# Patient Record
Sex: Female | Born: 1950 | Race: Black or African American | Hispanic: No | Smoking: Former smoker
Health system: Southern US, Community
[De-identification: ages and names within clinical notes are randomized; demographics above are authoritative.]

## PROBLEM LIST (undated history)

## (undated) DIAGNOSIS — J309 Allergic rhinitis, unspecified: Secondary | ICD-10-CM

## (undated) DIAGNOSIS — D1803 Hemangioma of intra-abdominal structures: Secondary | ICD-10-CM

## (undated) DIAGNOSIS — K589 Irritable bowel syndrome without diarrhea: Secondary | ICD-10-CM

## (undated) DIAGNOSIS — R0602 Shortness of breath: Secondary | ICD-10-CM

## (undated) DIAGNOSIS — R600 Localized edema: Secondary | ICD-10-CM

## (undated) DIAGNOSIS — Z860100 Personal history of colon polyps, unspecified: Secondary | ICD-10-CM

## (undated) DIAGNOSIS — R079 Chest pain, unspecified: Secondary | ICD-10-CM

## (undated) DIAGNOSIS — E559 Vitamin D deficiency, unspecified: Secondary | ICD-10-CM

## (undated) DIAGNOSIS — E119 Type 2 diabetes mellitus without complications: Secondary | ICD-10-CM

## (undated) DIAGNOSIS — Z8371 Family history of colonic polyps: Secondary | ICD-10-CM

## (undated) DIAGNOSIS — E669 Obesity, unspecified: Secondary | ICD-10-CM

## (undated) DIAGNOSIS — F329 Major depressive disorder, single episode, unspecified: Secondary | ICD-10-CM

## (undated) DIAGNOSIS — F419 Anxiety disorder, unspecified: Secondary | ICD-10-CM

## (undated) DIAGNOSIS — K449 Diaphragmatic hernia without obstruction or gangrene: Secondary | ICD-10-CM

## (undated) DIAGNOSIS — E876 Hypokalemia: Secondary | ICD-10-CM

## (undated) DIAGNOSIS — E039 Hypothyroidism, unspecified: Secondary | ICD-10-CM

## (undated) DIAGNOSIS — F32A Depression, unspecified: Secondary | ICD-10-CM

## (undated) DIAGNOSIS — Z6830 Body mass index (BMI) 30.0-30.9, adult: Secondary | ICD-10-CM

## (undated) DIAGNOSIS — F338 Other recurrent depressive disorders: Secondary | ICD-10-CM

## (undated) DIAGNOSIS — R609 Edema, unspecified: Secondary | ICD-10-CM

## (undated) DIAGNOSIS — H269 Unspecified cataract: Secondary | ICD-10-CM

## (undated) DIAGNOSIS — E785 Hyperlipidemia, unspecified: Secondary | ICD-10-CM

## (undated) DIAGNOSIS — I10 Essential (primary) hypertension: Secondary | ICD-10-CM

## (undated) DIAGNOSIS — K760 Fatty (change of) liver, not elsewhere classified: Secondary | ICD-10-CM

## (undated) DIAGNOSIS — R5383 Other fatigue: Secondary | ICD-10-CM

## (undated) DIAGNOSIS — I7300 Raynaud's syndrome without gangrene: Secondary | ICD-10-CM

## (undated) DIAGNOSIS — W5911XA Bitten by nonvenomous snake, initial encounter: Secondary | ICD-10-CM

## (undated) DIAGNOSIS — M8580 Other specified disorders of bone density and structure, unspecified site: Secondary | ICD-10-CM

## (undated) DIAGNOSIS — E079 Disorder of thyroid, unspecified: Secondary | ICD-10-CM

## (undated) DIAGNOSIS — H43391 Other vitreous opacities, right eye: Secondary | ICD-10-CM

## (undated) DIAGNOSIS — D126 Benign neoplasm of colon, unspecified: Secondary | ICD-10-CM

## (undated) HISTORY — PX: ROTATOR CUFF REPAIR: SHX139

## (undated) HISTORY — DX: Benign neoplasm of colon, unspecified: D12.6

## (undated) HISTORY — DX: Chest pain, unspecified: R07.9

## (undated) HISTORY — DX: Raynaud's syndrome without gangrene: I73.00

## (undated) HISTORY — DX: Obesity, unspecified: E66.9

## (undated) HISTORY — DX: Fatty (change of) liver, not elsewhere classified: K76.0

## (undated) HISTORY — DX: Edema, unspecified: R60.9

## (undated) HISTORY — DX: Personal history of colonic polyps: Z86.010

## (undated) HISTORY — DX: Other fatigue: R53.83

## (undated) HISTORY — DX: Other specified disorders of bone density and structure, unspecified site: M85.80

## (undated) HISTORY — DX: Hyperlipidemia, unspecified: E78.5

## (undated) HISTORY — DX: Hypomagnesemia: E83.42

## (undated) HISTORY — PX: CARPAL TUNNEL RELEASE: SHX101

## (undated) HISTORY — DX: Anxiety disorder, unspecified: F41.9

## (undated) HISTORY — PX: KNEE SURGERY: SHX244

## (undated) HISTORY — DX: Allergic rhinitis, unspecified: J30.9

## (undated) HISTORY — DX: Shortness of breath: R06.02

## (undated) HISTORY — DX: Type 2 diabetes mellitus without complications: E11.9

## (undated) HISTORY — DX: Other recurrent depressive disorders: F33.8

## (undated) HISTORY — DX: Hypokalemia: E87.6

## (undated) HISTORY — DX: Hypothyroidism, unspecified: E03.9

## (undated) HISTORY — DX: Localized edema: R60.0

## (undated) HISTORY — PX: CHOLECYSTECTOMY: SHX55

## (undated) HISTORY — DX: Body mass index (BMI) 30.0-30.9, adult: Z68.30

## (undated) HISTORY — DX: Hemangioma of intra-abdominal structures: D18.03

## (undated) HISTORY — PX: TUBAL LIGATION: SHX77

## (undated) HISTORY — DX: Irritable bowel syndrome without diarrhea: K58.9

## (undated) HISTORY — DX: Other vitreous opacities, right eye: H43.391

## (undated) HISTORY — DX: Vitamin D deficiency, unspecified: E55.9

## (undated) HISTORY — DX: Personal history of colon polyps, unspecified: Z86.0100

## (undated) HISTORY — DX: Family history of colonic polyps: Z83.71

## (undated) HISTORY — PX: ABDOMINAL HYSTERECTOMY: SHX81

## (undated) HISTORY — DX: Unspecified cataract: H26.9

## (undated) HISTORY — DX: Disorder of thyroid, unspecified: E07.9

---

## 1988-12-08 DIAGNOSIS — I10 Essential (primary) hypertension: Secondary | ICD-10-CM

## 1988-12-08 HISTORY — DX: Essential (primary) hypertension: I10

## 1996-12-08 DIAGNOSIS — E039 Hypothyroidism, unspecified: Secondary | ICD-10-CM | POA: Diagnosis present

## 1998-05-23 ENCOUNTER — Ambulatory Visit (HOSPITAL_COMMUNITY): Admission: RE | Admit: 1998-05-23 | Discharge: 1998-05-23 | Payer: Self-pay | Admitting: *Deleted

## 1998-05-25 ENCOUNTER — Ambulatory Visit (HOSPITAL_COMMUNITY): Admission: RE | Admit: 1998-05-25 | Discharge: 1998-05-25 | Payer: Self-pay | Admitting: *Deleted

## 1998-07-15 ENCOUNTER — Encounter (HOSPITAL_COMMUNITY): Admission: RE | Admit: 1998-07-15 | Discharge: 1998-10-04 | Payer: Self-pay | Admitting: *Deleted

## 1998-12-24 ENCOUNTER — Other Ambulatory Visit: Admission: RE | Admit: 1998-12-24 | Discharge: 1998-12-24 | Payer: Self-pay | Admitting: Oral Surgery

## 1999-01-05 ENCOUNTER — Ambulatory Visit (HOSPITAL_COMMUNITY): Admission: RE | Admit: 1999-01-05 | Discharge: 1999-01-05 | Payer: Self-pay | Admitting: *Deleted

## 1999-11-27 ENCOUNTER — Other Ambulatory Visit: Admission: RE | Admit: 1999-11-27 | Discharge: 1999-11-27 | Payer: Self-pay | Admitting: Obstetrics and Gynecology

## 2002-01-17 ENCOUNTER — Ambulatory Visit (HOSPITAL_COMMUNITY): Admission: RE | Admit: 2002-01-17 | Discharge: 2002-01-17 | Payer: Self-pay | Admitting: Gastroenterology

## 2003-10-09 ENCOUNTER — Encounter: Admission: RE | Admit: 2003-10-09 | Discharge: 2003-10-09 | Payer: Self-pay | Admitting: Family Medicine

## 2004-01-16 ENCOUNTER — Encounter: Admission: RE | Admit: 2004-01-16 | Discharge: 2004-04-15 | Payer: Self-pay | Admitting: Family Medicine

## 2004-05-30 ENCOUNTER — Inpatient Hospital Stay (HOSPITAL_COMMUNITY): Admission: EM | Admit: 2004-05-30 | Discharge: 2004-06-05 | Payer: Self-pay | Admitting: Emergency Medicine

## 2004-07-06 ENCOUNTER — Inpatient Hospital Stay (HOSPITAL_COMMUNITY): Admission: EM | Admit: 2004-07-06 | Discharge: 2004-07-12 | Payer: Self-pay | Admitting: Emergency Medicine

## 2004-07-10 ENCOUNTER — Encounter (INDEPENDENT_AMBULATORY_CARE_PROVIDER_SITE_OTHER): Payer: Self-pay | Admitting: *Deleted

## 2004-07-23 ENCOUNTER — Ambulatory Visit (HOSPITAL_COMMUNITY): Admission: RE | Admit: 2004-07-23 | Discharge: 2004-07-24 | Payer: Self-pay | Admitting: Surgery

## 2004-07-23 ENCOUNTER — Encounter (INDEPENDENT_AMBULATORY_CARE_PROVIDER_SITE_OTHER): Payer: Self-pay | Admitting: *Deleted

## 2005-04-25 ENCOUNTER — Inpatient Hospital Stay (HOSPITAL_COMMUNITY): Admission: EM | Admit: 2005-04-25 | Discharge: 2005-04-29 | Payer: Self-pay | Admitting: Emergency Medicine

## 2005-05-02 ENCOUNTER — Ambulatory Visit (HOSPITAL_COMMUNITY): Admission: RE | Admit: 2005-05-02 | Discharge: 2005-05-02 | Payer: Self-pay | Admitting: Gastroenterology

## 2006-01-14 ENCOUNTER — Encounter: Admission: RE | Admit: 2006-01-14 | Discharge: 2006-01-19 | Payer: Self-pay | Admitting: Family Medicine

## 2006-05-05 IMAGING — CR DG FOOT COMPLETE 3+V*R*
2 series · 2 of 2 positions shown · non-contrast
Comparison: none

CLINICAL DATA: Cellulitis. 
 RIGHT FOOT (THREE VIEWS)

[view not recorded (1 of 2)]
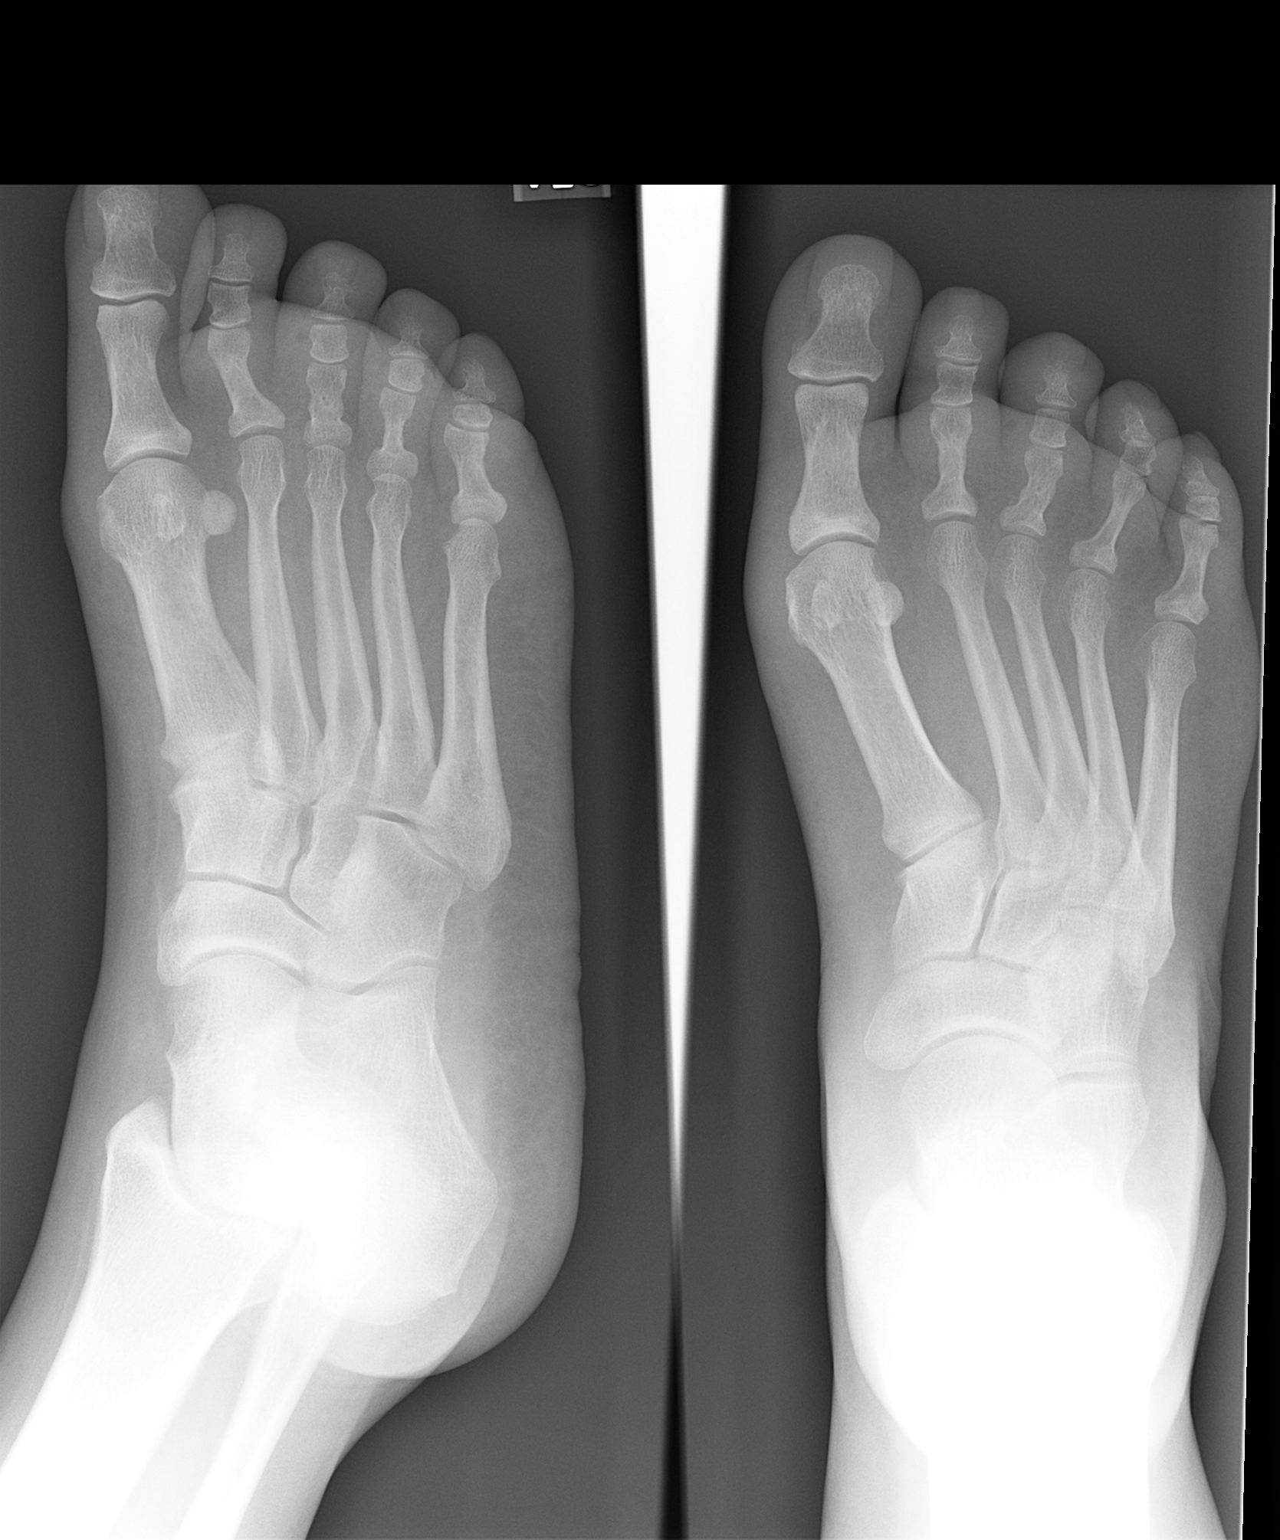

[view not recorded (2 of 2)]
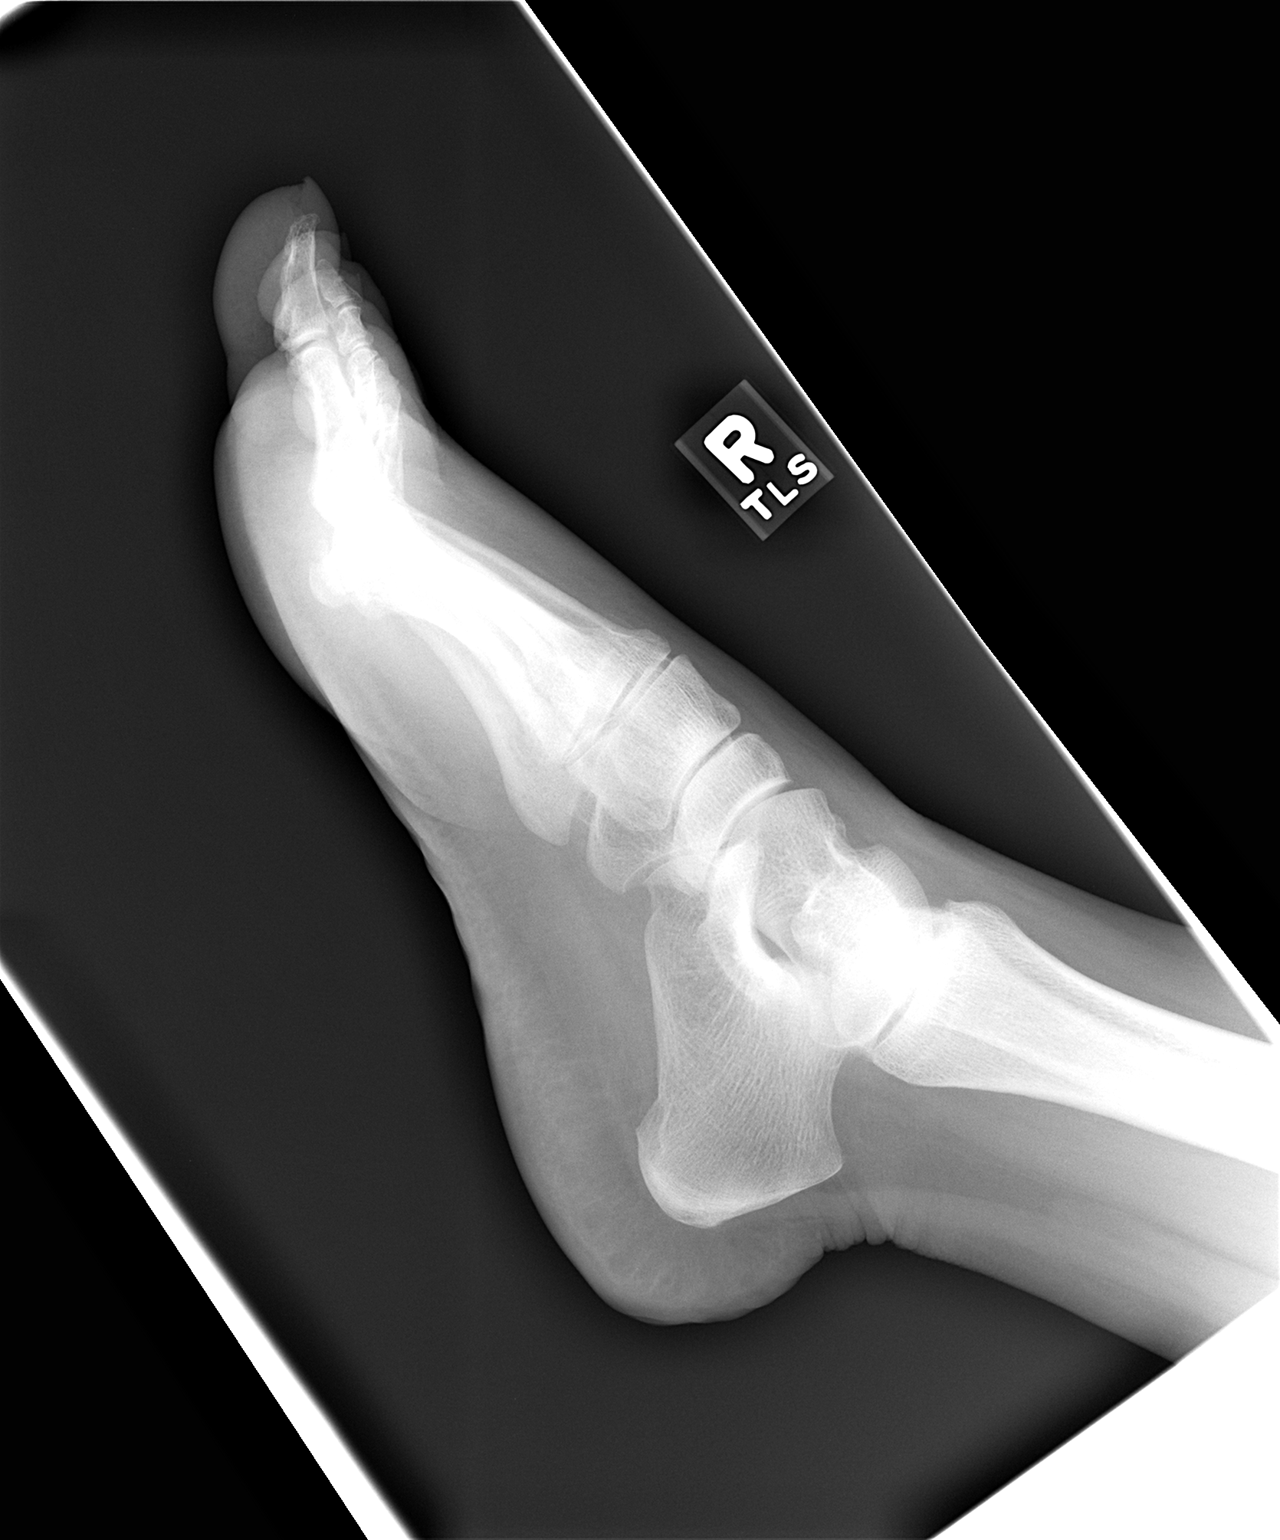

[2 of 2 positions shown; findings below may reference images not displayed]

FINDINGS: There is soft tissue swelling about the second and third digits with what looks like a small bubble of subcutaneous gas medial to the middle phalanx of the third toe.  No definite cortical destruction is identified to indicate osteomyelitis.  No bone abnormality is seen. 
 IMPRESSION
 Soft tissue swelling with question of subcutaneous gas involving the right third toe.  
 No bone abnormality to suggest osteomyelitis.  However MRI and bone scintigraphy are more sensitive studies for early osteomyelitis.

## 2007-01-12 ENCOUNTER — Encounter: Admission: RE | Admit: 2007-01-12 | Discharge: 2007-01-12 | Payer: Self-pay | Admitting: Gastroenterology

## 2007-02-17 ENCOUNTER — Encounter: Admission: RE | Admit: 2007-02-17 | Discharge: 2007-02-17 | Payer: Self-pay | Admitting: Gastroenterology

## 2007-03-22 ENCOUNTER — Ambulatory Visit (HOSPITAL_COMMUNITY): Admission: RE | Admit: 2007-03-22 | Discharge: 2007-03-22 | Payer: Self-pay | Admitting: Gastroenterology

## 2007-10-22 ENCOUNTER — Emergency Department (HOSPITAL_COMMUNITY): Admission: EM | Admit: 2007-10-22 | Discharge: 2007-10-22 | Payer: Self-pay | Admitting: Emergency Medicine

## 2009-10-01 ENCOUNTER — Encounter: Admission: RE | Admit: 2009-10-01 | Discharge: 2009-10-25 | Payer: Self-pay | Admitting: Family Medicine

## 2010-12-28 ENCOUNTER — Encounter: Payer: Self-pay | Admitting: Family Medicine

## 2011-04-25 NOTE — H&P (Signed)
NAME:  Alexis Molina, Alexis Molina              ACCOUNT NO.:  [id number]   MEDICAL RECORD NO.:  [id number]          PATIENT TYPE:  EMS   LOCATION:  ED                           FACILITY:  [facility]   PHYSICIAN:  Hollice Espy, M.D.DATE OF BIRTH:  May 26, 1951   DATE OF ADMISSION:  04/25/2005  DATE OF DISCHARGE:                                HISTORY & PHYSICAL   CHIEF COMPLAINT:  Abdominal pain with nausea and vomiting.   The patient is a 60 year old African-American female with a past medical  history of hypothyroidism, hypertension and diet controlled diabetes who was  in her usual state of health when she started having problems with nausea  and vomiting approximately earlier on in the morning. She tried some  Gaviscon with no relief and pain continued to persist. The pain was located  mostly in the mid epigastric area with radiation to the back and then  eventually migrated over to the right upper quadrant area. The patient  states the pain grew more and more intense with episodes of emesis which she  describes as 10/10. She has not had these type of symptoms since she had her  gallbladder taken out a few years ago. She finally could not take it  anymore, could not keep anything down and came to the emergency room for  further evaluation. Once there, her vitals were stable. She had lab work  which showed a normal white count of 9.9 but did have an 83% shift. Her  lipase level was normal as were her LFT's. She continued to complain of some  severe pain radiating to the back. she may have pancreatitis or a possible  renal stone and she underwent a CT of the abdomen and pelvis with contrast.  CT of the abdomen showed evidence of enteritis with a differential diagnosis  of infectious versus inflammatory versus hemorrhagic. The patient was given  zofran and morphine. Her abdominal pain subsided currently. She says when  she does not move she has no abdominal pain. She is still very nauseated  when  thinking about taking anything p.o. With these concerns, it was felt  that it would best to admit her for a 24 hour observation. She denies any  headaches, visual changes, dysphasia, chest pain, palpitations, shortness of  breath, wheeze, cough. She currently has no abdominal pain if she does not  move. She denies any constipation, diarrhea, hematuria or dysuria. She  denies any shortness of breath. Her review of systems is otherwise negative.   PAST MEDICAL HISTORY:  1.  Diabetes mellitus now diet controlled.  2.  Hypertension.  3.  Hypothyroidism.   MEDICATIONS:  The patient is on Synthroid, lisinopril, triamterene, Maxzide,  Lipitor and Zoloft.   ALLERGIES:  The patient has allergies reportedly to SULFA, PENICILLIN,  VICODIN and orange juice.   SOCIAL HISTORY:  She denied any tobacco, alcohol or drug use.   FAMILY HISTORY:  Noncontributory.   PHYSICAL EXAMINATION:  VITAL SIGNS:  The patient's vitals on admission,  temperature 98.5, heart rate 94, blood pressure 129/89, respirations 20, O2  sat 99% on room air.  GENERAL:  The patient is alert and oriented x3 in no apparent distress.  HEENT:  Normocephalic, atraumatic. Mucous membranes are dry. She has no  carotid bruits.  HEART:  Regular rate and rhythm, S1, S2.  LUNGS:  Clear to auscultation bilaterally.  ABDOMEN:  Soft, nontender unless she has deep palpation, nondistended,  positive bowel sounds.  EXTREMITIES:  No clubbing or cyanosis, trace pitting edema.   LABORATORY DATA:  White count of 9.9 with 83% neutrophils, H&H 13.9 and  41.4. MCV of 82, platelet count 323. Sodium 135, potassium 3.4, chloride 99,  bicarb 28, BUN 9, creatinine 0.8, glucose 132. LFT's are all within normal  limits. Lipase is normal at 26. Urinalysis is unremarkable. An ABG was drawn  but I think that the results were in error. The pH is 7.37, pCO2 of 50, PO2  of 60, bicarb of 28. Reported O2 sats of 89% although the patient has not  had any oxygen  desaturations and has her sats at 99% or greater on room air.   ASSESSMENT/PLAN:  1.  Enteritis likely acute infection. Will put the patient on IV fluids as      well as give antibiotics given that she does have bacterial shift. Will      choose Cipro and Flagyl for gut coverage. Likely would go for a three      day course. Will treat with IV Phenergan for nausea and start with a      clear liquid diet, advance as tolerated. If she is able to keep down a      regular diet by the end of the day, she could be discharged within the      next 24 hours.  2.  Hypothyroidism. Continue Synthroid.  3.  Depression. Continue Zoloft.  4.  Diabetes mellitus, diet controlled. Will keep an eye on her sugars.      SKK/MEDQ  D:  04/25/2005  T:  04/25/2005  Job:  [id number]   cc:   Caryn Bee L. Little, M.D.  [address]  [place]  [place] [address]  Fax: [phone]

## 2011-04-25 NOTE — Consult Note (Signed)
Alexis Molina, Alexis Molina              ACCOUNT NO.:  [id number]   MEDICAL RECORD NO.:  [id number]          PATIENT TYPE:  INP   LOCATION:  0349                         FACILITY:  [facility]   PHYSICIAN:  Petra Kuba, M.D.    DATE OF BIRTH:  1951-10-01   DATE OF CONSULTATION:  04/28/2005  DATE OF DISCHARGE:                                   CONSULTATION   HISTORY:  The patient seen at the request of Jackie Plum, M.D. for  multiple GI complaints. She does have an abnormal CAT scan which I reviewed  with Dwyane Luo. Fischer, M.D. She has had right sided abdominal pain, gas and  bloating after she eats for years, has been worked up in the past by Dr.  Randa Evens. She has had at least one colonoscopy, possibly two, what sounds  like an endoscopy and multiple CT scans as well as an empiric  cholecystectomy for biliary dyskinesia.   PAST MEDICAL HISTORY:  Pertinent for diabetes, hypertension, hypothyroidism.   MEDICATIONS:  Synthroid, lisinopril, triamterene, Maxzide, Lipitor and  Zoloft.   ALLERGIES:  SULFA, PENICILLIN and Vicodin.   SOCIAL HISTORY:  Denies tobacco, alcohol or drug use.   FAMILY HISTORY:  Negative although they all seem to have gas problems.   REVIEW OF SYMPTOMS:  Negative except above.   PHYSICAL EXAMINATION:  VITAL SIGNS:  See chart.  GENERAL:  No acute distress.  ABDOMEN:  Soft, nontender, good bowel sounds, decreased peripheral pulses  bilaterally.   LABORATORY DATA:  Unimpressive. Please see chart.   ASSESSMENT:  1.  Postprandial gas, bloating and pain for years. Questionable irritable      bowel syndrome.  2.  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   PLAN:  Would ask vascular surgery to see if a angiogram is needed and to see  if the celiac abnormality needs more workup. I will review her office chart  to see other workup plans and medicines trials but I believe she has not  been  on antispasmodic  and will try that. Probably okay for outpatient followup.  Probably could stand a small bowel followthrough to complete her GI workup  if she has not had one which I will check the office chart for. Will follow  with you.      MEM/MEDQ  D:  04/28/2005  T:  04/28/2005  Job:  [id number]   cc:   Caryn Bee L. Little, M.D.  [address]  [place]  [place] [address]  Fax: [phone]   Jackie Plum, M.D.

## 2011-04-25 NOTE — Procedures (Signed)
[facility]  Patient:    Alexis Molina, Alexis Molina Visit Number: [id number] MRN: [id number]          Service Type: END Location: ENDO Attending Physician:  Orland Mustard Dictated by:   Llana Aliment. Randa Evens, M.D. Proc. Date: 01/17/02 Admit Date:  01/17/2002   CC:         Caryn Bee L. Little, M.D.   Procedure Report  PROCEDURE:  Colonoscopy.  MEDICATIONS:  Fentanyl 87.5 mcg, Versed 8 mg IV.  INDICATION:  Woman with history of irritable bowel.  She does have a strong family history of colon polyps in her mother.  She has not had any colonic evaluation.  For this reason, colonoscopy is performed.  DESCRIPTION OF PROCEDURE:  The procedure had been explained to the patient and consent obtained.  The patient in the left lateral decubitus position, the Olympus video colonoscope inserted and advanced under direct visualization. The prep was excellent.  We were able to advance to the cecum without difficulty.  The ileocecal valve and appendiceal orifice were seen.  The scope was withdrawn, and the cecum, ascending colon, hepatic flexure, transverse colon, splenic flexure, descending and sigmoid colon were seen well upon removal.  No polyps or other lesions were seen.  The scope was withdrawn down into the rectum.  The rectum was completely normal.  No polyps or other lesions were seen.  The patient tolerated the procedure well and was maintained on low-flow oxygen and pulse oximetry throughout the procedure.  ASSESSMENT:  Essentially normal colonoscopy with no evidence of polyps in this high risk patient.  I suspect she does in fact have irritable bowel disease.  PLAN:  We will go ahead and plan on repeating the procedure in five years and will see back in the office in 2-3 months. Dictated by:   Llana Aliment. Randa Evens, M.D. Attending Physician:  Orland Mustard DD:  01/17/02 TD:  01/17/02 Job: [id number]

## 2011-04-25 NOTE — Discharge Summary (Signed)
NAME:  Alexis Molina, Alexis Molina              ACCOUNT NO.:  [id number]   MEDICAL RECORD NO.:  [id number]          PATIENT TYPE:  INP   LOCATION:  0349                         [facility]ACILITY:  [facility]   PHYSICIAN:  Jackie Plum, M.D.DATE OF BIRTH:  [birth year]   DATE OF ADMISSION:  04/25/2005  DATE OF DISCHARGE:  04/29/2005                                 DISCHARGE SUMMARY   DISCHARGE DIAGNOSES:  1.  Abdominal pain, nausea and vomiting resolved, etiology unclear.  2.  History of diabetes, hypertension and hypothyroidism.   DISCHARGE MEDICATIONS:  1.  Levbid 1-2 before breakfast and dinner.  2.  Phenergan as needed.  3.  She is also to continue her previous medications.   ACTIVITY:  As tolerated.   The patient was asked to scheduled small bowel follow through as instructed.  She will see Dr. Clarene Duke in 2-3 weeks for followup. She is to see Dr. Randa Evens  of GI medicine. Also attends Dr. Hart Rochester of vascular surgery and Dr. Ewing Schlein of  GI medicine. The patient was admitted by Hollice Espy, M.D. after  presenting with abdominal pain with nausea and vomiting. She has had this  intermittent pain for years and actually had a colonoscopy. She had  intermittent abdominal pain for years for which she had a cholecystectomy  done with some improvement; however, her symptoms persisted. On admission by  Hollice Espy, M.D., the patient had a CT scan which showed enteritis  with differential diagnosis of infectious versus inflamatory versus  hemorrhagic etiology. She was hemodynamically stable and her abdomen was  soft without any adverse significant tenderness. She was admitted to the  hospitalist service and started on IV fluid supplementation and antibiotics  for possible bacterial etiology with Cipro and Flagyl. Her labs were  monitored. 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 She did not have any weight loss or diarrhea and was seen  in consultation by Dr. Hart Rochester who thought that it was unlikely to have a  relationship between her celiac stenosis and abdominal pain. With supportive  care, potassium did improve and on evaluation by GI medicine, she was now  prepared for discharge home with outpatient followup on Apr 29, 2005 and was  discharged home on the above medicines. The patient was discharged home in  stable satisfactory condition, pain under control according to last noted by  Hollice Espy, M.D. who discharged patient home.   DISCHARGE LABS:  Sodium 137, potassium 3.7, chloride 101, CO2 29, glucose  129, BUN 7, creatinine 0.9, calcium 8.8.     GO/MEDQ  D:  05/23/2005  T:  05/23/2005  Job:  [id number]

## 2011-04-25 NOTE — Op Note (Signed)
NAME:  Alexis Molina                        ACCOUNT NO.:  [id number]   MEDICAL RECORD NO.:  [id number]                   PATIENT TYPE:  OIB   LOCATION:  2899                                 FACILITY:  [facility]   PHYSICIAN:  Alexis Park. Daphine Molina, M.D.             DATE OF BIRTH:  1951-11-27   DATE OF PROCEDURE:  07/23/2004  DATE OF DISCHARGE:                                 OPERATIVE REPORT   PREOPERATIVE DIAGNOSIS:  Biliary dyskinesia.   POSTOPERATIVE DIAGNOSIS:  Biliary dyskinesia status post laparoscopic  cholecystectomy with intraoperative cholangiogram   PROCEDURE:  Laparoscopic cholecystectomy and intraoperative cholangiogram.   SURGEON:  Alexis Park. Daphine Molina, M.D.   Threasa HeadsEarlene Molina   ANESTHESIA:  General endotracheal.   INDICATIONS:  Alexis Molina is a 60 year old lady seen over at [facility]  where she was recently admitted with some upper abdominal pain.  This has  persisted.  While she was admitted over there, she had a normal ultrasound  but a HIDA scan showing an ejection fraction of approximately 16% right  after stimulation.  Informed consent was obtained.  I was talking to her  first over there but mainly here about laparoscopic cholecystectomy and its  risks, benefits and complications not limited to common duct injury,  bleeding, bile leaks.   DESCRIPTION OF PROCEDURE:  The patient was back to room 17 and given general  anesthesia.  A Foley catheter was inserted, and she was given 400 mg of IV  Cipro.  After prepping with Betadine, I entered the abdomen via the  umbilicus by first excising a keloid down in the umbilicus making along the  way a vertical type incision.  I made the same kind of vertical incision in  the fascia and then used a Hasson cannula to enter bluntly.  The abdomen was  insufflated and three trocars were placed in the upper abdomen.   I surveyed the abdomen and did not see any other obvious problems.  The  gallbladder was grasped, elevated.   The cystic duct area and Calot's  triangle had kind of a wide firmness to it suggesting some chronic  inflammatory changes.  This could be related to her diabetes but it was not  normal.  I dissected off a Molina cystic artery off the duct, double clipped  that, divided it, and then put a clip up on the gallbladder.  I incised the  cystic duct and did a diamond cholangiogram using a Cook catheter, held in  place with a clip and then injecting several mL of dye leading to filling of  the common duct, filling of the intrahepatic radicals, and free flow into  the duodenum with a nice taper to the biliary ampulla.  The cystic duct was  triple clipped, divided.  The gallbladder was then removed from the  gallbladder bed without entering it using the hook electrocautery.  The  gallbladder bed was cauterized as we took  it out, and there were no bleeding  or bowel leaks noted.  I irrigated with copious amounts of saline at the  end, and no bleeding or polyps were noted.  The gallbladder was brought out  through the umbilicus.  The umbilical defect was repaired with three simple  sutures of 0 Vicryl using the laparoscope from the inside, visualizing the  closure and everything appeared intact.  '   We inspected the right upper quadrant once again before finally deflating  the abdomen removing all of the trocars.  I had injected the trocar sites  with the lidocaine on the way in, and these were closed with 4-0 Vicryl with  Benzoin and Steri-Strips.  The patient seemed to tolerate the procedure well  and was taken to the recovery room in satisfactory condition.                                               Alexis Molina, M.D.    MBM/MEDQ  D:  07/23/2004  T:  07/23/2004  Job:  [id number]   cc:   Alexis Molina, M.D.  [address]  [place]  [place] [address]  Fax: [phone]

## 2011-09-16 LAB — COMPREHENSIVE METABOLIC PANEL
ALT: 27
AST: 21
Albumin: 4.1
Alkaline Phosphatase: 62
BUN: 7
CO2: 24
Calcium: 9.6
Chloride: 100
Creatinine, Ser: 0.81
GFR calc Af Amer: >60
GFR calc non Af Amer: >60
Glucose, Bld: 131 — ABNORMAL HIGH
Potassium: 3.6
Sodium: 137
Total Bilirubin: 1.5 — ABNORMAL HIGH
Total Protein: 7.9

## 2011-09-16 LAB — URINE MICROSCOPIC-ADD ON

## 2011-09-16 LAB — CBC
HCT: 39.4
Hemoglobin: 13.6
MCHC: 34.4
MCV: 79.6
Platelets: 346
RBC: 4.95
RDW: 14.8
WBC: 10

## 2011-09-16 LAB — DIFFERENTIAL
Basophils Absolute: 0
Basophils Relative: 0
Eosinophils Absolute: 0.1 — ABNORMAL LOW
Eosinophils Relative: 1
Lymphocytes Relative: 14
Lymphs Abs: 1.4
Monocytes Absolute: 0.8
Monocytes Relative: 8
Neutro Abs: 7.8 — ABNORMAL HIGH
Neutrophils Relative %: 78 — ABNORMAL HIGH

## 2011-09-16 LAB — URINALYSIS, ROUTINE W REFLEX MICROSCOPIC
Bilirubin Urine: NEGATIVE
Glucose, UA: NEGATIVE
Hgb urine dipstick: NEGATIVE
Ketones, ur: NEGATIVE
Nitrite: NEGATIVE
Protein, ur: NEGATIVE
Specific Gravity, Urine: 1.015
Urobilinogen, UA: 0.2
pH: 6.5

## 2013-06-23 ENCOUNTER — Emergency Department (HOSPITAL_COMMUNITY)
Admission: EM | Admit: 2013-06-23 | Discharge: 2013-06-23 | Disposition: A | Discharge status: Home / Self Care | Payer: BC Managed Care – PPO | Attending: Emergency Medicine | Admitting: Emergency Medicine

## 2013-06-23 ENCOUNTER — Emergency Department (HOSPITAL_COMMUNITY): Payer: BC Managed Care – PPO

## 2013-06-23 ENCOUNTER — Encounter (HOSPITAL_COMMUNITY): Payer: Self-pay

## 2013-06-23 DIAGNOSIS — I10 Essential (primary) hypertension: Secondary | ICD-10-CM | POA: Insufficient documentation

## 2013-06-23 DIAGNOSIS — R197 Diarrhea, unspecified: Secondary | ICD-10-CM | POA: Insufficient documentation

## 2013-06-23 DIAGNOSIS — Z9889 Other specified postprocedural states: Secondary | ICD-10-CM | POA: Insufficient documentation

## 2013-06-23 DIAGNOSIS — R1013 Epigastric pain: Secondary | ICD-10-CM

## 2013-06-23 DIAGNOSIS — Z862 Personal history of diseases of the blood and blood-forming organs and certain disorders involving the immune mechanism: Secondary | ICD-10-CM | POA: Insufficient documentation

## 2013-06-23 DIAGNOSIS — Z8719 Personal history of other diseases of the digestive system: Secondary | ICD-10-CM | POA: Insufficient documentation

## 2013-06-23 DIAGNOSIS — E119 Type 2 diabetes mellitus without complications: Secondary | ICD-10-CM | POA: Insufficient documentation

## 2013-06-23 DIAGNOSIS — Z8639 Personal history of other endocrine, nutritional and metabolic disease: Secondary | ICD-10-CM | POA: Insufficient documentation

## 2013-06-23 DIAGNOSIS — K529 Noninfective gastroenteritis and colitis, unspecified: Secondary | ICD-10-CM

## 2013-06-23 DIAGNOSIS — Z8659 Personal history of other mental and behavioral disorders: Secondary | ICD-10-CM | POA: Insufficient documentation

## 2013-06-23 DIAGNOSIS — R110 Nausea: Secondary | ICD-10-CM

## 2013-06-23 DIAGNOSIS — Z87828 Personal history of other (healed) physical injury and trauma: Secondary | ICD-10-CM | POA: Insufficient documentation

## 2013-06-23 DIAGNOSIS — Z90710 Acquired absence of both cervix and uterus: Secondary | ICD-10-CM | POA: Insufficient documentation

## 2013-06-23 DIAGNOSIS — Z9089 Acquired absence of other organs: Secondary | ICD-10-CM | POA: Insufficient documentation

## 2013-06-23 HISTORY — DX: Type 2 diabetes mellitus without complications: E11.9

## 2013-06-23 HISTORY — DX: Diaphragmatic hernia without obstruction or gangrene: K44.9

## 2013-06-23 HISTORY — DX: Essential (primary) hypertension: I10

## 2013-06-23 HISTORY — DX: Major depressive disorder, single episode, unspecified: F32.9

## 2013-06-23 HISTORY — DX: Depression, unspecified: F32.A

## 2013-06-23 HISTORY — DX: Bitten by nonvenomous snake, initial encounter: W59.11XA

## 2013-06-23 LAB — CBC WITH DIFFERENTIAL/PLATELET
Basophils Absolute: 0 10*3/uL (ref 0.0–0.1)
Basophils Relative: 0 % (ref 0–1)
Eosinophils Absolute: 0.1 10*3/uL (ref 0.0–0.7)
Eosinophils Relative: 2 % (ref 0–5)
HCT: 40.1 % (ref 36.0–46.0)
Hemoglobin: 13.8 g/dL (ref 12.0–15.0)
Lymphocytes Relative: 22 % (ref 12–46)
Lymphs Abs: 1.3 10*3/uL (ref 0.7–4.0)
MCH: 26.4 pg (ref 26.0–34.0)
MCHC: 34.4 g/dL (ref 30.0–36.0)
MCV: 76.8 fL — ABNORMAL LOW (ref 78.0–100.0)
Monocytes Absolute: 0.5 10*3/uL (ref 0.1–1.0)
Monocytes Relative: 9 % (ref 3–12)
Neutro Abs: 4 10*3/uL (ref 1.7–7.7)
Neutrophils Relative %: 67 % (ref 43–77)
Platelets: 289 10*3/uL (ref 150–400)
RBC: 5.22 MIL/uL — ABNORMAL HIGH (ref 3.87–5.11)
RDW: 14.1 % (ref 11.5–15.5)
WBC: 6 10*3/uL (ref 4.0–10.5)

## 2013-06-23 LAB — URINALYSIS, ROUTINE W REFLEX MICROSCOPIC
Bilirubin Urine: NEGATIVE
Glucose, UA: NEGATIVE mg/dL
Hgb urine dipstick: NEGATIVE
Ketones, ur: NEGATIVE mg/dL
Nitrite: NEGATIVE
Protein, ur: NEGATIVE mg/dL
Specific Gravity, Urine: 1.014 (ref 1.005–1.030)
Urobilinogen, UA: 0.2 mg/dL (ref 0.0–1.0)
pH: 7 (ref 5.0–8.0)

## 2013-06-23 LAB — COMPREHENSIVE METABOLIC PANEL
ALT: 24 U/L (ref 0–35)
AST: 21 U/L (ref 0–37)
Albumin: 3.8 g/dL (ref 3.5–5.2)
Alkaline Phosphatase: 72 U/L (ref 39–117)
BUN: 10 mg/dL (ref 6–23)
CO2: 27 mEq/L (ref 19–32)
Calcium: 9.6 mg/dL (ref 8.4–10.5)
Chloride: 97 mEq/L (ref 96–112)
Creatinine, Ser: 0.66 mg/dL (ref 0.50–1.10)
GFR calc Af Amer: >90 mL/min (ref >90)
GFR calc non Af Amer: >90 mL/min (ref >90)
Glucose, Bld: 134 mg/dL — ABNORMAL HIGH (ref 70–99)
Potassium: 2.9 mEq/L — ABNORMAL LOW (ref 3.5–5.1)
Sodium: 136 mEq/L (ref 135–145)
Total Bilirubin: 0.7 mg/dL (ref 0.3–1.2)
Total Protein: 7.8 g/dL (ref 6.0–8.3)

## 2013-06-23 LAB — URINE MICROSCOPIC-ADD ON

## 2013-06-23 LAB — TROPONIN I: Troponin I: <0.3 ng/mL (ref <0.30)

## 2013-06-23 LAB — LIPASE, BLOOD: Lipase: 42 U/L (ref 11–59)

## 2013-06-23 MED ORDER — POTASSIUM CHLORIDE 20 MEQ/15ML (10%) PO LIQD
40.0000 meq | Freq: Once | ORAL | Status: AC
  Administered 2013-06-23: 40 meq via ORAL
  Filled 2013-06-23: qty 30

## 2013-06-23 MED ORDER — METOCLOPRAMIDE HCL 5 MG/ML IJ SOLN
10.0000 mg | Freq: Once | INTRAMUSCULAR | Status: AC
  Administered 2013-06-23: 10 mg via INTRAVENOUS
  Filled 2013-06-23: qty 2

## 2013-06-23 MED ORDER — HYDROCODONE-ACETAMINOPHEN 5-325 MG PO TABS: 1.0000 | ORAL_TABLET | Freq: Four times a day (QID) | ORAL | Status: DC | PRN

## 2013-06-23 MED ORDER — ONDANSETRON 4 MG PO TBDP: 4.0000 mg | ORAL_TABLET | Freq: Once | ORAL | Status: DC

## 2013-06-23 MED ORDER — MORPHINE SULFATE 4 MG/ML IJ SOLN
4.0000 mg | Freq: Once | INTRAMUSCULAR | Status: AC
  Administered 2013-06-23: 4 mg via INTRAVENOUS
  Filled 2013-06-23: qty 1

## 2013-06-23 MED ORDER — MORPHINE SULFATE 10 MG/ML IJ SOLN: 10.0000 mg | Freq: Once | INTRAMUSCULAR | Status: DC

## 2013-06-23 MED ORDER — POTASSIUM CHLORIDE 10 MEQ/100ML IV SOLN
10.0000 meq | Freq: Once | INTRAVENOUS | Status: AC
  Administered 2013-06-23: 10 meq via INTRAVENOUS
  Filled 2013-06-23: qty 100

## 2013-06-23 MED ORDER — ONDANSETRON HCL 4 MG PO TABS: 4.0000 mg | ORAL_TABLET | Freq: Three times a day (TID) | ORAL | Status: DC | PRN

## 2013-06-23 MED ORDER — GI COCKTAIL ~~LOC~~
30.0000 mL | Freq: Once | ORAL | Status: AC
  Administered 2013-06-23: 30 mL via ORAL
  Filled 2013-06-23: qty 30

## 2013-06-23 MED ORDER — ONDANSETRON HCL 4 MG/2ML IJ SOLN
4.0000 mg | Freq: Once | INTRAMUSCULAR | Status: AC
  Administered 2013-06-23: 4 mg via INTRAVENOUS
  Filled 2013-06-23: qty 2

## 2013-06-23 NOTE — ED Provider Notes (Signed)
History    CSN: [id number] Arrival date & time 06/23/13  1711  First MD Initiated Contact with Patient 06/23/13 1727     Chief Complaint  Patient presents with  . Abdominal Pain   (Consider location/radiation/quality/duration/timing/severity/associated sxs/prior Treatment) HPI  Alexis Molina is a(n) 62 y.o. female who presents with chief complaint of abdominal pain and nausea.  Past medical history of diabetes, hiatal hernia, hypercholesterolemia and hypertension.  Patient states that over the past 2 weeks she has had increasing epigastric and left upper quadrant abdominal pain with nausea.  She has had this chronically and it is usually relieved by Hyoscyamine. Her symptoms became unbearable today she came to the emergency department for evaluation.  Patient has had profuse watery diarrhea over the past 2 days.  She states that she normally has diarrhea.  She is a past surgical history of cholecystectomy.  She states that since that time she has continued to have epigastric and left upper quadrant pain.  She states it is normally intermittent and relieved with medications previously stated. She denies contacts with similar sxs, ingestion of suspect foods or water, recent foreign travel . Denies fevers, chills, myalgias, arthralgias. Denies DOE, SOB, chest tightness or pressure, radiation to left arm, jaw or back, or diaphoresis. Denies dysuria, flank pain, suprapubic pain, frequency, urgency, or hematuria. Denies headaches, light headedness, weakness, visual disturbances.      Past Medical History  Diagnosis Date  . Diabetes mellitus without complication   . Hiatal hernia   . Hypertension   . Snake bite   . Depression    Past Surgical History  Procedure Laterality Date  . Cholecystectomy    . Abdominal hysterectomy    . Carpal tunnel release    . Knee surgery     History reviewed. No pertinent family history. History  Substance Use Topics  . Smoking status: Never Smoker    . Smokeless tobacco: Never Used  . Alcohol Use: No   OB History   Grav Para Term Preterm Abortions TAB SAB Ect Mult Living                 Review of Systems Ten systems reviewed and are negative for acute change, except as noted in the HPI.   Allergies  Review of patient's allergies indicates not on file.  Home Medications  No current outpatient prescriptions on file. BP 126/75  Pulse 90  Temp(Src) 98.1 F (36.7 C) (Oral)  Resp 20  SpO2 98% Physical Exam Physical Exam  Nursing note and vitals reviewed. Constitutional: She is oriented to person, place, and time. She appears well-developed and well-nourished. Tearful and appears uncomfortable HENT:  Head: Normocephalic and atraumatic.  Eyes: Conjunctivae normal and EOM are normal. Pupils are equal, round, and reactive to light. No scleral icterus.  Neck: Normal range of motion.  Cardiovascular: Normal rate, regular rhythm and normal heart sounds.  Exam reveals no gallop and no friction rub.   No murmur heard. Pulmonary/Chest: Effort normal and breath sounds normal. No respiratory distress.  Abdominal: Soft. Bowel sounds are normal. exquisitely ttp LUQ and epigastrium Neurological: She is alert and oriented to person, place, and time.  Skin: Skin is warm and dry. She is not diaphoretic.    ED Course  Procedures (including critical care time) Labs Reviewed  CBC WITH DIFFERENTIAL - Abnormal; Notable for the following:    RBC 5.22 (*)    MCV 76.8 (*)    All other components within normal limits  COMPREHENSIVE  METABOLIC PANEL - Abnormal; Notable for the following:    Potassium 2.9 (*)    Glucose, Bld 134 (*)    All other components within normal limits  LIPASE, BLOOD  TROPONIN I  URINALYSIS, ROUTINE W REFLEX MICROSCOPIC   No results found. 1. Epigastric pain   2. Nausea   3. Chronic diarrhea      Date: 06/23/2013  Rate: 74  Rhythm: normal sinus rhythm  QRS Axis: normal  Intervals: normal  ST/T Wave  abnormalities: normal  Conduction Disutrbances: none  Narrative Interpretation:   Old EKG Reviewed: No significant changes noted    MDM  BP 126/75  Pulse 90  Temp(Src) 98.1 F (36.7 C) (Oral)  Resp 20  SpO2 98% Patient with normal vs. Epigastric pain and nausea. Diff includes. Pancreatitis, gastric ulcer, gastroparesis, gastroenteritis, atypical presentation of ACS.    6:59 PM BP 126/75  Pulse 90  Temp(Src) 98.1 F (36.7 C) (Oral)  Resp 20  SpO2 98% Patient appears more relaxed. Speaking in full sentences. She complains that her stomach still hurts although the pain is less severe. Patient is hypokalemic to 2.9.  She will be given oral potassium and 1 run IV potassium 10 mEq, suspect secondary to diarrhea.   8:01 PM Patient tolerating PO fluids. Still getting IV potassium. She is followed by DR.Edwards in gastroenterology. She will be given antinausea medications and pain medicine to go home with. She should follow up with her GI doctor for further testing. I have given report to PA Schinlever who will assume car of the patient  Arthor Captain, PA-C 06/23/13 2013

## 2013-06-23 NOTE — ED Notes (Signed)
Patient reports that she has had upper epigastric pain that radiates to her right mid back x 1 month. Patient c/o nausea, but no vomiting. Patient reports multiple episodes of yellow diarrhea and no blood in her stool.

## 2013-06-23 NOTE — ED Provider Notes (Signed)
Pt received from [place], PA-C.  Pt received IV potassium.  Her pain is currently controlled and she is tolerating pos.  She has a gastroenterologist to f/u with.  D/c'd home.  Return precautions reiterated.  10:23 PM   Otilio Miu, PA-C 06/23/13 2223

## 2013-06-23 NOTE — Progress Notes (Signed)
Patient reports her pcp is Dr. Catha Gosselin.

## 2013-06-27 NOTE — ED Provider Notes (Signed)
Medical screening examination/treatment/procedure(s) were performed by non-physician practitioner and as supervising physician I was immediately available for consultation/collaboration.   Suzi Roots, MD 06/27/13 [phone]

## 2013-06-27 NOTE — ED Provider Notes (Signed)
Medical screening examination/treatment/procedure(s) were performed by non-physician practitioner and as supervising physician I was immediately available for consultation/collaboration.   Suzi Roots, MD 06/27/13 [phone]

## 2013-07-11 ENCOUNTER — Other Ambulatory Visit: Payer: Self-pay | Admitting: Gastroenterology

## 2013-07-11 DIAGNOSIS — R109 Unspecified abdominal pain: Secondary | ICD-10-CM

## 2013-07-13 ENCOUNTER — Ambulatory Visit
Admission: RE | Admit: 2013-07-13 | Discharge: 2013-07-13 | Disposition: A | Discharge status: Home / Self Care | Payer: BC Managed Care – PPO | Source: Ambulatory Visit | Attending: Gastroenterology | Admitting: Gastroenterology

## 2013-07-13 DIAGNOSIS — R109 Unspecified abdominal pain: Secondary | ICD-10-CM

## 2013-07-13 MED ORDER — IOHEXOL 300 MG/ML  SOLN
125.0000 mL | Freq: Once | INTRAMUSCULAR | Status: AC | PRN
  Administered 2013-07-13: 125 mL via INTRAVENOUS

## 2014-08-29 ENCOUNTER — Other Ambulatory Visit: Payer: Self-pay | Admitting: Gastroenterology

## 2014-08-29 DIAGNOSIS — D1803 Hemangioma of intra-abdominal structures: Secondary | ICD-10-CM

## 2014-09-07 ENCOUNTER — Ambulatory Visit
Admission: RE | Admit: 2014-09-07 | Discharge: 2014-09-07 | Disposition: A | Discharge status: Home / Self Care | Payer: BC Managed Care – PPO | Source: Ambulatory Visit | Attending: Gastroenterology | Admitting: Gastroenterology

## 2014-09-07 DIAGNOSIS — D1803 Hemangioma of intra-abdominal structures: Secondary | ICD-10-CM

## 2015-05-11 ENCOUNTER — Encounter: Payer: Self-pay | Admitting: Interventional Cardiology

## 2015-05-11 ENCOUNTER — Ambulatory Visit (INDEPENDENT_AMBULATORY_CARE_PROVIDER_SITE_OTHER): Payer: BC Managed Care – PPO | Admitting: Interventional Cardiology

## 2015-05-11 VITALS — BP 114/72 | HR 85 | Ht 62.0 in | Wt 174.8 lb

## 2015-05-11 DIAGNOSIS — E1169 Type 2 diabetes mellitus with other specified complication: Secondary | ICD-10-CM

## 2015-05-11 DIAGNOSIS — R0789 Other chest pain: Secondary | ICD-10-CM | POA: Diagnosis not present

## 2015-05-11 DIAGNOSIS — Z8249 Family history of ischemic heart disease and other diseases of the circulatory system: Secondary | ICD-10-CM | POA: Diagnosis not present

## 2015-05-11 DIAGNOSIS — R002 Palpitations: Secondary | ICD-10-CM | POA: Diagnosis not present

## 2015-05-11 DIAGNOSIS — E119 Type 2 diabetes mellitus without complications: Secondary | ICD-10-CM

## 2015-05-11 HISTORY — DX: Family history of ischemic heart disease and other diseases of the circulatory system: Z82.49

## 2015-05-11 HISTORY — DX: Type 2 diabetes mellitus with other specified complication: E11.69

## 2015-05-11 NOTE — Patient Instructions (Signed)
Medication Instructions:  None  Labwork: None  Testing/Procedures: Your physician has recommended that you wear an event monitor. DX:  30 FOR PALPITATIONS Event monitors are medical devices that record the heart's electrical activity. Doctors most often [place] these monitors to diagnose arrhythmias. Arrhythmias are problems with the speed or rhythm of the heartbeat. The monitor is a small, portable device. You can wear one while you do your normal daily activities. This is usually used to diagnose what is causing palpitations/syncope (passing out).    Follow-Up:  To be determined  Any Other Special Instructions Will Be Listed Below (If Applicable)   Patient will think about a stress test compared to catherization/to be determined

## 2015-05-11 NOTE — Progress Notes (Signed)
Patient ID: Alexis Molina, female   DOB: 1951-08-15, 64 y.o.   MRN: [id number]     Cardiology Office Note   Date:  05/11/2015   ID:  COLIN NORMENT, DOB [birth year], MRN [id number]  PCP:  Gennette Pac, MD    No chief complaint on file. chest discomfort   Wt Readings from Last 3 Encounters:  05/11/15 174 lb 12.8 oz (79.289 kg)       History of Present Illness: Alexis Molina is a 64 y.o. female  Who has had chest discomfort in the center of her chest.  Sx started within the last year.  It has been getting worse.  When she walks, she feels a chest pain after her heart rate speeds up.  It is more of a pressure only when she feels the heart beating fast.  SHe feels that she can massage the area and the sx get better.  She walks daily to her mailbox.  If she walks fast, it will affect her.  If she walks slow, then she feels ok.  She cut caffeine and there was no difference. No sweating, arm pain, numbness tingling associated with the chest discomfort.    Several cousins have had stents.  She has several brothers, none who have had heart problems.    She had a stress test many years ago.    She had a back problem a few years ago and could not exercise on the treadmill due to stress.   She uses a recumbent bike and will have to stop after 5 minutes due to chest pain.   Yesterday, she had a fast heart rate and chest pain while sitting.    CP always comes on after HR speeding up.    Past Medical History  Diagnosis Date  . Diabetes mellitus without complication   . Hiatal hernia   . Hypertension   . Snake bite   . Depression   . Hyperlipidemia   . Fatty liver   . Cataract   . IBS (irritable bowel syndrome)   . Thyroid disease   . Peripheral edema   . Anxiety   . Chest pain     Past Surgical History  Procedure Laterality Date  . Cholecystectomy    . Abdominal hysterectomy    . Carpal tunnel release    . Knee surgery       Current Outpatient Prescriptions    Medication Sig Dispense Refill  . acidophilus (RISAQUAD) CAPS capsule Take 1 capsule by mouth daily.    Marland Kitchen atorvastatin (LIPITOR) 80 MG tablet Take 80 mg by mouth daily.    Marland Kitchen azelastine (ASTELIN) 0.1 % nasal spray Place 1 spray into both nostrils 2 (two) times daily. Use in each nostril as directed    . fexofenadine (ALLEGRA) 180 MG tablet Take 180 mg by mouth daily.    Marland Kitchen levothyroxine (SYNTHROID, LEVOTHROID) 75 MCG tablet Take 75 mcg by mouth daily before breakfast.    . lisinopril (PRINIVIL,ZESTRIL) 20 MG tablet Take 20 mg by mouth daily.    . ondansetron (ZOFRAN) 4 MG tablet Take 1 tablet (4 mg total) by mouth every 8 (eight) hours as needed for nausea. 10 tablet 0  . sertraline (ZOLOFT) 50 MG tablet Take 50 mg by mouth daily.    Marland Kitchen triamterene-hydrochlorothiazide (MAXZIDE) 75-50 MG per tablet Take 1 tablet by mouth daily.     No current facility-administered medications for this visit.    Allergies:   Darvocet; Orange fruit; Vicodin; Dust  mite extract; Glimepiride; Penicillins; and Sulfa antibiotics    Social History:  The patient  reports that she has never smoked. She has never used smokeless tobacco. She reports that she does not drink alcohol or use illicit drugs.   Family History:  The patient's**family history includes Alzheimer's disease in her mother; Arthritis in her father; Diabetes in her mother; Heart failure in her father; Hypertension in her father and mother; Lung cancer in her brother; Ulcers in her father.    ROS:  Please see the history of present illness.   Otherwise, review of systems are positive for palpitations, chest pain.   All other systems are reviewed and negative.    PHYSICAL EXAM: VS:  BP 114/72 mmHg  Pulse 85  Ht 5\' 2"  (1.575 m)  Wt 174 lb 12.8 oz (79.289 kg)  BMI 31.96 kg/m2  SpO2 96% , BMI Body mass index is 31.96 kg/(m^2). GEN: Well nourished, well developed, in no acute distress HEENT: normal Neck: no JVD, carotid bruits, or masses Cardiac:  RRR; no murmurs, rubs, or gallops,no edema  Respiratory:  clear to auscultation bilaterally, normal work of breathing GI: soft, nontender, nondistended, + BS MS: no deformity or atrophy Skin: warm and dry, no rash Neuro:  Strength and sensation are intact Psych: euthymic mood, full affect   EKG:   The ekg ordered today demonstrates normal sinus rhythm, no ST segment changes   Recent Labs: No results found for requested labs within last 365 days.   Lipid Panel No results found for: CHOL, TRIG, HDL, CHOLHDL, VLDL, LDLCALC, LDLDIRECT   Other studies Reviewed: Additional studies/ records that were reviewed today with results demonstrating: Monitor in 2013 showed normal sinus rhythm with PACs..   ASSESSMENT AND PLAN:  1. Chest discomfort: Occurs with fast heart rates. These can occur while she is sitting at rest or particularly with walking or riding her stationary bike. She has stopped exercising due to chest discomfort. She needs to be evaluated for ischemia. We discussed stress testing versus cardiac cath. She wants to think about her options and decide which test to do.  The risks and benefits of both procedures were explained to the patient. 2. Palpitations: We'll repeat event monitor. What she describes sounds like could be SVT. 3. Diabetes: This is her most significant risk factor for CAD. 4. Early family history of CAD: Multiple cousins with stents who are significantly younger than her.   Current medicines are reviewed at length with the patient today.  The patient concerns regarding her medicines were addressed.  The following changes have been made:  No change  Labs/ tests ordered today include: Monitor  No orders of the defined types were placed in this encounter.    Recommend 150 minutes/week of aerobic exercise Low fat, low carb, high fiber diet recommended  Disposition:   FU in after the monitor   Teresita Madura., MD  05/11/2015 3:10 PM    [facility] [place], [place], [place]  [address] Phone: [phone]; Fax: [phone]

## 2015-05-21 ENCOUNTER — Ambulatory Visit (INDEPENDENT_AMBULATORY_CARE_PROVIDER_SITE_OTHER): Payer: BC Managed Care – PPO

## 2015-05-21 DIAGNOSIS — R002 Palpitations: Secondary | ICD-10-CM

## 2015-06-29 ENCOUNTER — Telehealth: Payer: Self-pay | Admitting: Interventional Cardiology

## 2015-06-29 NOTE — Telephone Encounter (Signed)
Follow Up    Pt is returning call from earlier this week. Please call.

## 2015-07-24 ENCOUNTER — Encounter: Payer: Self-pay | Admitting: *Deleted

## 2015-07-25 ENCOUNTER — Other Ambulatory Visit (INDEPENDENT_AMBULATORY_CARE_PROVIDER_SITE_OTHER): Payer: BC Managed Care – PPO | Admitting: *Deleted

## 2015-07-25 ENCOUNTER — Other Ambulatory Visit: Payer: Self-pay | Admitting: *Deleted

## 2015-07-25 DIAGNOSIS — Z01812 Encounter for preprocedural laboratory examination: Secondary | ICD-10-CM

## 2015-07-25 LAB — BASIC METABOLIC PANEL
BUN: 12 mg/dL (ref 6–23)
CO2: 32 mEq/L (ref 19–32)
Calcium: 9.4 mg/dL (ref 8.4–10.5)
Chloride: 102 mEq/L (ref 96–112)
Creatinine, Ser: 0.87 mg/dL (ref 0.40–1.20)
GFR: 84.49 mL/min (ref >60.00)
Glucose, Bld: 111 mg/dL — ABNORMAL HIGH (ref 70–99)
Potassium: 3.3 mEq/L — ABNORMAL LOW (ref 3.5–5.1)
Sodium: 141 mEq/L (ref 135–145)

## 2015-07-25 LAB — CBC
HCT: 40 % (ref 36.0–46.0)
Hemoglobin: 13.3 g/dL (ref 12.0–15.0)
MCHC: 33.3 g/dL (ref 30.0–36.0)
MCV: 81.8 fl (ref 78.0–100.0)
Platelets: 303 10*3/uL (ref 150.0–400.0)
RBC: 4.89 Mil/uL (ref 3.87–5.11)
RDW: 14.4 % (ref 11.5–15.5)
WBC: 4.3 10*3/uL (ref 4.0–10.5)

## 2015-07-25 LAB — PROTIME-INR
INR: 1 ratio (ref 0.8–1.0)
Prothrombin Time: 10.8 s (ref 9.6–13.1)

## 2015-07-26 ENCOUNTER — Other Ambulatory Visit: Payer: Self-pay | Admitting: *Deleted

## 2015-07-26 ENCOUNTER — Telehealth: Payer: Self-pay | Admitting: *Deleted

## 2015-07-26 MED ORDER — POTASSIUM CHLORIDE CRYS ER 20 MEQ PO TBCR: 40.0000 meq | EXTENDED_RELEASE_TABLET | Freq: Every day | ORAL | Status: DC

## 2015-07-26 NOTE — Telephone Encounter (Signed)
-----   Message from Alexis Booze, MD sent at 07/26/2015  9:18 AM EDT ----- It is important for the cath to have the potassium in the normal range. Taking potassium should be short term.  I would go ahead with the 48mEq daily until the cath.  She may not need it after the cath.

## 2015-07-26 NOTE — Telephone Encounter (Signed)
Spoke with pt and informed her that Dr. Irish Lack said it was important to have K+ in normal range for cath and that taking it should be short term. Advised pt that Dr. Irish Lack would like for her to take the 40mEq QD until the cath. Pt read off to me that she had Klor Con 29mEq at home. Verified pharmacy and sent in prescription for K+ 68mEq QD. Pt verbalized understanding and was in agreement with this plan.

## 2015-07-27 ENCOUNTER — Other Ambulatory Visit: Payer: Self-pay | Admitting: Interventional Cardiology

## 2015-07-27 DIAGNOSIS — I209 Angina pectoris, unspecified: Secondary | ICD-10-CM

## 2015-07-30 ENCOUNTER — Ambulatory Visit (HOSPITAL_COMMUNITY)
Admission: RE | Admit: 2015-07-30 | Discharge: 2015-07-30 | Disposition: A | Discharge status: Home / Self Care | Payer: BC Managed Care – PPO | Source: Ambulatory Visit | Attending: Interventional Cardiology | Admitting: Interventional Cardiology

## 2015-07-30 ENCOUNTER — Encounter (HOSPITAL_COMMUNITY): Payer: Self-pay | Admitting: Interventional Cardiology

## 2015-07-30 ENCOUNTER — Encounter (HOSPITAL_COMMUNITY)
Admission: RE | Disposition: A | Discharge status: Home / Self Care | Payer: Self-pay | Source: Ambulatory Visit | Attending: Interventional Cardiology

## 2015-07-30 DIAGNOSIS — Z880 Allergy status to penicillin: Secondary | ICD-10-CM | POA: Diagnosis not present

## 2015-07-30 DIAGNOSIS — F329 Major depressive disorder, single episode, unspecified: Secondary | ICD-10-CM | POA: Diagnosis not present

## 2015-07-30 DIAGNOSIS — Z882 Allergy status to sulfonamides status: Secondary | ICD-10-CM | POA: Insufficient documentation

## 2015-07-30 DIAGNOSIS — R600 Localized edema: Secondary | ICD-10-CM | POA: Diagnosis not present

## 2015-07-30 DIAGNOSIS — I209 Angina pectoris, unspecified: Secondary | ICD-10-CM | POA: Insufficient documentation

## 2015-07-30 DIAGNOSIS — E119 Type 2 diabetes mellitus without complications: Secondary | ICD-10-CM | POA: Diagnosis not present

## 2015-07-30 DIAGNOSIS — F419 Anxiety disorder, unspecified: Secondary | ICD-10-CM | POA: Diagnosis not present

## 2015-07-30 DIAGNOSIS — K449 Diaphragmatic hernia without obstruction or gangrene: Secondary | ICD-10-CM | POA: Insufficient documentation

## 2015-07-30 DIAGNOSIS — I10 Essential (primary) hypertension: Secondary | ICD-10-CM | POA: Insufficient documentation

## 2015-07-30 DIAGNOSIS — E785 Hyperlipidemia, unspecified: Secondary | ICD-10-CM | POA: Insufficient documentation

## 2015-07-30 DIAGNOSIS — K589 Irritable bowel syndrome without diarrhea: Secondary | ICD-10-CM | POA: Diagnosis not present

## 2015-07-30 DIAGNOSIS — E079 Disorder of thyroid, unspecified: Secondary | ICD-10-CM | POA: Insufficient documentation

## 2015-07-30 HISTORY — PX: CARDIAC CATHETERIZATION: SHX172

## 2015-07-30 LAB — BASIC METABOLIC PANEL
Anion gap: 9 (ref 5–15)
BUN: 13 mg/dL (ref 6–20)
CO2: 28 mmol/L (ref 22–32)
Calcium: 9.3 mg/dL (ref 8.9–10.3)
Chloride: 105 mmol/L (ref 101–111)
Creatinine, Ser: 0.78 mg/dL (ref 0.44–1.00)
GFR calc Af Amer: >60 mL/min (ref >60)
GFR calc non Af Amer: >60 mL/min (ref >60)
Glucose, Bld: 131 mg/dL — ABNORMAL HIGH (ref 65–99)
Potassium: 3.9 mmol/L (ref 3.5–5.1)
Sodium: 142 mmol/L (ref 135–145)

## 2015-07-30 LAB — GLUCOSE, CAPILLARY: Glucose-Capillary: 115 mg/dL — ABNORMAL HIGH (ref 65–99)

## 2015-07-30 SURGERY — LEFT HEART CATH AND CORONARY ANGIOGRAPHY
Anesthesia: LOCAL

## 2015-07-30 MED ORDER — SODIUM CHLORIDE 0.9 % IJ SOLN: 3.0000 mL | INTRAMUSCULAR | Status: DC | PRN

## 2015-07-30 MED ORDER — IOHEXOL 350 MG/ML SOLN
INTRAVENOUS | Status: DC | PRN
  Administered 2015-07-30: 100 mL via INTRAVENOUS

## 2015-07-30 MED ORDER — MIDAZOLAM HCL 2 MG/2ML IJ SOLN
INTRAMUSCULAR | Status: DC | PRN
  Administered 2015-07-30: 2 mg via INTRAVENOUS
  Administered 2015-07-30: 1 mg via INTRAVENOUS

## 2015-07-30 MED ORDER — HEPARIN SODIUM (PORCINE) 1000 UNIT/ML IJ SOLN
INTRAMUSCULAR | Status: AC
  Filled 2015-07-30: qty 1

## 2015-07-30 MED ORDER — LIDOCAINE HCL (PF) 1 % IJ SOLN
INTRAMUSCULAR | Status: AC
  Filled 2015-07-30: qty 30

## 2015-07-30 MED ORDER — FENTANYL CITRATE (PF) 100 MCG/2ML IJ SOLN
INTRAMUSCULAR | Status: AC
  Filled 2015-07-30: qty 4

## 2015-07-30 MED ORDER — SODIUM CHLORIDE 0.9 % WEIGHT BASED INFUSION
3.0000 mL/kg/h | INTRAVENOUS | Status: DC
  Administered 2015-07-30: 3 mL/kg/h via INTRAVENOUS

## 2015-07-30 MED ORDER — SODIUM CHLORIDE 0.9 % IV SOLN: 250.0000 mL | INTRAVENOUS | Status: DC | PRN

## 2015-07-30 MED ORDER — FENTANYL CITRATE (PF) 100 MCG/2ML IJ SOLN
INTRAMUSCULAR | Status: DC | PRN
  Administered 2015-07-30 (×2): 25 ug via INTRAVENOUS

## 2015-07-30 MED ORDER — NITROGLYCERIN 1 MG/10 ML FOR IR/CATH LAB
INTRA_ARTERIAL | Status: DC | PRN
  Administered 2015-07-30: 08:00:00

## 2015-07-30 MED ORDER — SODIUM CHLORIDE 0.9 % IJ SOLN: 3.0000 mL | Freq: Two times a day (BID) | INTRAMUSCULAR | Status: DC

## 2015-07-30 MED ORDER — SODIUM CHLORIDE 0.9 % WEIGHT BASED INFUSION: 1.0000 mL/kg/h | INTRAVENOUS | Status: DC

## 2015-07-30 MED ORDER — VERAPAMIL HCL 2.5 MG/ML IV SOLN
INTRAVENOUS | Status: DC | PRN
  Administered 2015-07-30: 08:00:00 via INTRA_ARTERIAL

## 2015-07-30 MED ORDER — MIDAZOLAM HCL 2 MG/2ML IJ SOLN
INTRAMUSCULAR | Status: AC
  Filled 2015-07-30: qty 4

## 2015-07-30 MED ORDER — ASPIRIN 81 MG PO CHEW
81.0000 mg | CHEWABLE_TABLET | ORAL | Status: AC
  Administered 2015-07-30: 81 mg via ORAL

## 2015-07-30 MED ORDER — HEPARIN SODIUM (PORCINE) 1000 UNIT/ML IJ SOLN
INTRAMUSCULAR | Status: DC | PRN
  Administered 2015-07-30: 4000 [IU] via INTRAVENOUS

## 2015-07-30 MED ORDER — LIDOCAINE HCL (PF) 1 % IJ SOLN
INTRAMUSCULAR | Status: DC | PRN
  Administered 2015-07-30: 2 mL

## 2015-07-30 MED ORDER — HEPARIN (PORCINE) IN NACL 2-0.9 UNIT/ML-% IJ SOLN
INTRAMUSCULAR | Status: AC
  Filled 2015-07-30: qty 1500

## 2015-07-30 MED ORDER — VERAPAMIL HCL 2.5 MG/ML IV SOLN
INTRAVENOUS | Status: AC
  Filled 2015-07-30: qty 2

## 2015-07-30 MED ORDER — ASPIRIN 81 MG PO CHEW
CHEWABLE_TABLET | ORAL | Status: DC
Start: 2015-07-30 — End: 2015-07-30
  Filled 2015-07-30: qty 1

## 2015-07-30 SURGICAL SUPPLY — 14 items
CATH INFINITI 5 FR 3DRC (CATHETERS) ×1 IMPLANT
CATH INFINITI 5 FR JL3.5 (CATHETERS) ×2 IMPLANT
CATH INFINITI 5FR AL1 (CATHETERS) ×1 IMPLANT
CATH INFINITI 5FR ANG PIGTAIL (CATHETERS) ×2 IMPLANT
CATH INFINITI JR4 5F (CATHETERS) ×2 IMPLANT
DEVICE RAD COMP TR BAND LRG (VASCULAR PRODUCTS) ×2 IMPLANT
GLIDESHEATH SLEND SS 6F .021 (SHEATH) ×2 IMPLANT
KIT HEART LEFT (KITS) ×2 IMPLANT
PACK CARDIAC CATHETERIZATION (CUSTOM PROCEDURE TRAY) ×2 IMPLANT
SYR MEDRAD MARK V 150ML (SYRINGE) ×2 IMPLANT
TRANSDUCER W/STOPCOCK (MISCELLANEOUS) ×2 IMPLANT
TUBING CIL FLEX 10 FLL-RA (TUBING) ×2 IMPLANT
WIRE HITORQ VERSACORE ST 145CM (WIRE) ×1 IMPLANT
WIRE SAFE-T 1.5MM-J .035X260CM (WIRE) ×2 IMPLANT

## 2015-07-30 NOTE — H&P (View-Only) (Signed)
Alexis Molina is an 64 y.o. female.   Primary Cardiologist: PMD: Chief Complaint: CP HPI: Alexis Molina is a 64 y.o. female Who has had chest discomfort in the center of her chest. Sx started within the last year. It has been getting worse. When she walks, she feels a chest pain after her heart rate speeds up. It is more of a pressure only when she feels the heart beating fast. SHe feels that she can massage the area and the sx get better. She walks daily to her mailbox. If she walks fast, it will affect her. If she walks slow, then she feels ok. She cut caffeine and there was no difference. No sweating, arm pain, numbness tingling associated with the chest discomfort.   Several cousins have had stents. She has several brothers, none who have had heart problems.   She had a stress test many years ago.   She is now agreeable to cath.  Past Medical History  Diagnosis Date  . Diabetes mellitus without complication   . Hiatal hernia   . Hypertension   . Snake bite   . Depression   . Hyperlipidemia   . Fatty liver   . Cataract   . IBS (irritable bowel syndrome)   . Thyroid disease   . Peripheral edema   . Anxiety   . Chest pain     Past Surgical History  Procedure Laterality Date  . Cholecystectomy    . Abdominal hysterectomy    . Carpal tunnel release    . Knee surgery      Family History  Problem Relation Age of Onset  . Alzheimer's disease Mother   . Diabetes Mother   . Hypertension Mother   . Ulcers Father   . Hypertension Father   . Arthritis Father   . Heart failure Father   . Lung cancer Brother    Social History:  reports that she has never smoked. She has never used smokeless tobacco. She reports that she does not drink alcohol or use illicit drugs.  Allergies:  Allergies  Allergen Reactions  . Darvocet [Propoxyphene N-Acetaminophen] Other (See Comments)    Asthma attack  . Orange Fruit [Citrus] Itching and Swelling  . Vicodin  [Hydrocodone-Acetaminophen] Hives and Itching  . Dust Mite Extract Itching  . Glimepiride Itching  . Penicillins Hives  . Sulfa Antibiotics Hives    Medications Prior to Admission  Medication Sig Dispense Refill  . atorvastatin (LIPITOR) 80 MG tablet Take 80 mg by mouth daily at 6 PM.     . azelastine (ASTELIN) 0.1 % nasal spray Place 1 spray into both nostrils 2 (two) times daily as needed for rhinitis. Use in each nostril as directed    . fexofenadine (ALLEGRA ALLERGY) 180 MG tablet Take 180 mg by mouth daily.    . Lactobacillus (DIGESTIVE HEALTH PROBIOTIC) CAPS Take 1 capsule by mouth daily.    Marland Kitchen levothyroxine (SYNTHROID, LEVOTHROID) 75 MCG tablet Take 75 mcg by mouth daily before breakfast.    . lisinopril (PRINIVIL,ZESTRIL) 20 MG tablet Take 20 mg by mouth daily.    . potassium chloride SA (K-DUR,KLOR-CON) 20 MEQ tablet Take 2 tablets (40 mEq total) by mouth daily. 60 tablet 2  . sertraline (ZOLOFT) 50 MG tablet Take 50 mg by mouth daily.    Marland Kitchen triamterene-hydrochlorothiazide (MAXZIDE) 75-50 MG per tablet Take 1 tablet by mouth daily.    . ondansetron (ZOFRAN) 4 MG tablet Take 1 tablet (4 mg total) by mouth every  8 (eight) hours as needed for nausea. (Patient not taking: Reported on 07/26/2015) 10 tablet 0    No results found for this or any previous visit (from the past 48 hour(s)). No results found.  ROS: As aboev. Significant for chest pain  OBJECTIVE:   Vitals:   Filed Vitals:   07/30/15 0535  BP: 130/70  Pulse: 63  Temp: 98.5 F (36.9 C)  TempSrc: Oral  Resp: 18  Height: 5\' 2"  (1.575 m)  Weight: 170 lb (77.111 kg)  SpO2: 100%   I&O's:  No intake or output data in the 24 hours ending 07/30/15 0700 :     PHYSICAL EXAM General: Well developed, well nourished, in no acute distress Head:   Normal cephalic and atramatic  Lungs:   Clear bilaterally to auscultation. Heart:   HRRR S1 S2  No JVD.   Abdomen: abdomen soft and non-tender Msk:  Back normal,  Normal  strength and tone for age. Extremities:   No edema.   Neuro: Alert and oriented. Psych:  Normal affect, responds appropriately  LABS: Basic Metabolic Panel: No results for input(s): NA, K, CL, CO2, GLUCOSE, BUN, CREATININE, CALCIUM, MG, PHOS in the last 72 hours. Liver Function Tests: No results for input(s): AST, ALT, ALKPHOS, BILITOT, PROT, ALBUMIN in the last 72 hours. No results for input(s): LIPASE, AMYLASE in the last 72 hours. CBC: No results for input(s): WBC, NEUTROABS, HGB, HCT, MCV, PLT in the last 72 hours. Cardiac Enzymes: No results for input(s): CKTOTAL, CKMB, CKMBINDEX, TROPONINI in the last 72 hours. BNP: Invalid input(s): POCBNP D-Dimer: No results for input(s): DDIMER in the last 72 hours. Hemoglobin A1C: No results for input(s): HGBA1C in the last 72 hours. Fasting Lipid Panel: No results for input(s): CHOL, HDL, LDLCALC, TRIG, CHOLHDL, LDLDIRECT in the last 72 hours. Thyroid Function Tests: No results for input(s): TSH, T4TOTAL, T3FREE, THYROIDAB in the last 72 hours.  Invalid input(s): FREET3 Anemia Panel: No results for input(s): VITAMINB12, FOLATE, FERRITIN, TIBC, IRON, RETICCTPCT in the last 72 hours. Coag Panel:   Lab Results  Component Value Date   INR 1.0 07/25/2015       Assessment/Plan Anginal sx. Given family history, plan for cath.  She is agreeable after some thought and talking to her familty.  The patient understands that risks include but are not limited to stroke (1 in 1000), death (1 in 45), kidney failure [usually temporary] (1 in 500), bleeding (1 in 200), allergic reaction [possibly serious] (1 in 200), and agrees to proceed.    Stokes Rattigan S. 07/30/2015, 7:00 AM

## 2015-07-30 NOTE — Interval H&P Note (Signed)
Cath Lab Visit (complete for each Cath Lab visit)  Clinical Evaluation Leading to the Procedure:   ACS: No.  Non-ACS:    Anginal Classification: CCS III  Anti-ischemic medical therapy: Minimal Therapy (1 class of medications)  Non-Invasive Test Results: No non-invasive testing performed  Prior CABG: No previous CABG  Ischemic Symptoms? CCS III (Marked limitation of ordinary activity) Anti-ischemic Medical Therapy? Minimal Therapy (1 class of medications) Non-invasive Test Results? No non-invasive testing performed Prior CABG? No Previous CABG   Patient Information:   1-2V CAD, no prox LAD  A (7)  Indication: 20; Score: 7   Patient Information:   1-2V-CAD with DS 50-60% With No FFR, No IVUS  I (3)  Indication: 21; Score: 3   Patient Information:   1-2V-CAD with DS 50-60% With FFR  A (7)  Indication: 22; Score: 7   Patient Information:   1-2V-CAD with DS 50-60% With FFR>0.8, IVUS not significant  I (2)  Indication: 23; Score: 2   Patient Information:   3V-CAD without LMCA With Abnormal LV systolic function  A (9)  Indication: 48; Score: 9   Patient Information:   LMCA-CAD  A (9)  Indication: 49; Score: 9   Patient Information:   2V-CAD with prox LAD PCI  A (7)  Indication: 62; Score: 7   Patient Information:   2V-CAD with prox LAD CABG  A (8)  Indication: 62; Score: 8   Patient Information:   3V-CAD without LMCA With Low CAD burden(i.e., 3 focal stenoses, low SYNTAX score) PCI  A (7)  Indication: 63; Score: 7   Patient Information:   3V-CAD without LMCA With Low CAD burden(i.e., 3 focal stenoses, low SYNTAX score) CABG  A (9)  Indication: 63; Score: 9   Patient Information:   3V-CAD without LMCA E06c - Intermediate-high CAD burden (i.e., multiple diffuse lesions, presence of CTO, or high SYNTAX score) PCI  U (4)  Indication: 64; Score: 4   Patient Information:   3V-CAD without LMCA E06c - Intermediate-high  CAD burden (i.e., multiple diffuse lesions, presence of CTO, or high SYNTAX score) CABG  A (9)  Indication: 64; Score: 9   Patient Information:   LMCA-CAD With Isolated LMCA stenosis  PCI  U (6)  Indication: 65; Score: 6   Patient Information:   LMCA-CAD With Isolated LMCA stenosis  CABG  A (9)  Indication: 65; Score: 9   Patient Information:   LMCA-CAD Additional CAD, low CAD burden (i.e., 1- to 2-vessel additional involvement, low SYNTAX score) PCI  U (5)  Indication: 66; Score: 5   Patient Information:   LMCA-CAD Additional CAD, low CAD burden (i.e., 1- to 2-vessel additional involvement, low SYNTAX score) CABG  A (9)  Indication: 66; Score: 9   Patient Information:   LMCA-CAD Additional CAD, intermediate-high CAD burden (i.e., 3-vessel involvement, presence of CTO, or high SYNTAX score) PCI  I (3)  Indication: 67; Score: 3   Patient Information:   LMCA-CAD Additional CAD, intermediate-high CAD burden (i.e., 3-vessel involvement, presence of CTO, or high SYNTAX score) CABG  A (9)  Indication: 67; Score: 9      History and Physical Interval Note:  07/30/2015 7:38 AM  Alexis Molina  has presented today for surgery, with the diagnosis of c/p  The various methods of treatment have been discussed with the patient and family. After consideration of risks, benefits and other options for treatment, the patient has consented to  Procedure(s): Left Heart Cath and Coronary Angiography (N/A)  as a surgical intervention .  The patient's history has been reviewed, patient examined, no change in status, stable for surgery.  I have reviewed the patient's chart and labs.  Questions were answered to the patient's satisfaction.     Tori Dattilio S.

## 2015-07-30 NOTE — Progress Notes (Signed)
Alexis Molina is an 64 y.o. female.   Primary Cardiologist: PMD: Chief Complaint: CP HPI: Alexis Molina is a 64 y.o. female Who has had chest discomfort in the center of her chest. Sx started within the last year. It has been getting worse. When she walks, she feels a chest pain after her heart rate speeds up. It is more of a pressure only when she feels the heart beating fast. SHe feels that she can massage the area and the sx get better. She walks daily to her mailbox. If she walks fast, it will affect her. If she walks slow, then she feels ok. She cut caffeine and there was no difference. No sweating, arm pain, numbness tingling associated with the chest discomfort.   Several cousins have had stents. She has several brothers, none who have had heart problems.   She had a stress test many years ago.   She is now agreeable to cath.  Past Medical History  Diagnosis Date  . Diabetes mellitus without complication   . Hiatal hernia   . Hypertension   . Snake bite   . Depression   . Hyperlipidemia   . Fatty liver   . Cataract   . IBS (irritable bowel syndrome)   . Thyroid disease   . Peripheral edema   . Anxiety   . Chest pain     Past Surgical History  Procedure Laterality Date  . Cholecystectomy    . Abdominal hysterectomy    . Carpal tunnel release    . Knee surgery      Family History  Problem Relation Age of Onset  . Alzheimer's disease Mother   . Diabetes Mother   . Hypertension Mother   . Ulcers Father   . Hypertension Father   . Arthritis Father   . Heart failure Father   . Lung cancer Brother    Social History:  reports that she has never smoked. She has never used smokeless tobacco. She reports that she does not drink alcohol or use illicit drugs.  Allergies:  Allergies  Allergen Reactions  . Darvocet [Propoxyphene N-Acetaminophen] Other (See Comments)    Asthma attack  . Orange Fruit [Citrus] Itching and Swelling  . Vicodin  [Hydrocodone-Acetaminophen] Hives and Itching  . Dust Mite Extract Itching  . Glimepiride Itching  . Penicillins Hives  . Sulfa Antibiotics Hives    Medications Prior to Admission  Medication Sig Dispense Refill  . atorvastatin (LIPITOR) 80 MG tablet Take 80 mg by mouth daily at 6 PM.     . azelastine (ASTELIN) 0.1 % nasal spray Place 1 spray into both nostrils 2 (two) times daily as needed for rhinitis. Use in each nostril as directed    . fexofenadine (ALLEGRA ALLERGY) 180 MG tablet Take 180 mg by mouth daily.    . Lactobacillus (DIGESTIVE HEALTH PROBIOTIC) CAPS Take 1 capsule by mouth daily.    Marland Kitchen levothyroxine (SYNTHROID, LEVOTHROID) 75 MCG tablet Take 75 mcg by mouth daily before breakfast.    . lisinopril (PRINIVIL,ZESTRIL) 20 MG tablet Take 20 mg by mouth daily.    . potassium chloride SA (K-DUR,KLOR-CON) 20 MEQ tablet Take 2 tablets (40 mEq total) by mouth daily. 60 tablet 2  . sertraline (ZOLOFT) 50 MG tablet Take 50 mg by mouth daily.    Marland Kitchen triamterene-hydrochlorothiazide (MAXZIDE) 75-50 MG per tablet Take 1 tablet by mouth daily.    . ondansetron (ZOFRAN) 4 MG tablet Take 1 tablet (4 mg total) by mouth every  8 (eight) hours as needed for nausea. (Patient not taking: Reported on 07/26/2015) 10 tablet 0    No results found for this or any previous visit (from the past 48 hour(s)). No results found.  ROS: As aboev. Significant for chest pain  OBJECTIVE:   Vitals:   Filed Vitals:   07/30/15 0535  BP: 130/70  Pulse: 63  Temp: 98.5 F (36.9 C)  TempSrc: Oral  Resp: 18  Height: 5\' 2"  (1.575 m)  Weight: 170 lb (77.111 kg)  SpO2: 100%   I&O's:  No intake or output data in the 24 hours ending 07/30/15 0700 :     PHYSICAL EXAM General: Well developed, well nourished, in no acute distress Head:   Normal cephalic and atramatic  Lungs:   Clear bilaterally to auscultation. Heart:   HRRR S1 S2  No JVD.   Abdomen: abdomen soft and non-tender Msk:  Back normal,  Normal  strength and tone for age. Extremities:   No edema.   Neuro: Alert and oriented. Psych:  Normal affect, responds appropriately  LABS: Basic Metabolic Panel: No results for input(s): NA, K, CL, CO2, GLUCOSE, BUN, CREATININE, CALCIUM, MG, PHOS in the last 72 hours. Liver Function Tests: No results for input(s): AST, ALT, ALKPHOS, BILITOT, PROT, ALBUMIN in the last 72 hours. No results for input(s): LIPASE, AMYLASE in the last 72 hours. CBC: No results for input(s): WBC, NEUTROABS, HGB, HCT, MCV, PLT in the last 72 hours. Cardiac Enzymes: No results for input(s): CKTOTAL, CKMB, CKMBINDEX, TROPONINI in the last 72 hours. BNP: Invalid input(s): POCBNP D-Dimer: No results for input(s): DDIMER in the last 72 hours. Hemoglobin A1C: No results for input(s): HGBA1C in the last 72 hours. Fasting Lipid Panel: No results for input(s): CHOL, HDL, LDLCALC, TRIG, CHOLHDL, LDLDIRECT in the last 72 hours. Thyroid Function Tests: No results for input(s): TSH, T4TOTAL, T3FREE, THYROIDAB in the last 72 hours.  Invalid input(s): FREET3 Anemia Panel: No results for input(s): VITAMINB12, FOLATE, FERRITIN, TIBC, IRON, RETICCTPCT in the last 72 hours. Coag Panel:   Lab Results  Component Value Date   INR 1.0 07/25/2015       Assessment/Plan Anginal sx. Given family history, plan for cath.  She is agreeable after some thought and talking to her familty.  The patient understands that risks include but are not limited to stroke (1 in 1000), death (1 in 31), kidney failure [usually temporary] (1 in 500), bleeding (1 in 200), allergic reaction [possibly serious] (1 in 200), and agrees to proceed.    VARANASI,JAYADEEP S. 07/30/2015, 7:00 AM

## 2015-07-30 NOTE — Discharge Instructions (Signed)
Radial Site Care °Refer to this sheet in the next few weeks. These instructions provide you with information on caring for yourself after your procedure. Your caregiver may also give you more specific instructions. Your treatment has been planned according to current medical practices, but problems sometimes occur. Call your caregiver if you have any problems or questions after your procedure. °HOME CARE INSTRUCTIONS °· You may shower the day after the procedure. Remove the bandage (dressing) and gently wash the site with plain soap and water. Gently pat the site dry. °· Do not apply powder or lotion to the site. °· Do not submerge the affected site in water for 3 to 5 days. °· Inspect the site at least twice daily. °· Do not flex or bend the affected arm for 24 hours. °· No lifting over 5 pounds (2.3 kg) for 5 days after your procedure. °· Do not drive home if you are discharged the same day of the procedure. Have someone else drive you. °· You may drive 24 hours after the procedure unless otherwise instructed by your caregiver. °· Do not operate machinery or power tools for 24 hours. °· A responsible adult should be with you for the first 24 hours after you arrive home. °What to expect: °· Any bruising will usually fade within 1 to 2 weeks. °· Blood that collects in the tissue (hematoma) may be painful to the touch. It should usually decrease in size and tenderness within 1 to 2 weeks. °SEEK IMMEDIATE MEDICAL CARE IF: °· You have unusual pain at the radial site. °· You have redness, warmth, swelling, or pain at the radial site. °· You have drainage (other than a small amount of blood on the dressing). °· You have chills. °· You have a fever or persistent symptoms for more than 72 hours. °· You have a fever and your symptoms suddenly get worse. °· Your arm becomes pale, cool, tingly, or numb. °· You have heavy bleeding from the site. Hold pressure on the site. °Document Released: 12/27/2010 Document Revised:  02/16/2012 Document Reviewed: 12/27/2010 °[employer]® Patient Information ©2015 [employer]. This information is not intended to replace advice given to you by your health care provider. Make sure you discuss any questions you have with your health care provider. ° °

## 2016-01-24 DIAGNOSIS — M65332 Trigger finger, left middle finger: Secondary | ICD-10-CM

## 2016-01-24 HISTORY — DX: Trigger finger, left middle finger: M65.332

## 2016-03-19 ENCOUNTER — Ambulatory Visit
Admission: RE | Admit: 2016-03-19 | Discharge: 2016-03-19 | Disposition: A | Discharge status: Home / Self Care | Payer: BC Managed Care – PPO | Source: Ambulatory Visit | Attending: Physician Assistant | Admitting: Physician Assistant

## 2016-03-19 ENCOUNTER — Other Ambulatory Visit: Payer: Self-pay | Admitting: Physician Assistant

## 2016-03-19 DIAGNOSIS — R0789 Other chest pain: Secondary | ICD-10-CM

## 2016-03-19 DIAGNOSIS — R0781 Pleurodynia: Secondary | ICD-10-CM

## 2016-06-03 DIAGNOSIS — G5602 Carpal tunnel syndrome, left upper limb: Secondary | ICD-10-CM

## 2016-06-03 HISTORY — DX: Carpal tunnel syndrome, left upper limb: G56.02

## 2016-09-18 DIAGNOSIS — G8929 Other chronic pain: Secondary | ICD-10-CM

## 2016-09-18 DIAGNOSIS — M25512 Pain in left shoulder: Secondary | ICD-10-CM | POA: Insufficient documentation

## 2016-09-18 HISTORY — DX: Pain in left shoulder: M25.512

## 2016-09-18 HISTORY — DX: Other chronic pain: G89.29

## 2016-10-20 ENCOUNTER — Other Ambulatory Visit: Payer: Self-pay | Admitting: Orthopedic Surgery

## 2016-10-20 DIAGNOSIS — M75102 Unspecified rotator cuff tear or rupture of left shoulder, not specified as traumatic: Secondary | ICD-10-CM

## 2016-10-26 ENCOUNTER — Ambulatory Visit
Admission: RE | Admit: 2016-10-26 | Discharge: 2016-10-26 | Disposition: A | Discharge status: Home / Self Care | Payer: BC Managed Care – PPO | Source: Ambulatory Visit | Attending: Orthopedic Surgery | Admitting: Orthopedic Surgery

## 2016-10-26 DIAGNOSIS — M75102 Unspecified rotator cuff tear or rupture of left shoulder, not specified as traumatic: Secondary | ICD-10-CM

## 2016-12-15 DIAGNOSIS — M75102 Unspecified rotator cuff tear or rupture of left shoulder, not specified as traumatic: Secondary | ICD-10-CM

## 2016-12-15 DIAGNOSIS — M542 Cervicalgia: Secondary | ICD-10-CM

## 2016-12-15 HISTORY — DX: Unspecified rotator cuff tear or rupture of left shoulder, not specified as traumatic: M75.102

## 2016-12-15 HISTORY — DX: Cervicalgia: M54.2

## 2016-12-16 ENCOUNTER — Other Ambulatory Visit: Payer: Self-pay | Admitting: Orthopedic Surgery

## 2016-12-16 DIAGNOSIS — M542 Cervicalgia: Secondary | ICD-10-CM

## 2016-12-22 ENCOUNTER — Ambulatory Visit
Admission: RE | Admit: 2016-12-22 | Discharge: 2016-12-22 | Disposition: A | Discharge status: Home / Self Care | Payer: BC Managed Care – PPO | Source: Ambulatory Visit | Attending: Orthopedic Surgery | Admitting: Orthopedic Surgery

## 2016-12-22 DIAGNOSIS — M542 Cervicalgia: Secondary | ICD-10-CM

## 2017-01-28 DIAGNOSIS — I209 Angina pectoris, unspecified: Secondary | ICD-10-CM | POA: Insufficient documentation

## 2017-01-28 HISTORY — DX: Angina pectoris, unspecified: I20.9

## 2017-03-17 ENCOUNTER — Other Ambulatory Visit: Payer: Self-pay | Admitting: Orthopedic Surgery

## 2017-03-17 DIAGNOSIS — M25512 Pain in left shoulder: Secondary | ICD-10-CM

## 2017-03-20 ENCOUNTER — Ambulatory Visit
Admission: RE | Admit: 2017-03-20 | Discharge: 2017-03-20 | Disposition: A | Discharge status: Home / Self Care | Payer: Medicare Other | Source: Ambulatory Visit | Attending: Orthopedic Surgery | Admitting: Orthopedic Surgery

## 2017-03-20 DIAGNOSIS — M25512 Pain in left shoulder: Secondary | ICD-10-CM

## 2017-03-22 ENCOUNTER — Other Ambulatory Visit: Payer: BC Managed Care – PPO

## 2017-04-07 DIAGNOSIS — Z9889 Other specified postprocedural states: Secondary | ICD-10-CM

## 2017-04-07 HISTORY — DX: Other specified postprocedural states: Z98.890

## 2018-02-18 ENCOUNTER — Other Ambulatory Visit (HOSPITAL_COMMUNITY): Payer: Self-pay | Admitting: Gastroenterology

## 2018-02-18 DIAGNOSIS — R1011 Right upper quadrant pain: Secondary | ICD-10-CM

## 2018-02-18 DIAGNOSIS — R112 Nausea with vomiting, unspecified: Secondary | ICD-10-CM

## 2018-03-01 ENCOUNTER — Encounter (HOSPITAL_COMMUNITY)
Admission: RE | Admit: 2018-03-01 | Discharge: 2018-03-01 | Disposition: A | Discharge status: Home / Self Care | Payer: Medicare Other | Source: Ambulatory Visit | Attending: Gastroenterology | Admitting: Gastroenterology

## 2018-03-01 DIAGNOSIS — R112 Nausea with vomiting, unspecified: Secondary | ICD-10-CM | POA: Diagnosis present

## 2018-03-01 DIAGNOSIS — R1011 Right upper quadrant pain: Secondary | ICD-10-CM

## 2018-03-01 MED ORDER — TECHNETIUM TC 99M SULFUR COLLOID
2.0000 | Freq: Once | INTRAVENOUS | Status: AC | PRN
  Administered 2018-03-01: 2 via ORAL

## 2018-10-21 DIAGNOSIS — K760 Fatty (change of) liver, not elsewhere classified: Secondary | ICD-10-CM | POA: Diagnosis not present

## 2018-10-21 DIAGNOSIS — R1084 Generalized abdominal pain: Secondary | ICD-10-CM | POA: Diagnosis not present

## 2018-10-21 DIAGNOSIS — H6123 Impacted cerumen, bilateral: Secondary | ICD-10-CM | POA: Diagnosis not present

## 2018-10-21 DIAGNOSIS — Z79899 Other long term (current) drug therapy: Secondary | ICD-10-CM | POA: Diagnosis not present

## 2018-10-21 DIAGNOSIS — E039 Hypothyroidism, unspecified: Secondary | ICD-10-CM | POA: Diagnosis not present

## 2018-10-21 DIAGNOSIS — E1169 Type 2 diabetes mellitus with other specified complication: Secondary | ICD-10-CM | POA: Diagnosis not present

## 2018-10-21 DIAGNOSIS — R8290 Unspecified abnormal findings in urine: Secondary | ICD-10-CM | POA: Diagnosis not present

## 2018-10-21 DIAGNOSIS — I10 Essential (primary) hypertension: Secondary | ICD-10-CM | POA: Diagnosis not present

## 2018-10-21 DIAGNOSIS — Z1159 Encounter for screening for other viral diseases: Secondary | ICD-10-CM | POA: Diagnosis not present

## 2018-10-21 DIAGNOSIS — E785 Hyperlipidemia, unspecified: Secondary | ICD-10-CM | POA: Diagnosis not present

## 2018-10-21 DIAGNOSIS — Z23 Encounter for immunization: Secondary | ICD-10-CM | POA: Diagnosis not present

## 2018-10-21 DIAGNOSIS — Z0000 Encounter for general adult medical examination without abnormal findings: Secondary | ICD-10-CM | POA: Diagnosis not present

## 2018-10-26 ENCOUNTER — Other Ambulatory Visit: Payer: Self-pay | Admitting: Family Medicine

## 2018-10-26 DIAGNOSIS — R1084 Generalized abdominal pain: Secondary | ICD-10-CM

## 2018-10-28 DIAGNOSIS — Z90710 Acquired absence of both cervix and uterus: Secondary | ICD-10-CM | POA: Diagnosis not present

## 2018-10-28 DIAGNOSIS — Z8262 Family history of osteoporosis: Secondary | ICD-10-CM | POA: Diagnosis not present

## 2018-10-28 DIAGNOSIS — Z1231 Encounter for screening mammogram for malignant neoplasm of breast: Secondary | ICD-10-CM | POA: Diagnosis not present

## 2018-10-28 DIAGNOSIS — Z780 Asymptomatic menopausal state: Secondary | ICD-10-CM | POA: Diagnosis not present

## 2018-10-28 DIAGNOSIS — M8589 Other specified disorders of bone density and structure, multiple sites: Secondary | ICD-10-CM | POA: Diagnosis not present

## 2018-11-01 ENCOUNTER — Ambulatory Visit
Admission: RE | Admit: 2018-11-01 | Discharge: 2018-11-01 | Disposition: A | Discharge status: Home / Self Care | Payer: Medicare Other | Source: Ambulatory Visit | Attending: Family Medicine | Admitting: Family Medicine

## 2018-11-01 ENCOUNTER — Other Ambulatory Visit: Payer: Self-pay | Admitting: Family Medicine

## 2018-11-01 DIAGNOSIS — D179 Benign lipomatous neoplasm, unspecified: Secondary | ICD-10-CM | POA: Diagnosis not present

## 2018-11-01 DIAGNOSIS — R1084 Generalized abdominal pain: Secondary | ICD-10-CM

## 2018-11-01 DIAGNOSIS — K7689 Other specified diseases of liver: Secondary | ICD-10-CM | POA: Diagnosis not present

## 2018-11-01 MED ORDER — IOPAMIDOL (ISOVUE-370) INJECTION 76%
100.0000 mL | Freq: Once | INTRAVENOUS | Status: AC | PRN
  Administered 2018-11-01: 100 mL via INTRAVENOUS

## 2018-11-09 ENCOUNTER — Other Ambulatory Visit: Payer: Self-pay | Admitting: Family Medicine

## 2018-11-09 DIAGNOSIS — R935 Abnormal findings on diagnostic imaging of other abdominal regions, including retroperitoneum: Secondary | ICD-10-CM

## 2018-11-20 ENCOUNTER — Ambulatory Visit
Admission: RE | Admit: 2018-11-20 | Discharge: 2018-11-20 | Disposition: A | Discharge status: Home / Self Care | Payer: Medicare HMO | Source: Ambulatory Visit | Attending: Family Medicine | Admitting: Family Medicine

## 2018-11-20 DIAGNOSIS — K769 Liver disease, unspecified: Secondary | ICD-10-CM | POA: Diagnosis not present

## 2018-11-20 DIAGNOSIS — R6889 Other general symptoms and signs: Secondary | ICD-10-CM | POA: Diagnosis not present

## 2018-11-20 DIAGNOSIS — R935 Abnormal findings on diagnostic imaging of other abdominal regions, including retroperitoneum: Secondary | ICD-10-CM

## 2018-11-20 MED ORDER — GADOBENATE DIMEGLUMINE 529 MG/ML IV SOLN
14.0000 mL | Freq: Once | INTRAVENOUS | Status: AC | PRN
  Administered 2018-11-20: 14 mL via INTRAVENOUS

## 2018-12-10 DIAGNOSIS — K219 Gastro-esophageal reflux disease without esophagitis: Secondary | ICD-10-CM | POA: Diagnosis not present

## 2018-12-10 DIAGNOSIS — R159 Full incontinence of feces: Secondary | ICD-10-CM | POA: Diagnosis not present

## 2018-12-10 DIAGNOSIS — R152 Fecal urgency: Secondary | ICD-10-CM | POA: Diagnosis not present

## 2018-12-10 DIAGNOSIS — K30 Functional dyspepsia: Secondary | ICD-10-CM | POA: Diagnosis not present

## 2019-01-06 DIAGNOSIS — R6889 Other general symptoms and signs: Secondary | ICD-10-CM | POA: Diagnosis not present

## 2019-01-06 DIAGNOSIS — R159 Full incontinence of feces: Secondary | ICD-10-CM | POA: Diagnosis not present

## 2019-01-06 DIAGNOSIS — Z01818 Encounter for other preprocedural examination: Secondary | ICD-10-CM | POA: Diagnosis not present

## 2019-01-06 DIAGNOSIS — R152 Fecal urgency: Secondary | ICD-10-CM | POA: Diagnosis not present

## 2019-01-06 DIAGNOSIS — E119 Type 2 diabetes mellitus without complications: Secondary | ICD-10-CM | POA: Diagnosis not present

## 2019-01-13 DIAGNOSIS — R6889 Other general symptoms and signs: Secondary | ICD-10-CM | POA: Diagnosis not present

## 2019-01-13 DIAGNOSIS — R140 Abdominal distension (gaseous): Secondary | ICD-10-CM | POA: Diagnosis not present

## 2019-01-13 DIAGNOSIS — R159 Full incontinence of feces: Secondary | ICD-10-CM | POA: Diagnosis not present

## 2019-01-13 DIAGNOSIS — R110 Nausea: Secondary | ICD-10-CM | POA: Diagnosis not present

## 2019-01-13 DIAGNOSIS — K30 Functional dyspepsia: Secondary | ICD-10-CM | POA: Diagnosis not present

## 2019-02-23 DIAGNOSIS — R159 Full incontinence of feces: Secondary | ICD-10-CM | POA: Diagnosis not present

## 2019-02-23 DIAGNOSIS — N3941 Urge incontinence: Secondary | ICD-10-CM | POA: Diagnosis not present

## 2019-02-23 DIAGNOSIS — R152 Fecal urgency: Secondary | ICD-10-CM | POA: Diagnosis not present

## 2019-02-23 DIAGNOSIS — R6889 Other general symptoms and signs: Secondary | ICD-10-CM | POA: Diagnosis not present

## 2019-02-23 DIAGNOSIS — R638 Other symptoms and signs concerning food and fluid intake: Secondary | ICD-10-CM | POA: Diagnosis not present

## 2019-02-23 DIAGNOSIS — R351 Nocturia: Secondary | ICD-10-CM | POA: Diagnosis not present

## 2019-02-23 DIAGNOSIS — R82998 Other abnormal findings in urine: Secondary | ICD-10-CM | POA: Diagnosis not present

## 2019-02-23 DIAGNOSIS — N398 Other specified disorders of urinary system: Secondary | ICD-10-CM | POA: Diagnosis not present

## 2019-02-24 DIAGNOSIS — N398 Other specified disorders of urinary system: Secondary | ICD-10-CM | POA: Insufficient documentation

## 2019-02-24 DIAGNOSIS — N3941 Urge incontinence: Secondary | ICD-10-CM

## 2019-02-24 DIAGNOSIS — R159 Full incontinence of feces: Secondary | ICD-10-CM

## 2019-02-24 DIAGNOSIS — R152 Fecal urgency: Secondary | ICD-10-CM

## 2019-02-24 HISTORY — DX: Full incontinence of feces: R15.2

## 2019-02-24 HISTORY — DX: Urge incontinence: N39.41

## 2019-02-24 HISTORY — DX: Other specified disorders of urinary system: N39.8

## 2019-02-24 HISTORY — DX: Full incontinence of feces: R15.9

## 2019-04-21 DIAGNOSIS — E782 Mixed hyperlipidemia: Secondary | ICD-10-CM | POA: Diagnosis not present

## 2019-04-21 DIAGNOSIS — Z79899 Other long term (current) drug therapy: Secondary | ICD-10-CM | POA: Diagnosis not present

## 2019-04-21 DIAGNOSIS — I7300 Raynaud's syndrome without gangrene: Secondary | ICD-10-CM | POA: Diagnosis not present

## 2019-05-20 DIAGNOSIS — Z79899 Other long term (current) drug therapy: Secondary | ICD-10-CM | POA: Diagnosis not present

## 2019-05-20 DIAGNOSIS — E1165 Type 2 diabetes mellitus with hyperglycemia: Secondary | ICD-10-CM | POA: Diagnosis not present

## 2019-05-20 DIAGNOSIS — E785 Hyperlipidemia, unspecified: Secondary | ICD-10-CM | POA: Diagnosis not present

## 2019-05-20 DIAGNOSIS — N3941 Urge incontinence: Secondary | ICD-10-CM | POA: Diagnosis not present

## 2019-05-20 DIAGNOSIS — Z1159 Encounter for screening for other viral diseases: Secondary | ICD-10-CM | POA: Diagnosis not present

## 2019-05-20 DIAGNOSIS — R152 Fecal urgency: Secondary | ICD-10-CM | POA: Diagnosis not present

## 2019-05-20 DIAGNOSIS — Z01812 Encounter for preprocedural laboratory examination: Secondary | ICD-10-CM | POA: Diagnosis not present

## 2019-05-20 DIAGNOSIS — R6889 Other general symptoms and signs: Secondary | ICD-10-CM | POA: Diagnosis not present

## 2019-05-20 DIAGNOSIS — R159 Full incontinence of feces: Secondary | ICD-10-CM | POA: Diagnosis not present

## 2019-05-27 DIAGNOSIS — N3941 Urge incontinence: Secondary | ICD-10-CM | POA: Diagnosis not present

## 2019-05-27 DIAGNOSIS — R6889 Other general symptoms and signs: Secondary | ICD-10-CM | POA: Diagnosis not present

## 2019-05-27 DIAGNOSIS — R159 Full incontinence of feces: Secondary | ICD-10-CM | POA: Diagnosis not present

## 2019-05-27 DIAGNOSIS — R152 Fecal urgency: Secondary | ICD-10-CM | POA: Diagnosis not present

## 2019-06-08 DIAGNOSIS — R159 Full incontinence of feces: Secondary | ICD-10-CM | POA: Diagnosis not present

## 2019-06-08 DIAGNOSIS — N3941 Urge incontinence: Secondary | ICD-10-CM | POA: Diagnosis not present

## 2019-06-08 DIAGNOSIS — Z09 Encounter for follow-up examination after completed treatment for conditions other than malignant neoplasm: Secondary | ICD-10-CM | POA: Diagnosis not present

## 2019-06-08 DIAGNOSIS — R152 Fecal urgency: Secondary | ICD-10-CM | POA: Diagnosis not present

## 2019-08-18 DIAGNOSIS — H43821 Vitreomacular adhesion, right eye: Secondary | ICD-10-CM | POA: Diagnosis not present

## 2019-08-18 DIAGNOSIS — H35073 Retinal telangiectasis, bilateral: Secondary | ICD-10-CM | POA: Diagnosis not present

## 2019-08-18 DIAGNOSIS — E119 Type 2 diabetes mellitus without complications: Secondary | ICD-10-CM | POA: Diagnosis not present

## 2019-08-18 DIAGNOSIS — H43391 Other vitreous opacities, right eye: Secondary | ICD-10-CM | POA: Diagnosis not present

## 2019-08-18 DIAGNOSIS — R6889 Other general symptoms and signs: Secondary | ICD-10-CM | POA: Diagnosis not present

## 2019-08-25 DIAGNOSIS — R6889 Other general symptoms and signs: Secondary | ICD-10-CM | POA: Diagnosis not present

## 2019-08-25 DIAGNOSIS — R1013 Epigastric pain: Secondary | ICD-10-CM | POA: Diagnosis not present

## 2019-09-21 DIAGNOSIS — R1013 Epigastric pain: Secondary | ICD-10-CM | POA: Diagnosis not present

## 2019-09-21 DIAGNOSIS — R1310 Dysphagia, unspecified: Secondary | ICD-10-CM | POA: Diagnosis not present

## 2019-09-21 DIAGNOSIS — Z01818 Encounter for other preprocedural examination: Secondary | ICD-10-CM | POA: Diagnosis not present

## 2019-09-21 DIAGNOSIS — R6889 Other general symptoms and signs: Secondary | ICD-10-CM | POA: Diagnosis not present

## 2019-09-27 DIAGNOSIS — K2970 Gastritis, unspecified, without bleeding: Secondary | ICD-10-CM | POA: Diagnosis not present

## 2019-09-27 DIAGNOSIS — K900 Celiac disease: Secondary | ICD-10-CM | POA: Diagnosis not present

## 2019-09-27 DIAGNOSIS — K5281 Eosinophilic gastritis or gastroenteritis: Secondary | ICD-10-CM | POA: Diagnosis not present

## 2019-09-27 DIAGNOSIS — A048 Other specified bacterial intestinal infections: Secondary | ICD-10-CM | POA: Diagnosis not present

## 2019-09-28 DIAGNOSIS — K2270 Barrett's esophagus without dysplasia: Secondary | ICD-10-CM | POA: Diagnosis not present

## 2019-09-28 DIAGNOSIS — K200 Eosinophilic esophagitis: Secondary | ICD-10-CM | POA: Diagnosis not present

## 2019-09-30 DIAGNOSIS — I10 Essential (primary) hypertension: Secondary | ICD-10-CM | POA: Diagnosis not present

## 2019-09-30 DIAGNOSIS — R0602 Shortness of breath: Secondary | ICD-10-CM | POA: Diagnosis not present

## 2019-09-30 DIAGNOSIS — E782 Mixed hyperlipidemia: Secondary | ICD-10-CM | POA: Diagnosis not present

## 2019-09-30 DIAGNOSIS — Z9109 Other allergy status, other than to drugs and biological substances: Secondary | ICD-10-CM | POA: Diagnosis not present

## 2019-10-27 DIAGNOSIS — R6889 Other general symptoms and signs: Secondary | ICD-10-CM | POA: Diagnosis not present

## 2019-10-27 DIAGNOSIS — R159 Full incontinence of feces: Secondary | ICD-10-CM | POA: Diagnosis not present

## 2019-10-27 DIAGNOSIS — K222 Esophageal obstruction: Secondary | ICD-10-CM | POA: Diagnosis not present

## 2019-10-27 DIAGNOSIS — R1013 Epigastric pain: Secondary | ICD-10-CM | POA: Diagnosis not present

## 2019-11-07 DIAGNOSIS — Z1231 Encounter for screening mammogram for malignant neoplasm of breast: Secondary | ICD-10-CM | POA: Diagnosis not present

## 2019-11-09 DIAGNOSIS — E785 Hyperlipidemia, unspecified: Secondary | ICD-10-CM | POA: Diagnosis not present

## 2019-11-09 DIAGNOSIS — E1169 Type 2 diabetes mellitus with other specified complication: Secondary | ICD-10-CM | POA: Diagnosis not present

## 2019-11-09 DIAGNOSIS — R8290 Unspecified abnormal findings in urine: Secondary | ICD-10-CM | POA: Diagnosis not present

## 2019-11-09 DIAGNOSIS — J309 Allergic rhinitis, unspecified: Secondary | ICD-10-CM | POA: Diagnosis not present

## 2019-11-09 DIAGNOSIS — K760 Fatty (change of) liver, not elsewhere classified: Secondary | ICD-10-CM | POA: Diagnosis not present

## 2019-11-09 DIAGNOSIS — R0600 Dyspnea, unspecified: Secondary | ICD-10-CM | POA: Diagnosis not present

## 2019-11-09 DIAGNOSIS — Z0000 Encounter for general adult medical examination without abnormal findings: Secondary | ICD-10-CM | POA: Diagnosis not present

## 2019-11-09 DIAGNOSIS — Z23 Encounter for immunization: Secondary | ICD-10-CM | POA: Diagnosis not present

## 2019-11-09 DIAGNOSIS — Z86010 Personal history of colonic polyps: Secondary | ICD-10-CM | POA: Diagnosis not present

## 2019-11-09 DIAGNOSIS — I10 Essential (primary) hypertension: Secondary | ICD-10-CM | POA: Diagnosis not present

## 2019-11-09 DIAGNOSIS — H6123 Impacted cerumen, bilateral: Secondary | ICD-10-CM | POA: Diagnosis not present

## 2019-11-09 DIAGNOSIS — E039 Hypothyroidism, unspecified: Secondary | ICD-10-CM | POA: Diagnosis not present

## 2019-12-12 ENCOUNTER — Other Ambulatory Visit: Payer: Self-pay

## 2019-12-12 ENCOUNTER — Encounter: Payer: Self-pay | Admitting: Pulmonary Disease

## 2019-12-12 ENCOUNTER — Ambulatory Visit (INDEPENDENT_AMBULATORY_CARE_PROVIDER_SITE_OTHER): Payer: Medicare HMO | Admitting: Pulmonary Disease

## 2019-12-12 VITALS — BP 114/62 | HR 68 | Temp 97.1°F | Ht 62.0 in | Wt 150.6 lb

## 2019-12-12 DIAGNOSIS — R6889 Other general symptoms and signs: Secondary | ICD-10-CM | POA: Diagnosis not present

## 2019-12-12 DIAGNOSIS — G4734 Idiopathic sleep related nonobstructive alveolar hypoventilation: Secondary | ICD-10-CM | POA: Diagnosis not present

## 2019-12-12 DIAGNOSIS — G4719 Other hypersomnia: Secondary | ICD-10-CM | POA: Diagnosis not present

## 2019-12-12 DIAGNOSIS — R0602 Shortness of breath: Secondary | ICD-10-CM

## 2019-12-12 DIAGNOSIS — E669 Obesity, unspecified: Secondary | ICD-10-CM | POA: Diagnosis not present

## 2019-12-12 HISTORY — DX: Shortness of breath: R06.02

## 2019-12-12 NOTE — Patient Instructions (Signed)
Shortness of breath Will obtain pulmonary function test for evaluation Home sleep study  Excessive daytime sleepiness, reported nocturnal hypoxemia, witnessed apnea Order home sleep study

## 2019-12-12 NOTE — Progress Notes (Signed)
Subjective:   PATIENT ID: Alexis Molina GENDER: female DOB: 1951-06-03, MRN: [id number]   HPI  Chief Complaint  Patient presents with  . Consult    referred for chronic dyspnea.      Reason for Visit: New consult for chronic dyspnea; referral made by Dr. Rex Kras at [place] [facility]  Alexis Molina is a 69 year old female never smoker with obesity, allergic rhinitis, hypertension, type 2 diabetes, hypothyroidism, depression and fatty liver who presents as a new consultation for chronic dyspnea.  On review of 12-20 PCP note, she reported intermittent shortness of breath over the last several years worsened with exertion, weather changes and talking.  She has been prescribed albuterol inhaler which helps.  Notes state that cardiac work-up has been completed and has been negative.  She has not been tested for asthma.  Her dyspnea on exertion has gradually worsened in the last year requiring increased frequency of her albuterol inhaler up to 1-2 times a day. Associated with wheezing. Triggered with exertion, bending over, heat and talking continuously. When walking up a flight a stairs, she has to stop to catch her breath and is associated with chest pain. Her husband tells her that she has wheezing and snoring at night. She has previously been told she has to remember to breath at night when she was hospitalized over the summer and required oxygen.  She reports excess daytime fatigue. She does not take naps during the day. She is able to fall asleep but is unable to stay asleep. Denies weight gain. Her weight has been stable in the last year 145-150.  She is followed by Cardiology for a "leaky valve" in the 2010s but no echocardiogram seen in EMR. She is scheduled to see Cardiology on 12/20/18.   Epworth Sleepiness Scale  Situation Chance of Dozing (0-3) Sitting and reading _____0___________________________________ Watching TV  __________1______________________________ Sitting, inactive in a public place (e.g. a theatre or a meeting) ____0_____ As a passenger in a car for an hour without a break __0_______________ Lying down to rest in the afternoon when circumstances permit [id number] Sitting and talking to someone ____________________0______________ Sitting quietly after a lunch without alcohol ___________0_____________ In a car, while stopped for a few minutes in the traffic ___0_____________   Epworth Score: 1  Social History: Social smoker for 3-4 years during break time. Quit 30 years ago  Environmental exposures:  Previously worked in Environmental education officer jobs.   I have personally reviewed patient's past medical/family/social history, allergies, current medications.  Past Medical History:  Diagnosis Date  . Anxiety   . Cataract   . Chest pain   . Depression   . Diabetes mellitus without complication ([place])   . Fatty liver   . Hiatal hernia   . Hyperlipidemia   . Hypertension   . IBS (irritable bowel syndrome)   . Peripheral edema   . Snake bite   . Thyroid disease      Family History  Problem Relation Age of Onset  . Alzheimer's disease Mother   . Diabetes Mother   . Hypertension Mother   . Ulcers Father   . Hypertension Father   . Arthritis Father   . Heart failure Father   . Lung cancer Brother      Social History   Occupational History  . Not on file  Tobacco Use  . Smoking status: Never Smoker  . Smokeless tobacco: Never Used  Substance and Sexual Activity  . Alcohol use: No  .  Drug use: No  . Sexual activity: Not on file    Allergies  Allergen Reactions  . Darvocet [Propoxyphene N-Acetaminophen] Other (See Comments)    Asthma attack  . Orange Fruit [Citrus] Itching and Swelling  . Vicodin [Hydrocodone-Acetaminophen] Hives and Itching  . Dust Mite Extract Itching  . Glimepiride Itching  . Penicillins Hives  . Sulfa Antibiotics Hives     Outpatient  Medications Prior to Visit  Medication Sig Dispense Refill  . albuterol (VENTOLIN HFA) 108 (90 Base) MCG/ACT inhaler Inhale 2 puffs into the lungs every 4 (four) hours as needed for wheezing or shortness of breath.    . fexofenadine (ALLEGRA) 180 MG tablet Take 180 mg by mouth at bedtime.    Marland Kitchen levothyroxine (SYNTHROID, LEVOTHROID) 75 MCG tablet Take 75 mcg by mouth daily before breakfast.    . lisinopril (PRINIVIL,ZESTRIL) 20 MG tablet Take 20 mg by mouth daily.    . Melatonin 5 MG TABS Take 1 tablet by mouth at bedtime.    . montelukast (SINGULAIR) 10 MG tablet Take 10 mg by mouth daily.    . Multiple Vitamin (MULTIVITAMIN WITH MINERALS) TABS tablet Take 1 tablet by mouth daily.    . Omega-3 Fatty Acids (FISH OIL) 1000 MG CAPS Take 1 capsule by mouth daily.    . ondansetron (ZOFRAN) 4 MG tablet Take 1 tablet (4 mg total) by mouth every 8 (eight) hours as needed for nausea. 10 tablet 0  . rosuvastatin (CRESTOR) 20 MG tablet Take 20 mg by mouth daily.    Marland Kitchen triamterene-hydrochlorothiazide (MAXZIDE) 75-50 MG per tablet Take 1 tablet by mouth daily.    Marland Kitchen atorvastatin (LIPITOR) 80 MG tablet Take 80 mg by mouth daily at 6 PM.     . azelastine (ASTELIN) 0.1 % nasal spray Place 1 spray into both nostrils 2 (two) times daily as needed for rhinitis. Use in each nostril as directed    . fexofenadine (ALLEGRA ALLERGY) 180 MG tablet Take 180 mg by mouth daily.    . Lactobacillus (DIGESTIVE HEALTH PROBIOTIC) CAPS Take 1 capsule by mouth daily.    . potassium chloride SA (K-DUR,KLOR-CON) 20 MEQ tablet Take 2 tablets (40 mEq total) by mouth daily. (Patient not taking: Reported on 12/12/2019) 60 tablet 2  . sertraline (ZOLOFT) 50 MG tablet Take 50 mg by mouth daily.     No facility-administered medications prior to visit.    Review of Systems  Constitutional: Negative for chills, diaphoresis, fever, malaise/fatigue and weight loss.  HENT: Negative for congestion, ear pain and sore throat.   Respiratory:  Positive for shortness of breath and wheezing. Negative for cough, hemoptysis and sputum production.   Cardiovascular: Positive for chest pain. Negative for palpitations and leg swelling.  Gastrointestinal: Negative for abdominal pain, heartburn and nausea.  Genitourinary: Negative for frequency.  Musculoskeletal: Negative for joint pain and myalgias.  Skin: Negative for itching and rash.  Neurological: Negative for dizziness, weakness and headaches.  Endo/Heme/Allergies: Does not bruise/bleed easily.  Psychiatric/Behavioral: Negative for depression. The patient is not nervous/anxious.      Objective:   Vitals:   12/12/19 1511  BP: 114/62  Pulse: 68  Temp: (!) 97.1 F (36.2 C)  TempSrc: Temporal  SpO2: 100%  Weight: 150 lb 9.6 oz (68.3 kg)  Height: 5\' 2"  (1.575 m)   SpO2: 100 % O2 Device: None (Room air)  Physical Exam: General: Well-appearing, no acute distress HENT: [place], AT Eyes: EOMI, no scleral icterus Respiratory: Clear to auscultation bilaterally.  No  crackles, wheezing or rales Cardiovascular: RRR, -M/R/G, no JVD GI: BS+, soft, nontender Extremities:-Edema,-tenderness Neuro: AAO x4, CNII-XII grossly intact Skin: Intact, no rashes or bruising Psych: Normal mood, normal affect  Data Reviewed:  Imaging: CXR 06/23/13 - No infiltrate, edema or effusion  PFT: None on file  Imaging, labs and tests noted above have been reviewed independently by me.    Assessment & Plan:   Discussion: 69 year old female never smoker with obesity, allergic rhinitis, hypertension, type 2 diabetes, hypothyroidism, depression and fatty liver who presents as a new consultation for chronic dyspnea. Unclear etiology though with reported nocturnal hypoxemia and excessive daytime sleepiness, would be reasonable to rule out for obstructive sleep apnea. Will also obtain pulmonary function tests. She will also be evaluated by Cardiology this month for her leaky valve. No echocardiogram on  record. Will defer Cardiology work-up to specialty team.  Shortness of breath Will obtain pulmonary function test for evaluation Home sleep study  Excessive daytime sleepiness, reported nocturnal hypoxemia, witnessed apnea Order home sleep study  Health Maintenance Immunization History  Administered Date(s) Administered  . Fluad Quad(high Dose 65+) 10/07/2019  . Pneumococcal Conjugate-13 10/15/2017  . Pneumococcal Polysaccharide-23 10/21/2018   CT Lung Screen - not indicated  Orders Placed This Encounter  Procedures  . Pulmonary function test    Standing Status:   Future    Standing Expiration Date:   12/11/2020    Order Specific Question:   Where should this test be performed?    Answer:   [facility]    Order Specific Question:   Full PFT: includes the following: basic spirometry, spirometry pre & post bronchodilator, diffusion capacity (DLCO), lung volumes    Answer:   Full PFT    Order Specific Question:   MIP/MEP    Answer:   No    Order Specific Question:   6 minute walk    Answer:   No    Order Specific Question:   ABG    Answer:   No    Order Specific Question:   Diffusion capacity (DLCO)    Answer:   Yes    Order Specific Question:   Lung volumes    Answer:   Yes    Order Specific Question:   Methacholine challenge    Answer:   No  . Home sleep test    Standing Status:   Future    Standing Expiration Date:   12/11/2020    Order Specific Question:   Where should this test be performed:    Answer:   [facility]  No orders of the defined types were placed in this encounter.   Return in about 3 months (around 03/11/2020).   I have spent 45-minutes charting, coordinating care and discussing medical diagnosis, plan and counseling with the patient/family as noted above.  Haines, MD [facility] 12/12/2019 4:24 PM  Office Number [phone]

## 2019-12-14 DIAGNOSIS — Z09 Encounter for follow-up examination after completed treatment for conditions other than malignant neoplasm: Secondary | ICD-10-CM | POA: Diagnosis not present

## 2019-12-14 DIAGNOSIS — R6889 Other general symptoms and signs: Secondary | ICD-10-CM | POA: Diagnosis not present

## 2019-12-14 DIAGNOSIS — N3941 Urge incontinence: Secondary | ICD-10-CM | POA: Diagnosis not present

## 2019-12-14 DIAGNOSIS — R152 Fecal urgency: Secondary | ICD-10-CM | POA: Diagnosis not present

## 2019-12-14 DIAGNOSIS — R159 Full incontinence of feces: Secondary | ICD-10-CM | POA: Diagnosis not present

## 2019-12-21 ENCOUNTER — Ambulatory Visit (INDEPENDENT_AMBULATORY_CARE_PROVIDER_SITE_OTHER): Payer: Medicare HMO | Admitting: Interventional Cardiology

## 2019-12-21 ENCOUNTER — Encounter: Payer: Self-pay | Admitting: Interventional Cardiology

## 2019-12-21 ENCOUNTER — Other Ambulatory Visit (INDEPENDENT_AMBULATORY_CARE_PROVIDER_SITE_OTHER): Payer: Medicare HMO

## 2019-12-21 ENCOUNTER — Other Ambulatory Visit: Payer: Self-pay

## 2019-12-21 VITALS — BP 108/62 | HR 62 | Ht 62.0 in | Wt 151.1 lb

## 2019-12-21 DIAGNOSIS — R002 Palpitations: Secondary | ICD-10-CM

## 2019-12-21 DIAGNOSIS — R0789 Other chest pain: Secondary | ICD-10-CM

## 2019-12-21 DIAGNOSIS — R072 Precordial pain: Secondary | ICD-10-CM

## 2019-12-21 DIAGNOSIS — Z01812 Encounter for preprocedural laboratory examination: Secondary | ICD-10-CM | POA: Diagnosis not present

## 2019-12-21 DIAGNOSIS — R6889 Other general symptoms and signs: Secondary | ICD-10-CM | POA: Diagnosis not present

## 2019-12-21 MED ORDER — METOPROLOL TARTRATE 50 MG PO TABS: ORAL_TABLET | ORAL | 0 refills | Status: DC

## 2019-12-21 NOTE — Patient Instructions (Addendum)
Medication Instructions:  Your physician recommends that you continue on your current medications as directed. Please refer to the Current Medication list given to you today.  If you need a refill on your cardiac medications before your next appointment, please call your pharmacy.   Lab work: Your physician recommends that you return for lab work Artist) prior to Cardiac CT   If you have labs (blood work) drawn today and your tests are completely normal, you will receive your results only by: Marland Kitchen MyChart Message (if you have MyChart) OR . A paper copy in the mail If you have any lab test that is abnormal or we need to change your treatment, we will call you to review the results.  Testing/Procedures: Your physician has recommended that you wear a 14 day monitor. These monitors are medical devices that record the heart's electrical activity. Doctors most often use these monitors to diagnose arrhythmias. Arrhythmias are problems with the speed or rhythm of the heartbeat. The monitor is a small, portable device. You can wear one while you do your normal daily activities. This is usually used to diagnose what is causing palpitations/syncope (passing out).  Your physician has requested that you have cardiac CT. Cardiac computed tomography (CT) is a painless test that uses an x-ray machine to take clear, detailed pictures of your heart. For further information please visit [email or web address][facility]tn. Please follow instruction sheet as given.   Follow-Up: . Based on test results  Any Other Special Instructions Will Be Listed Below (If Applicable).  CARDIAC CT INSTRUCTIONS Your cardiac CT will be scheduled at one of the below locations:   [facility] [address] [place], [place] [address] (336) [place] [address] [place], [place] [address] [phone]  If scheduled at [facility], please arrive at the  [facility] main entrance of [facility] 30-45 minutes prior to test start time. Proceed to the [facility] Radiology Department (first floor) to check-in and test prep.  [facility], please arrive 15 mins early for check-in and test prep.  Please follow these instructions carefully (unless otherwise directed):    On the Night Before the Test: . Be sure to Drink plenty of water. . Do not consume any caffeinated/decaffeinated beverages or chocolate 12 hours prior to your test. . Do not take any antihistamines 12 hours prior to your test.   On the Day of the Test: . Drink plenty of water. Do not drink any water within one hour of the test. . Do not eat any food 4 hours prior to the test. . You may take your regular medications prior to the test.  . Take metoprolol (Lopressor) 50 MG two hours prior to test. . HOLD Triamterene-Hydrochlorothiazide morning of the test. . FEMALES- please wear underwire-free bra if available       After the Test: . Drink plenty of water. . After receiving IV contrast, you may experience a mild flushed feeling. This is normal. . On occasion, you may experience a mild rash up to 24 hours after the test. This is not dangerous. If this occurs, you can take Benadryl 25 mg and increase your fluid intake. . If you experience trouble breathing, this can be serious. If it is severe call 911 IMMEDIATELY. If it is mild, please call our office.    Once we have confirmed authorization from your insurance company, we will call you to set  up a date and time for your test.   For non-scheduling related questions, please contact the cardiac imaging nurse navigator should you have any questions/concerns: Marchia Bond, RN Navigator Cardiac Imaging [facility] [phone] Office

## 2019-12-21 NOTE — Progress Notes (Signed)
Cardiology Office Note   Date:  12/21/2019   ID:  Alexis Molina, Alexis Molina 16-Aug-1951, MRN [id number]  PCP:  Hulan Fess, MD    No chief complaint on file.  Family h/o CAD  Wt Readings from Last 3 Encounters:  12/21/19 151 lb 1.9 oz (68.5 kg)  12/12/19 150 lb 9.6 oz (68.3 kg)  07/30/15 170 lb (77.1 kg)       History of Present Illness: Alexis Molina is a 69 y.o. female  With family h/o CAD.  Several family members have had stents placed, mostly cousins.  She had a cath in 2016 for chest pain showing:  "No significant coronary artery disease.  LVEDP 15 mmHg. Due to tortuosity, unable to get the pigtail catheter into the left ventricle for ventriculogram.  Given the severe tortuosity in the right subclavian, would not attempt right radial cath in the future. If cardiac cath was needed, would use right groin or left radial.   Will obtain echocardiogram. Her chest discomfort does not appear to be ischemia related. Alternative etiologies should be investigated."  She returns for f/u.  She has some chest pain when she feels her heart beating faster.  The inhaler helps, but she sometimes needs to stop her activity.  She has some occasions when her HR speeds up while she is standing still.    Denies : Dizziness. Leg edema. Nitroglycerin use. Orthopnea. Paroxysmal nocturnal dyspnea. Shortness of breath. Syncope.   She feels her palpitations daily.  She does crafts and after 10-15 minutes, she feels some fatigue and palpitations with that activity.        Past Medical History:  Diagnosis Date  . Allergic rhinitis   . Anxiety   . Body mass index 30.0-30.9, adult   . Cataract   . Cataracts, bilateral   . Chest pain   . Depression   . Diabetes mellitus without complication ([place])   . DM (diabetes mellitus) ([place])   . Fatigue   . Fatty liver   . FH: colonic polyps   . Hemangioma of intra-abdominal structures   . Hiatal hernia   . History of colonic polyps   .  Hyperlipidemia   . Hypertension   . Hypothyroidism   . IBS (irritable bowel syndrome)   . Obesity, unspecified   . Osteopenia   . 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[place])   . Peripheral edema   . Peripheral edema    after knee surgery  . Raynaud's syndrome without gangrene   . Snake bite   . SOB (shortness of breath)   . Thyroid disease   . Tubular adenoma of colon   . Vitamin D deficiency   . Vitreous floaters of right eye     Past Surgical History:  Procedure Laterality Date  . ABDOMINAL HYSTERECTOMY    . CARDIAC CATHETERIZATION N/A 07/30/2015   Procedure: Left Heart Cath and Coronary Angiography;  Surgeon: Jettie Booze, MD;  Location: [facility];  Service: Cardiovascular;  Laterality: N/A;  . CARPAL TUNNEL RELEASE    . CHOLECYSTECTOMY    . KNEE SURGERY       Current Outpatient Medications  Medication Sig Dispense Refill  . albuterol (VENTOLIN HFA) 108 (90 Base) MCG/ACT inhaler Inhale 2 puffs into the lungs every 4 (four) hours as needed for wheezing or shortness of breath.    . Cholecalciferol (VITAMIN D) 125 MCG (5000 UT) CAPS Take by mouth.    . diphenhydrAMINE (BENADRYL) 25 MG tablet Take  25 mg by mouth every 6 (six) hours as needed.    . fexofenadine (ALLEGRA) 180 MG tablet Take 180 mg by mouth at bedtime.    Marland Kitchen levothyroxine (SYNTHROID, LEVOTHROID) 75 MCG tablet Take 75 mcg by mouth daily before breakfast.    . lisinopril (PRINIVIL,ZESTRIL) 20 MG tablet Take 20 mg by mouth daily.    . Melatonin 5 MG TABS Take 1 tablet by mouth at bedtime.    . montelukast (SINGULAIR) 10 MG tablet Take 10 mg by mouth daily.    . Multiple Vitamin (MULTIVITAMIN WITH MINERALS) TABS tablet Take 1 tablet by mouth daily.    . Omega-3 Fatty Acids (FISH OIL) 1000 MG CAPS Take 1 capsule by mouth daily.    Marland Kitchen omeprazole (PRILOSEC) 40 MG capsule Take 40 mg by mouth daily.    . polycarbophil (FIBERCON) 625 MG tablet Take 625 mg by mouth daily.    . rosuvastatin (CRESTOR) 20 MG  tablet Take 20 mg by mouth daily.    Marland Kitchen triamterene-hydrochlorothiazide (MAXZIDE) 75-50 MG per tablet Take 1 tablet by mouth daily.     No current facility-administered medications for this visit.    Allergies:   Darvocet [propoxyphene n-acetaminophen], Orange fruit [citrus], Vicodin [hydrocodone-acetaminophen], Dust mite extract, Morphine, Glimepiride, Penicillins, and Sulfa antibiotics    Social History:  The patient  reports that she has quit smoking. She has never used smokeless tobacco. She reports that she does not drink alcohol or use drugs.   Family History:  The patient's family history includes Alzheimer's disease in her mother; Arthritis in her father; Diabetes in her mother; Heart failure in her father; Hypertension in her father and mother; Lung cancer in her brother; Ulcers in her father.    ROS:  Please see the history of present illness.   Otherwise, review of systems are positive for palpitations, chest pain.   All other systems are reviewed and negative.    PHYSICAL EXAM: VS:  BP 108/62   Pulse 62   Ht 5\' 2"  (1.575 m)   Wt 151 lb 1.9 oz (68.5 kg)   SpO2 96%   BMI 27.64 kg/m  , BMI Body mass index is 27.64 kg/m. GEN: Well nourished, well developed, in no acute distress  HEENT: normal  Neck: no JVD, carotid bruits, or masses Cardiac: RRR; no murmurs, rubs, or gallops,no edema  Respiratory:  clear to auscultation bilaterally, normal work of breathing GI: soft, nontender, nondistended, + BS MS: no deformity or atrophy  Skin: warm and dry, no rash Neuro:  Strength and sensation are intact Psych: euthymic mood, full affect   EKG:   The ekg ordered today demonstrates NSR, no ST changes   Recent Labs: No results found for requested labs within last 8760 hours.   Lipid Panel No results found for: CHOL, TRIG, HDL, CHOLHDL, VLDL, LDLCALC, LDLDIRECT   Other studies Reviewed: Additional studies/ records that were reviewed today with results demonstrating: .    ASSESSMENT AND PLAN:  1. Palpitations: Plan for monitor to look for SVT that may be causing palpitations. 2. Chest discomfort:  Worse with walking and when her HR increases.  Plan for coronary CTA given family h/o CAD. 3. May need to consider echo for DOE if other w/u is unremarkable.     Current medicines are reviewed at length with the patient today.  The patient concerns regarding her medicines were addressed.  The following changes have been made:  No change  Labs/ tests ordered today include: CTA, monitor No  orders of the defined types were placed in this encounter.   Recommend 150 minutes/week of aerobic exercise Low fat, low carb, high fiber diet recommended  Disposition:   FU based on results of tests   Signed, Larae Grooms, MD  12/21/2019 11:18 AM    [address], [place], [place]  [address] Phone: [phone]; Fax: [phone]

## 2019-12-27 ENCOUNTER — Telehealth: Payer: Self-pay | Admitting: *Deleted

## 2019-12-27 NOTE — Telephone Encounter (Signed)
   [facility] Pre-operative Risk Assessment    Request for surgical clearance:  1. What type of surgery is being performed? DENTAL FILLINGS, CLEANING   2. When is this surgery scheduled? TBD   3. What type of clearance is required (medical clearance vs. Pharmacy clearance to hold med vs. Both)? MEDICAL  4. Are there any medications that need to be held prior to surgery and how long? NONE LISTED   5. Practice name and name of physician performing surgery?  [facility];    6. What is your office phone number [phone]    7.   What is your office fax number I WILL CALL THE OFFICE TOMORROW FOR FAX NUMBER  8.   Anesthesia type (None, local, MAC, general) ? NOT LISTED    Alexis Molina 12/27/2019, 5:30 PM  _________________________________________________________________   (provider comments below)

## 2019-12-28 NOTE — Telephone Encounter (Signed)
   Primary Cardiologist: Larae Grooms, MD  Chart reviewed as part of pre-operative protocol coverage. Simple dental extractions are considered low risk procedures per guidelines and generally do not require any specific cardiac clearance. It is also generally accepted that for simple extractions and dental cleanings, there is no need to interrupt blood thinner therapy.   SBE prophylaxis is not required for the patient from a cardiac standpoint.   I will route this recommendation to the requesting party via Epic fax function and remove from pre-op pool.  Please call with questions.  Daune Perch, NP 12/28/2019, 2:43 PM

## 2019-12-28 NOTE — Telephone Encounter (Signed)
FAX NUMBER IS [phone] MD IS DR. Harbine

## 2019-12-30 ENCOUNTER — Other Ambulatory Visit: Payer: Self-pay

## 2019-12-30 ENCOUNTER — Emergency Department (HOSPITAL_COMMUNITY)
Admission: EM | Admit: 2019-12-30 | Discharge: 2019-12-30 | Disposition: A | Discharge status: Home / Self Care | Payer: Medicare HMO | Attending: Emergency Medicine | Admitting: Emergency Medicine

## 2019-12-30 ENCOUNTER — Encounter (HOSPITAL_COMMUNITY): Payer: Self-pay

## 2019-12-30 DIAGNOSIS — R0602 Shortness of breath: Secondary | ICD-10-CM | POA: Diagnosis present

## 2019-12-30 DIAGNOSIS — F419 Anxiety disorder, unspecified: Secondary | ICD-10-CM

## 2019-12-30 DIAGNOSIS — E039 Hypothyroidism, unspecified: Secondary | ICD-10-CM | POA: Insufficient documentation

## 2019-12-30 DIAGNOSIS — R0789 Other chest pain: Secondary | ICD-10-CM | POA: Diagnosis not present

## 2019-12-30 DIAGNOSIS — Z79899 Other long term (current) drug therapy: Secondary | ICD-10-CM | POA: Diagnosis not present

## 2019-12-30 DIAGNOSIS — E119 Type 2 diabetes mellitus without complications: Secondary | ICD-10-CM | POA: Insufficient documentation

## 2019-12-30 DIAGNOSIS — I10 Essential (primary) hypertension: Secondary | ICD-10-CM | POA: Diagnosis not present

## 2019-12-30 NOTE — ED Triage Notes (Signed)
Pt arrived POV assisted into ED in wheelchair CC chest pain and possible anxiety attack. Pt reports " I just cant get it together I will be fine I will be fine". Pt reports not taking her anxiety medication anymore and " she is worried about her husband which is being seen in [place] today".  VSS Afebrile   Hx anxiety attacks

## 2019-12-30 NOTE — ED Provider Notes (Signed)
[facility] DEPT Provider Note   CSN: [id number] Arrival date & time: 12/30/19  1545     History Chief Complaint  Patient presents with  . Chest Pain  . Shortness of Breath    Possible Anxiety     Alexis Molina is a 69 y.o. female.  HPI   69 year old female with anxiety.  Feels short of breath and having some chest discomfort.  Of note, patient's husband checked in the emergency room shortly before she did.  I actually spoke with her via phone when she was in her car about her husband's care.  Try to reassure her that he is going to be fine but she became increasingly anxious and began having some shortness of breath so checked in as a patient herself.  Past Medical History:  Diagnosis Date  . Allergic rhinitis   . Anxiety   . Body mass index 30.0-30.9, adult   . Cataract   . Cataracts, bilateral   . Chest pain   . Depression   . Diabetes mellitus without complication ([place])   . DM (diabetes mellitus) ([place])   . Fatigue   . Fatty liver   . FH: colonic polyps   . Hemangioma of intra-abdominal structures   . Hiatal hernia   . History of colonic polyps   . Hyperlipidemia   . Hypertension   . Hypothyroidism   . IBS (irritable bowel syndrome)   . Obesity, unspecified   . Osteopenia   . 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 Peripheral edema   . Peripheral edema    after knee surgery  . Raynaud's syndrome without gangrene   . Snake bite   . SOB (shortness of breath)   . Thyroid disease   . Tubular adenoma of colon   . Vitamin D deficiency   . Vitreous floaters of right eye    Patient Active Problem List   Diagnosis Date Noted  . Shortness of breath 12/12/2019  . Excessive daytime sleepiness 12/12/2019  . Angina pectoris ([place])   . Diabetes mellitus, type 2 ([place]) 05/11/2015  . Family history of premature CAD 05/11/2015   Past Surgical History:  Procedure Laterality Date  . ABDOMINAL HYSTERECTOMY    . CARDIAC  CATHETERIZATION N/A 07/30/2015   Procedure: Left Heart Cath and Coronary Angiography;  Surgeon: Jettie Booze, MD;  Location: [facility];  Service: Cardiovascular;  Laterality: N/A;  . CARPAL TUNNEL RELEASE    . CHOLECYSTECTOMY    . KNEE SURGERY      OB History   No obstetric history on file.    Family History  Problem Relation Age of Onset  . Alzheimer's disease Mother   . Diabetes Mother   . Hypertension Mother   . Ulcers Father   . Hypertension Father   . Arthritis Father   . Heart failure Father   . Lung cancer Brother    Social History   Tobacco Use  . Smoking status: Former Research scientist (life sciences)  . Smokeless tobacco: Never Used  Substance Use Topics  . Alcohol use: No  . Drug use: No   Home Medications Prior to Admission medications   Medication Sig Start Date End Date Taking? Authorizing Provider  albuterol (VENTOLIN HFA) 108 (90 Base) MCG/ACT inhaler Inhale 2 puffs into the lungs every 4 (four) hours as needed for wheezing or shortness of breath.    [provider]  Cholecalciferol (VITAMIN D) 125 MCG (5000 UT) CAPS Take by mouth.  [provider]  diphenhydrAMINE (BENADRYL) 25 MG tablet Take 25 mg by mouth every 6 (six) hours as needed.    [provider]  fexofenadine (ALLEGRA) 180 MG tablet Take 180 mg by mouth at bedtime.    [provider]  levothyroxine (SYNTHROID, LEVOTHROID) 75 MCG tablet Take 75 mcg by mouth daily before breakfast.    [provider]  lisinopril (PRINIVIL,ZESTRIL) 20 MG tablet Take 20 mg by mouth daily.    [provider]  Melatonin 5 MG TABS Take 1 tablet by mouth at bedtime.    [provider]  metoprolol tartrate (LOPRESSOR) 50 MG tablet Take 1 tablet by mouth 2 hours prior to Cardiac CT 12/21/19   Jettie Booze, MD  montelukast (SINGULAIR) 10 MG tablet Take 10 mg by mouth daily.    [provider]  Multiple Vitamin (MULTIVITAMIN WITH MINERALS) TABS tablet Take  1 tablet by mouth daily.    [provider]  Omega-3 Fatty Acids (FISH OIL) 1000 MG CAPS Take 1 capsule by mouth daily.    [provider]  omeprazole (PRILOSEC) 40 MG capsule Take 40 mg by mouth daily.    [provider]  polycarbophil (FIBERCON) 625 MG tablet Take 625 mg by mouth daily.    [provider]  rosuvastatin (CRESTOR) 20 MG tablet Take 20 mg by mouth daily.    [provider]  triamterene-hydrochlorothiazide (MAXZIDE) 75-50 MG per tablet Take 1 tablet by mouth daily.    [provider]    Allergies    Darvocet [propoxyphene n-acetaminophen], Orange fruit [citrus], Vicodin [hydrocodone-acetaminophen], Dust mite extract, Morphine, Glimepiride, Penicillins, and Sulfa antibiotics  Review of Systems   Review of Systems All systems reviewed and negative, other than as noted in HPI.  Physical Exam Updated Vital Signs BP 138/79   Pulse 71   Temp 98.3 F (36.8 C) (Oral)   Resp (!) 25   Ht 5\' 2"  (1.575 m)   Wt 68 kg   SpO2 100%   BMI 27.44 kg/m   Physical Exam Vitals and nursing note reviewed.  Constitutional:      General: She is not in acute distress.    Appearance: She is well-developed.  HENT:     Head: Normocephalic and atraumatic.  Eyes:     General:        Right eye: No discharge.        Left eye: No discharge.     Conjunctiva/sclera: Conjunctivae normal.  Cardiovascular:     Rate and Rhythm: Normal rate and regular rhythm.     Heart sounds: Normal heart sounds. No murmur. No friction rub. No gallop.   Pulmonary:     Effort: Pulmonary effort is normal. No respiratory distress.     Breath sounds: Normal breath sounds.  Abdominal:     General: There is no distension.     Palpations: Abdomen is soft.     Tenderness: There is no abdominal tenderness.  Musculoskeletal:        General: No tenderness.     Cervical back: Neck supple.  Skin:    General: Skin is warm and dry.  Neurological:     Mental  Status: She is alert.  Psychiatric:     Comments: anxious     ED Results / Procedures / Treatments   Labs (all labs ordered are listed, but only abnormal results are displayed) Labs Reviewed - No data to display  EKG None   EKG:  Rhythm: sinus  rhythm PVC Rate: 60 PR: 143 ms QRS: 85 ms QTc: 400 ms ST segments: NS ST changes   Radiology No results found.  Procedures Procedures (including critical care time)  Medications Ordered in ED Medications - No data to display  ED Course  I have reviewed the triage vital signs and the nursing notes.  Pertinent labs & imaging results that were available during my care of the patient were reviewed by me and considered in my medical decision making (see chart for details).    MDM Rules/Calculators/A&P  68yF with what seems like anxiety. I am actually seeing her husband as a patient with hyperglycemia. She checked in after him because she is very upset about him being here.  We placed her in his room.  She is now feeling much, better.  Further work-up deferred.  Low suspicion for emergent process. Final Clinical Impression(s) / ED Diagnoses Final diagnoses:  Anxiety    Rx / DC Orders ED Discharge Orders    None       Virgel Manifold, MD 01/04/20 [phone]

## 2019-12-30 NOTE — ED Triage Notes (Signed)
Patient c/o increased SOB and chest pain x months. Patient states she is very anxious today and states she has not taken her anxiety medication x 1 year. Patient's husband is a patient in the ED today. Patient crying and states she is very upset about his cancer diagnosis and states, "I do not want to lose him."

## 2019-12-30 NOTE — ED Notes (Signed)
Patient sitting with her husband in room #17 as per permission from Dr. Wilson Singer.

## 2020-01-16 DIAGNOSIS — I10 Essential (primary) hypertension: Secondary | ICD-10-CM | POA: Diagnosis not present

## 2020-01-16 DIAGNOSIS — E039 Hypothyroidism, unspecified: Secondary | ICD-10-CM | POA: Diagnosis not present

## 2020-01-16 DIAGNOSIS — E785 Hyperlipidemia, unspecified: Secondary | ICD-10-CM | POA: Diagnosis not present

## 2020-01-16 DIAGNOSIS — E1165 Type 2 diabetes mellitus with hyperglycemia: Secondary | ICD-10-CM | POA: Diagnosis not present

## 2020-01-16 DIAGNOSIS — E782 Mixed hyperlipidemia: Secondary | ICD-10-CM | POA: Diagnosis not present

## 2020-01-16 DIAGNOSIS — E1169 Type 2 diabetes mellitus with other specified complication: Secondary | ICD-10-CM | POA: Diagnosis not present

## 2020-01-16 DIAGNOSIS — R002 Palpitations: Secondary | ICD-10-CM | POA: Diagnosis not present

## 2020-01-16 DIAGNOSIS — E119 Type 2 diabetes mellitus without complications: Secondary | ICD-10-CM | POA: Diagnosis not present

## 2020-01-22 ENCOUNTER — Encounter (HOSPITAL_COMMUNITY): Payer: Self-pay

## 2020-01-22 ENCOUNTER — Emergency Department (HOSPITAL_COMMUNITY)
Admission: EM | Admit: 2020-01-22 | Discharge: 2020-01-22 | Disposition: A | Discharge status: Home / Self Care | Payer: Medicare HMO | Attending: Emergency Medicine | Admitting: Emergency Medicine

## 2020-01-22 ENCOUNTER — Emergency Department (HOSPITAL_COMMUNITY): Payer: Medicare HMO

## 2020-01-22 ENCOUNTER — Other Ambulatory Visit: Payer: Self-pay

## 2020-01-22 DIAGNOSIS — R112 Nausea with vomiting, unspecified: Secondary | ICD-10-CM | POA: Insufficient documentation

## 2020-01-22 DIAGNOSIS — R0789 Other chest pain: Secondary | ICD-10-CM | POA: Diagnosis not present

## 2020-01-22 DIAGNOSIS — R110 Nausea: Secondary | ICD-10-CM | POA: Diagnosis not present

## 2020-01-22 DIAGNOSIS — E876 Hypokalemia: Secondary | ICD-10-CM | POA: Diagnosis not present

## 2020-01-22 DIAGNOSIS — E039 Hypothyroidism, unspecified: Secondary | ICD-10-CM | POA: Insufficient documentation

## 2020-01-22 DIAGNOSIS — F419 Anxiety disorder, unspecified: Secondary | ICD-10-CM | POA: Diagnosis not present

## 2020-01-22 DIAGNOSIS — Z87891 Personal history of nicotine dependence: Secondary | ICD-10-CM | POA: Insufficient documentation

## 2020-01-22 DIAGNOSIS — I959 Hypotension, unspecified: Secondary | ICD-10-CM | POA: Diagnosis not present

## 2020-01-22 DIAGNOSIS — Z79899 Other long term (current) drug therapy: Secondary | ICD-10-CM | POA: Insufficient documentation

## 2020-01-22 DIAGNOSIS — R8769 Abnormal cytological findings in specimens from other female genital organs: Secondary | ICD-10-CM | POA: Diagnosis not present

## 2020-01-22 DIAGNOSIS — I10 Essential (primary) hypertension: Secondary | ICD-10-CM | POA: Insufficient documentation

## 2020-01-22 DIAGNOSIS — R079 Chest pain, unspecified: Secondary | ICD-10-CM | POA: Diagnosis not present

## 2020-01-22 DIAGNOSIS — E119 Type 2 diabetes mellitus without complications: Secondary | ICD-10-CM | POA: Diagnosis not present

## 2020-01-22 DIAGNOSIS — R0902 Hypoxemia: Secondary | ICD-10-CM | POA: Diagnosis not present

## 2020-01-22 LAB — CBC
HCT: 36.9 % (ref 36.0–46.0)
Hemoglobin: 12.3 g/dL (ref 12.0–15.0)
MCH: 26.5 pg (ref 26.0–34.0)
MCHC: 33.3 g/dL (ref 30.0–36.0)
MCV: 79.4 fL — ABNORMAL LOW (ref 80.0–100.0)
Platelets: 242 10*3/uL (ref 150–400)
RBC: 4.65 MIL/uL (ref 3.87–5.11)
RDW: 14 % (ref 11.5–15.5)
WBC: 4.4 10*3/uL (ref 4.0–10.5)
nRBC: 0 % (ref 0.0–0.2)

## 2020-01-22 LAB — COMPREHENSIVE METABOLIC PANEL
ALT: 18 U/L (ref 0–44)
AST: 25 U/L (ref 15–41)
Albumin: 3.8 g/dL (ref 3.5–5.0)
Alkaline Phosphatase: 56 U/L (ref 38–126)
Anion gap: 14 (ref 5–15)
BUN: 14 mg/dL (ref 8–23)
CO2: 24 mmol/L (ref 22–32)
Calcium: 9.6 mg/dL (ref 8.9–10.3)
Chloride: 105 mmol/L (ref 98–111)
Creatinine, Ser: 1.28 mg/dL — ABNORMAL HIGH (ref 0.44–1.00)
GFR calc Af Amer: 50 mL/min — ABNORMAL LOW (ref >60)
GFR calc non Af Amer: 43 mL/min — ABNORMAL LOW (ref >60)
Glucose, Bld: 131 mg/dL — ABNORMAL HIGH (ref 70–99)
Potassium: 3.1 mmol/L — ABNORMAL LOW (ref 3.5–5.1)
Sodium: 143 mmol/L (ref 135–145)
Total Bilirubin: 1.2 mg/dL (ref 0.3–1.2)
Total Protein: 7.3 g/dL (ref 6.5–8.1)

## 2020-01-22 LAB — URINALYSIS, ROUTINE W REFLEX MICROSCOPIC
Bilirubin Urine: NEGATIVE
Glucose, UA: NEGATIVE mg/dL
Hgb urine dipstick: NEGATIVE
Ketones, ur: NEGATIVE mg/dL
Nitrite: NEGATIVE
Protein, ur: NEGATIVE mg/dL
Specific Gravity, Urine: 1.009 (ref 1.005–1.030)
pH: 8 (ref 5.0–8.0)

## 2020-01-22 LAB — TROPONIN I (HIGH SENSITIVITY)
Troponin I (High Sensitivity): 5 ng/L (ref <18)
Troponin I (High Sensitivity): 10 ng/L (ref <18)

## 2020-01-22 LAB — LIPASE, BLOOD: Lipase: 48 U/L (ref 11–51)

## 2020-01-22 MED ORDER — SODIUM CHLORIDE 0.9 % IV BOLUS
1000.0000 mL | Freq: Once | INTRAVENOUS | Status: AC
  Administered 2020-01-22: 1000 mL via INTRAVENOUS

## 2020-01-22 MED ORDER — POTASSIUM CHLORIDE CRYS ER 20 MEQ PO TBCR
40.0000 meq | EXTENDED_RELEASE_TABLET | Freq: Once | ORAL | Status: AC
  Administered 2020-01-22: 19:00:00 40 meq via ORAL
  Filled 2020-01-22: qty 2

## 2020-01-22 MED ORDER — LIDOCAINE VISCOUS HCL 2 % MT SOLN
15.0000 mL | Freq: Once | OROMUCOSAL | Status: AC
  Administered 2020-01-22: 15 mL via ORAL
  Filled 2020-01-22: qty 15

## 2020-01-22 MED ORDER — ALUM & MAG HYDROXIDE-SIMETH 200-200-20 MG/5ML PO SUSP
30.0000 mL | Freq: Once | ORAL | Status: AC
  Administered 2020-01-22: 30 mL via ORAL
  Filled 2020-01-22: qty 30

## 2020-01-22 NOTE — ED Triage Notes (Signed)
Pt from home with ems for c.o intermittent chest pain for a while now. Pt had episode of pain today while cooking dinner.  Pt had a Holter monitor last month with no significant events during that time. Pt nauseous, 4mg  zofran given by EMS as well as 324 ASA and 2 Nitro without change in pain. Hx of anxiety and was taken off her anxiety meds. Pt a.o, nad noted, VSS

## 2020-01-22 NOTE — ED Notes (Signed)
Discharge instructions discussed with Pt. Pt verbalized understanding. Pt stable upon discharge

## 2020-01-22 NOTE — Discharge Instructions (Signed)
Please take potassium for the next 5 days Drink plenty of fluids Please follow up with your heart doctor and stomach doctor Return if you are worsening

## 2020-01-22 NOTE — ED Provider Notes (Signed)
[facility] EMERGENCY DEPARTMENT Provider Note   CSN: [id number] Arrival date & time: 01/22/20  1606     History Chief Complaint  Patient presents with  . Chest Pain    Alexis Molina is a 69 y.o. female who presents with chest pain. She states that she started to have acute abdominal cramping earlier this afternoon. She tried to lie down but then felt the urge to have a BM. She had a BM and felt a little better. She then had another urge to have a BM and went back to the bathroom and had loose stools. She also got nauseous and was vomiting "phlegm" and was having dry heaves. She also got lightheaded but did not pass out. She then noticed sharp chest pain over the middle of the chest. It was non-radiating but constant. She has problems with chronic intermittent chest pain when she gets worked up and so tried to calm herself down but it wouldn't go away so her husband called EMS. They gave her zofran, ASA, nitro without relief of her symptoms. Currently on my exam she states chest pain has resolved however after talking about her symptoms she burst out crying and states she is having the chest pain again. She denies any recent fever, chills, significant cough, SOB. Her abdominal pain, N/V/D has resolved. She reports some recent dysuria. 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 She also reports that since being off anti-depressants for the past year her stress levels have been high and she just feels generally unwell and hasn't had an appetite. 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 She has a coronary CTA and PFTs scheduled later this month. Also had holter monitoring done recently which shows occasional PACs and PVCs.  HPI     Past Medical History:  Diagnosis Date  . Allergic rhinitis   . Anxiety   . Body mass index 30.0-30.9, adult   . Cataract   . Cataracts, bilateral    . Chest pain   . Depression   . Diabetes mellitus without complication ([place])   . DM (diabetes mellitus) ([place])   . Fatigue   . Fatty liver   . FH: colonic polyps   . Hemangioma of intra-abdominal structures   . Hiatal hernia   . History of colonic polyps   . Hyperlipidemia   . Hypertension   . Hypothyroidism   . IBS (irritable bowel syndrome)   . Obesity, unspecified   . Osteopenia   . 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 Peripheral edema   . Peripheral edema    after knee surgery  . Raynaud's syndrome without gangrene   . Snake bite   . SOB (shortness of breath)   . Thyroid disease   . Tubular adenoma of colon   . Vitamin D deficiency   . Vitreous floaters of right eye     Patient Active Problem List   Diagnosis Date Noted  . Shortness of breath 12/12/2019  . Excessive daytime sleepiness 12/12/2019  . Angina pectoris ([place])   . Diabetes mellitus, type 2 ([place]) 05/11/2015  . Family history of premature CAD 05/11/2015    Past Surgical History:  Procedure Laterality Date  . ABDOMINAL HYSTERECTOMY    . CARDIAC CATHETERIZATION N/A 07/30/2015   Procedure: Left Heart Cath and Coronary Angiography;  Surgeon: Jettie Booze, MD;  Location: [facility];  Service: Cardiovascular;  Laterality: N/A;  . CARPAL TUNNEL RELEASE    . CHOLECYSTECTOMY    . KNEE SURGERY       OB History   No obstetric history on file.     Family History  Problem Relation Age of Onset  . Alzheimer's disease Mother   . Diabetes Mother   . Hypertension Mother   . Ulcers Father   . Hypertension Father   . Arthritis Father   . Heart failure Father   . Lung cancer Brother     Social History   Tobacco Use  . Smoking status: Former Research scientist (life sciences)  . Smokeless tobacco: Never Used  Substance Use Topics  . Alcohol use: No  . Drug use: No    Home Medications Prior to Admission medications   Medication Sig Start Date End Date Taking? Authorizing Provider  albuterol (VENTOLIN  HFA) 108 (90 Base) MCG/ACT inhaler Inhale 2 puffs into the lungs every 4 (four) hours as needed for wheezing or shortness of breath.    [provider]  Cholecalciferol (VITAMIN D) 125 MCG (5000 UT) CAPS Take by mouth.    [provider]  diphenhydrAMINE (BENADRYL) 25 MG tablet Take 25 mg by mouth every 6 (six) hours as needed.    [provider]  fexofenadine (ALLEGRA) 180 MG tablet Take 180 mg by mouth at bedtime.    [provider]  levothyroxine (SYNTHROID, LEVOTHROID) 75 MCG tablet Take 75 mcg by mouth daily before breakfast.    [provider]  lisinopril (PRINIVIL,ZESTRIL) 20 MG tablet Take 20 mg by mouth daily.    [provider]  Melatonin 5 MG TABS Take 1 tablet by mouth at bedtime.    [provider]  metoprolol tartrate (LOPRESSOR) 50 MG tablet Take 1 tablet by mouth 2 hours prior to Cardiac CT 12/21/19   Jettie Booze, MD  montelukast (SINGULAIR) 10 MG tablet Take 10 mg by mouth daily.    [provider]  Multiple Vitamin (MULTIVITAMIN WITH MINERALS) TABS tablet Take 1 tablet by mouth daily.    [provider]  Omega-3 Fatty Acids (FISH OIL) 1000 MG CAPS Take 1 capsule by mouth daily.    [provider]  omeprazole (PRILOSEC) 40 MG capsule Take 40 mg by mouth daily.    [provider]  polycarbophil (FIBERCON) 625 MG tablet Take 625 mg by mouth daily.    [provider]  rosuvastatin (CRESTOR) 20 MG tablet Take 20 mg by mouth daily.    [provider]  triamterene-hydrochlorothiazide (MAXZIDE) 75-50 MG per tablet Take 1 tablet by mouth daily.    [provider]    Allergies    Darvocet [propoxyphene n-acetaminophen], Orange fruit [citrus], Vicodin [hydrocodone-acetaminophen], Dust mite extract, Morphine, Glimepiride, Penicillins, and Sulfa antibiotics  Review of Systems   Review of Systems  Constitutional: Negative for chills and fever.  Respiratory:  Negative for cough and shortness of breath.   Cardiovascular: Positive for chest pain and palpitations. Negative for leg swelling.  Gastrointestinal: Positive for abdominal pain, diarrhea, nausea and vomiting.  Genitourinary: Positive for dysuria. Negative for difficulty urinating and frequency.  Neurological: Positive for light-headedness. Negative for syncope.  Psychiatric/Behavioral: Positive for dysphoric mood. The patient is nervous/anxious.   All other systems reviewed and are negative.   Physical Exam Updated Vital Signs BP 103/81   Pulse 66   Temp 97.9 F (36.6 C) (Oral)   Resp 12   Ht 5\' 2"  (1.575 m)  Wt 68 kg   SpO2 100%   BMI 27.44 kg/m   Physical Exam Vitals and nursing note reviewed.  Constitutional:      General: She is not in acute distress.    Appearance: Normal appearance. She is well-developed. She is not ill-appearing.     Comments: Initially calm and cooperative but becomes very tearful during history  HENT:     Head: Normocephalic and atraumatic.  Eyes:     General: No scleral icterus.       Right eye: No discharge.        Left eye: No discharge.     Conjunctiva/sclera: Conjunctivae normal.     Pupils: Pupils are equal, round, and reactive to light.  Cardiovascular:     Rate and Rhythm: Normal rate and regular rhythm.  Pulmonary:     Effort: Pulmonary effort is normal. No respiratory distress.     Breath sounds: Normal breath sounds.  Abdominal:     General: There is no distension.     Palpations: Abdomen is soft.     Tenderness: There is no abdominal tenderness.  Musculoskeletal:     Cervical back: Normal range of motion.     Right lower leg: No edema.     Left lower leg: No edema.  Skin:    General: Skin is warm and dry.  Neurological:     Mental Status: She is alert and oriented to person, place, and time.  Psychiatric:        Mood and Affect: Mood is anxious.        Behavior: Behavior normal.     ED Results / Procedures / Treatments    Labs (all labs ordered are listed, but only abnormal results are displayed) Labs Reviewed  CBC - Abnormal; Notable for the following components:      Result Value   MCV 79.4 (*)    All other components within normal limits  COMPREHENSIVE METABOLIC PANEL - Abnormal; Notable for the following components:   Potassium 3.1 (*)    Glucose, Bld 131 (*)    Creatinine, Ser 1.28 (*)    GFR calc non Af Amer 43 (*)    GFR calc Af Amer 50 (*)    All other components within normal limits  URINALYSIS, ROUTINE W REFLEX MICROSCOPIC - Abnormal; Notable for the following components:   APPearance HAZY (*)    Leukocytes,Ua MODERATE (*)    Bacteria, UA RARE (*)    All other components within normal limits  LIPASE, BLOOD  TROPONIN I (HIGH SENSITIVITY)  TROPONIN I (HIGH SENSITIVITY)    EKG EKG Interpretation  Date/Time:  Sunday January 22 2020 16:16:15 EST Ventricular Rate:  64 PR Interval:    QRS Duration: 85 QT Interval:  390 QTC Calculation: 403 R Axis:   51 Text Interpretation: Sinus rhythm Ventricular premature complex No significant change since last tracing Confirmed by Blanchie Dessert [id number]) on 01/22/2020 5:11:14 PM   Radiology DG Chest Port 1 View  Result Date: 01/22/2020 CLINICAL DATA:  Intermittent chest pain. EXAM: PORTABLE CHEST 1 VIEW COMPARISON:  Chest radiograph 03/19/2016 FINDINGS: The heart size and mediastinal contours are within normal limits. The lungs are clear. No pneumothorax or pleural effusion. The visualized skeletal structures are unremarkable. IMPRESSION: No active cardiopulmonary disease. Electronically Signed   By: Audie Pinto M.D.   On: 01/22/2020 17:05    Procedures Procedures (including critical care time)  Medications Ordered in ED Medications  sodium chloride 0.9 % bolus 1,000 mL (0  mLs Intravenous Stopped 01/22/20 2137)  potassium chloride SA (KLOR-CON) CR tablet 40 mEq (40 mEq Oral Given 01/22/20 1837)  alum & mag hydroxide-simeth  (MAALOX/MYLANTA) 200-200-20 MG/5ML suspension 30 mL (30 mLs Oral Given 01/22/20 1908)    And  lidocaine (XYLOCAINE) 2 % viscous mouth solution 15 mL (15 mLs Oral Given 01/22/20 1908)    ED Course  I have reviewed the triage vital signs and the nursing notes.  Pertinent labs & imaging results that were available during my care of the patient were reviewed by me and considered in my medical decision making (see chart for details).  69 year old female presents with chest pain.  This was preceded by abdominal cramping, nausea, vomiting, diarrhea.  Blood pressure is stable here and she is mildly bradycardic.  Otherwise vital signs are normal.  On exam she is anxious and tearful.  Lungs are clear to auscultation.  Abdomen is soft and nontender.  She has a recurrence of pain when she talks about her symptoms and gets upset.  EKG is sinus rhythm with PVCs.  Will obtain labs, urine, chest x-ray  CBC is normal.  CMP is remarkable for mild hypokalemia and mild AKI.  This could possibly be from vomiting and diarrhea today as well as poor p.o. intake.  UA is normal.  First troponin is 5.  CXR is clear. Shared visit with Dr. Maryan Rued.  Will give potassium, fluids, GI cocktail and obtain second troponin.  Second troponin is 10.  Patient has no chest pain currently.  Discussed with cardiology who feels that pain is atypical and since she has close follow-up she can see cardiology as an outpatient.  Discussed with patient that she will need to follow-up with cardiology and also recommended seeing GI as well for her ongoing GI issues. Advised take potassium supplements for the next couple of days.  MDM Rules/Calculators/A&P                       Final Clinical Impression(s) / ED Diagnoses Final diagnoses:  Atypical chest pain  Hypokalemia    Rx / DC Orders ED Discharge Orders    None       Recardo Evangelist, PA-C 01/22/20 2333    Blanchie Dessert, MD 01/26/20 1640

## 2020-01-25 ENCOUNTER — Other Ambulatory Visit: Payer: Self-pay

## 2020-01-25 DIAGNOSIS — Z01812 Encounter for preprocedural laboratory examination: Secondary | ICD-10-CM | POA: Diagnosis not present

## 2020-01-25 DIAGNOSIS — R0789 Other chest pain: Secondary | ICD-10-CM

## 2020-01-25 DIAGNOSIS — R6889 Other general symptoms and signs: Secondary | ICD-10-CM | POA: Diagnosis not present

## 2020-01-26 LAB — BASIC METABOLIC PANEL
BUN/Creatinine Ratio: 14 (ref 12–28)
BUN: 15 mg/dL (ref 8–27)
CO2: 26 mmol/L (ref 20–29)
Calcium: 9.8 mg/dL (ref 8.7–10.3)
Chloride: 101 mmol/L (ref 96–106)
Creatinine, Ser: 1.06 mg/dL — ABNORMAL HIGH (ref 0.57–1.00)
GFR calc Af Amer: 63 mL/min/{1.73_m2} (ref >59)
GFR calc non Af Amer: 54 mL/min/{1.73_m2} — ABNORMAL LOW (ref >59)
Glucose: 84 mg/dL (ref 65–99)
Potassium: 3.7 mmol/L (ref 3.5–5.2)
Sodium: 141 mmol/L (ref 134–144)

## 2020-01-27 DIAGNOSIS — R079 Chest pain, unspecified: Secondary | ICD-10-CM | POA: Diagnosis not present

## 2020-01-29 ENCOUNTER — Ambulatory Visit: Payer: Medicare HMO | Attending: Internal Medicine

## 2020-01-29 DIAGNOSIS — Z23 Encounter for immunization: Secondary | ICD-10-CM | POA: Insufficient documentation

## 2020-01-29 DIAGNOSIS — R6889 Other general symptoms and signs: Secondary | ICD-10-CM | POA: Diagnosis not present

## 2020-01-29 NOTE — Progress Notes (Signed)
   Covid-19 Vaccination Clinic  Name:  Alexis Molina    MRN: [id number] DOB: Nov 24, 1951  01/29/2020  [name] was observed post Covid-19 immunization for 15 minutes without incidence. She was provided with Vaccine Information Sheet and instruction to access the V-Safe system.   [name] was instructed to call 911 with any severe reactions post vaccine: Marland Kitchen Difficulty breathing  . Swelling of your face and throat  . A fast heartbeat  . A bad rash all over your body  . Dizziness and weakness    Immunizations Administered    Name Date Dose VIS Date Route   Pfizer COVID-19 Vaccine 01/29/2020  2:25 PM 0.3 mL 11/18/2019 Intramuscular   Manufacturer: [place]   Lot: [id number]   [place]: [id number]

## 2020-01-30 ENCOUNTER — Other Ambulatory Visit (HOSPITAL_COMMUNITY)
Admission: RE | Admit: 2020-01-30 | Discharge: 2020-01-30 | Disposition: A | Discharge status: Home / Self Care | Payer: Medicare HMO | Source: Ambulatory Visit | Attending: Pulmonary Disease | Admitting: Pulmonary Disease

## 2020-01-30 DIAGNOSIS — Z20822 Contact with and (suspected) exposure to covid-19: Secondary | ICD-10-CM | POA: Insufficient documentation

## 2020-01-30 DIAGNOSIS — Z01812 Encounter for preprocedural laboratory examination: Secondary | ICD-10-CM | POA: Insufficient documentation

## 2020-01-30 DIAGNOSIS — R6889 Other general symptoms and signs: Secondary | ICD-10-CM | POA: Diagnosis not present

## 2020-01-30 LAB — SARS CORONAVIRUS 2 (TAT 6-24 HRS): SARS Coronavirus 2: NEGATIVE

## 2020-01-31 ENCOUNTER — Telehealth (HOSPITAL_COMMUNITY): Payer: Self-pay | Admitting: Emergency Medicine

## 2020-01-31 ENCOUNTER — Encounter (HOSPITAL_COMMUNITY): Payer: Self-pay

## 2020-01-31 NOTE — Telephone Encounter (Signed)
Left message on voicemail with name and callback number Arian Mcquitty RN Navigator Cardiac Imaging [facility] [phone] Office [phone] Cell  

## 2020-02-01 ENCOUNTER — Other Ambulatory Visit: Payer: Self-pay

## 2020-02-01 ENCOUNTER — Encounter (HOSPITAL_COMMUNITY): Payer: Self-pay

## 2020-02-01 ENCOUNTER — Ambulatory Visit (HOSPITAL_COMMUNITY)
Admission: RE | Admit: 2020-02-01 | Discharge: 2020-02-01 | Disposition: A | Discharge status: Home / Self Care | Payer: Medicare HMO | Source: Ambulatory Visit | Attending: Interventional Cardiology | Admitting: Interventional Cardiology

## 2020-02-01 DIAGNOSIS — R072 Precordial pain: Secondary | ICD-10-CM

## 2020-02-01 DIAGNOSIS — R6889 Other general symptoms and signs: Secondary | ICD-10-CM | POA: Diagnosis not present

## 2020-02-01 MED ORDER — NITROGLYCERIN 0.4 MG SL SUBL
SUBLINGUAL_TABLET | SUBLINGUAL | Status: AC
  Filled 2020-02-01: qty 2

## 2020-02-01 MED ORDER — NITROGLYCERIN 0.4 MG SL SUBL
0.8000 mg | SUBLINGUAL_TABLET | Freq: Once | SUBLINGUAL | Status: AC
  Administered 2020-02-01: 0.8 mg via SUBLINGUAL

## 2020-02-01 MED ORDER — IOHEXOL 350 MG/ML SOLN
80.0000 mL | Freq: Once | INTRAVENOUS | Status: AC | PRN
  Administered 2020-02-01: 80 mL via INTRAVENOUS

## 2020-02-01 NOTE — Progress Notes (Signed)
Patient tolerated CT well. Drank soda and ate cookies after. Ambulated to exit steady gait.

## 2020-02-02 ENCOUNTER — Ambulatory Visit (INDEPENDENT_AMBULATORY_CARE_PROVIDER_SITE_OTHER): Payer: Medicare HMO | Admitting: Pulmonary Disease

## 2020-02-02 DIAGNOSIS — R6889 Other general symptoms and signs: Secondary | ICD-10-CM | POA: Diagnosis not present

## 2020-02-02 DIAGNOSIS — R0602 Shortness of breath: Secondary | ICD-10-CM

## 2020-02-02 LAB — PULMONARY FUNCTION TEST
DL/VA % pred: 110 %
DL/VA: 4.63 ml/min/mmHg/L
DLCO cor % pred: 100 %
DLCO cor: 19.04 ml/min/mmHg
DLCO unc % pred: 96 %
DLCO unc: 18.36 ml/min/mmHg
FEF 25-75 Post: 2.74 L/sec
FEF 25-75 Pre: 2.75 L/sec
FEF2575-%Change-Post: 0 %
FEF2575-%Pred-Post: 157 %
FEF2575-%Pred-Pre: 158 %
FEV1-%Change-Post: 0 %
FEV1-%Pred-Post: 120 %
FEV1-%Pred-Pre: 120 %
FEV1-Post: 2.21 L
FEV1-Pre: 2.21 L
FEV1FVC-%Change-Post: 2 %
FEV1FVC-%Pred-Pre: 107 %
FEV6-%Change-Post: -2 %
FEV6-%Pred-Post: 112 %
FEV6-%Pred-Pre: 116 %
FEV6-Post: 2.55 L
FEV6-Pre: 2.62 L
FEV6FVC-%Pred-Post: 104 %
FEV6FVC-%Pred-Pre: 104 %
FVC-%Change-Post: -2 %
FVC-%Pred-Post: 108 %
FVC-%Pred-Pre: 111 %
FVC-Post: 2.55 L
FVC-Pre: 2.62 L
Post FEV1/FVC ratio: 87 %
Post FEV6/FVC ratio: 100 %
Pre FEV1/FVC ratio: 84 %
Pre FEV6/FVC Ratio: 100 %
RV % pred: 88 %
RV: 1.83 L
TLC % pred: 92 %
TLC: 4.54 L

## 2020-02-02 NOTE — Progress Notes (Signed)
Full PFT performed today. °

## 2020-02-03 ENCOUNTER — Telehealth: Payer: Self-pay | Admitting: *Deleted

## 2020-02-03 DIAGNOSIS — R6889 Other general symptoms and signs: Secondary | ICD-10-CM | POA: Diagnosis not present

## 2020-02-03 DIAGNOSIS — E039 Hypothyroidism, unspecified: Secondary | ICD-10-CM | POA: Diagnosis not present

## 2020-02-03 DIAGNOSIS — E785 Hyperlipidemia, unspecified: Secondary | ICD-10-CM | POA: Diagnosis not present

## 2020-02-03 DIAGNOSIS — R079 Chest pain, unspecified: Secondary | ICD-10-CM | POA: Diagnosis not present

## 2020-02-03 DIAGNOSIS — N289 Disorder of kidney and ureter, unspecified: Secondary | ICD-10-CM | POA: Diagnosis not present

## 2020-02-03 DIAGNOSIS — E876 Hypokalemia: Secondary | ICD-10-CM | POA: Diagnosis not present

## 2020-02-03 NOTE — Telephone Encounter (Signed)
-----   Message from [address], MD sent at 02/02/2020 11:53 AM EST ----- Please call patient regarding results: PFTs normal.   Ensure patient has home sleep study ordered from prior visit and schedule for follow-up to discuss results one week after study is completed. Can see me or NP  JE

## 2020-02-03 NOTE — Telephone Encounter (Signed)
Called and spoke with patient.  Let her someone would call her regarding her sleep study as it was ordered at beginning of January and she hasn't heard anything.  Patient aware of PFT results,   will route to [facility] for follow up on sleep study

## 2020-02-06 DIAGNOSIS — H04123 Dry eye syndrome of bilateral lacrimal glands: Secondary | ICD-10-CM | POA: Diagnosis not present

## 2020-02-06 DIAGNOSIS — H40053 Ocular hypertension, bilateral: Secondary | ICD-10-CM | POA: Diagnosis not present

## 2020-02-06 DIAGNOSIS — Z961 Presence of intraocular lens: Secondary | ICD-10-CM | POA: Diagnosis not present

## 2020-02-06 DIAGNOSIS — H524 Presbyopia: Secondary | ICD-10-CM | POA: Diagnosis not present

## 2020-02-06 NOTE — Telephone Encounter (Signed)
Spoke to pt & made her aware we have the precert from [facility] now and someone will be calling her to set up hst in next few days.  Nothing further needed.

## 2020-02-06 NOTE — Telephone Encounter (Signed)
Sleep study was ordered 1/4 and Suanne Marker put note on order 1/5 that she had to send info to [facility].  I just sent her teams message to see if she has heard from [facility].

## 2020-02-06 NOTE — Telephone Encounter (Signed)
Rhonda called [employer] & was able to get precert for hst.  She is going to fax info to [facility] this afternoon with other studies.  Tried to call pt to make her aware someone should be calling her in next few days to set up study.  Had to leave a vm for her to call me back.

## 2020-02-06 NOTE — Telephone Encounter (Signed)
Alexis Molina states she has not heard back from [facility] and she will call them this afternoon.

## 2020-02-13 ENCOUNTER — Other Ambulatory Visit: Payer: Self-pay

## 2020-02-13 ENCOUNTER — Ambulatory Visit: Payer: Medicare HMO

## 2020-02-13 DIAGNOSIS — G4734 Idiopathic sleep related nonobstructive alveolar hypoventilation: Secondary | ICD-10-CM

## 2020-02-13 DIAGNOSIS — G4710 Hypersomnia, unspecified: Secondary | ICD-10-CM

## 2020-02-14 ENCOUNTER — Telehealth: Payer: Self-pay | Admitting: Pulmonary Disease

## 2020-02-14 DIAGNOSIS — E782 Mixed hyperlipidemia: Secondary | ICD-10-CM | POA: Diagnosis not present

## 2020-02-14 DIAGNOSIS — E1165 Type 2 diabetes mellitus with hyperglycemia: Secondary | ICD-10-CM | POA: Diagnosis not present

## 2020-02-14 DIAGNOSIS — I10 Essential (primary) hypertension: Secondary | ICD-10-CM | POA: Diagnosis not present

## 2020-02-14 DIAGNOSIS — E1169 Type 2 diabetes mellitus with other specified complication: Secondary | ICD-10-CM | POA: Diagnosis not present

## 2020-02-14 DIAGNOSIS — E039 Hypothyroidism, unspecified: Secondary | ICD-10-CM | POA: Diagnosis not present

## 2020-02-14 DIAGNOSIS — E785 Hyperlipidemia, unspecified: Secondary | ICD-10-CM | POA: Diagnosis not present

## 2020-02-14 DIAGNOSIS — E119 Type 2 diabetes mellitus without complications: Secondary | ICD-10-CM | POA: Diagnosis not present

## 2020-02-14 DIAGNOSIS — G4733 Obstructive sleep apnea (adult) (pediatric): Secondary | ICD-10-CM

## 2020-02-14 NOTE — Telephone Encounter (Signed)
Dr. Ander Slade has reviewed the home sleep test this test was negative for sleep apnea.  Dr . Ander Slade recommendation are regular exercise, encourage weight loss efforts, caution driving when sleepy, and against medications with sedative side effects  Called and spoke with Patient.  Dr. Ander Slade results and recommendations given.  Understanding stated.  Nothing further at this time.

## 2020-02-22 ENCOUNTER — Ambulatory Visit: Payer: Medicare HMO | Attending: Internal Medicine

## 2020-02-22 DIAGNOSIS — Z23 Encounter for immunization: Secondary | ICD-10-CM

## 2020-02-22 NOTE — Progress Notes (Signed)
   Covid-19 Vaccination Clinic  Name:  KASSADI GELLINGS    MRN: [id number] DOB: [birth year]  02/22/2020  Ms. Bruhl was observed post Covid-19 immunization for 15 minutes without incident. She was provided with Vaccine Information Sheet and instruction to access the V-Safe system.   Ms. Filipovic was instructed to call 911 with any severe reactions post vaccine: Marland Kitchen Difficulty breathing  . Swelling of face and throat  . A fast heartbeat  . A bad rash all over body  . Dizziness and weakness   Immunizations Administered    Name Date Dose VIS Date Route   Pfizer COVID-19 Vaccine 02/22/2020 10:56 AM 0.3 mL 11/18/2019 Intramuscular   Manufacturer: [place]   Lot: [id number]   [place]: [id number]

## 2020-03-12 DIAGNOSIS — E1165 Type 2 diabetes mellitus with hyperglycemia: Secondary | ICD-10-CM | POA: Diagnosis not present

## 2020-03-12 DIAGNOSIS — I10 Essential (primary) hypertension: Secondary | ICD-10-CM | POA: Diagnosis not present

## 2020-03-12 DIAGNOSIS — E785 Hyperlipidemia, unspecified: Secondary | ICD-10-CM | POA: Diagnosis not present

## 2020-03-12 DIAGNOSIS — E039 Hypothyroidism, unspecified: Secondary | ICD-10-CM | POA: Diagnosis not present

## 2020-03-12 DIAGNOSIS — E119 Type 2 diabetes mellitus without complications: Secondary | ICD-10-CM | POA: Diagnosis not present

## 2020-03-12 DIAGNOSIS — E782 Mixed hyperlipidemia: Secondary | ICD-10-CM | POA: Diagnosis not present

## 2020-03-12 DIAGNOSIS — E1169 Type 2 diabetes mellitus with other specified complication: Secondary | ICD-10-CM | POA: Diagnosis not present

## 2020-04-24 DIAGNOSIS — I10 Essential (primary) hypertension: Secondary | ICD-10-CM | POA: Diagnosis not present

## 2020-04-24 DIAGNOSIS — E119 Type 2 diabetes mellitus without complications: Secondary | ICD-10-CM | POA: Diagnosis not present

## 2020-04-24 DIAGNOSIS — E1165 Type 2 diabetes mellitus with hyperglycemia: Secondary | ICD-10-CM | POA: Diagnosis not present

## 2020-04-24 DIAGNOSIS — E782 Mixed hyperlipidemia: Secondary | ICD-10-CM | POA: Diagnosis not present

## 2020-04-24 DIAGNOSIS — E039 Hypothyroidism, unspecified: Secondary | ICD-10-CM | POA: Diagnosis not present

## 2020-04-24 DIAGNOSIS — E785 Hyperlipidemia, unspecified: Secondary | ICD-10-CM | POA: Diagnosis not present

## 2020-04-24 DIAGNOSIS — E1169 Type 2 diabetes mellitus with other specified complication: Secondary | ICD-10-CM | POA: Diagnosis not present

## 2020-06-06 DIAGNOSIS — E782 Mixed hyperlipidemia: Secondary | ICD-10-CM | POA: Diagnosis not present

## 2020-06-06 DIAGNOSIS — I10 Essential (primary) hypertension: Secondary | ICD-10-CM | POA: Diagnosis not present

## 2020-06-06 DIAGNOSIS — E039 Hypothyroidism, unspecified: Secondary | ICD-10-CM | POA: Diagnosis not present

## 2020-06-06 DIAGNOSIS — E1169 Type 2 diabetes mellitus with other specified complication: Secondary | ICD-10-CM | POA: Diagnosis not present

## 2020-06-06 DIAGNOSIS — E1165 Type 2 diabetes mellitus with hyperglycemia: Secondary | ICD-10-CM | POA: Diagnosis not present

## 2020-06-06 DIAGNOSIS — E119 Type 2 diabetes mellitus without complications: Secondary | ICD-10-CM | POA: Diagnosis not present

## 2020-06-06 DIAGNOSIS — E785 Hyperlipidemia, unspecified: Secondary | ICD-10-CM | POA: Diagnosis not present

## 2020-06-15 DIAGNOSIS — H6123 Impacted cerumen, bilateral: Secondary | ICD-10-CM | POA: Diagnosis not present

## 2020-06-15 DIAGNOSIS — R6889 Other general symptoms and signs: Secondary | ICD-10-CM | POA: Diagnosis not present

## 2020-07-05 DIAGNOSIS — E782 Mixed hyperlipidemia: Secondary | ICD-10-CM | POA: Diagnosis not present

## 2020-07-05 DIAGNOSIS — E119 Type 2 diabetes mellitus without complications: Secondary | ICD-10-CM | POA: Diagnosis not present

## 2020-07-05 DIAGNOSIS — E039 Hypothyroidism, unspecified: Secondary | ICD-10-CM | POA: Diagnosis not present

## 2020-07-05 DIAGNOSIS — E1165 Type 2 diabetes mellitus with hyperglycemia: Secondary | ICD-10-CM | POA: Diagnosis not present

## 2020-07-05 DIAGNOSIS — E1169 Type 2 diabetes mellitus with other specified complication: Secondary | ICD-10-CM | POA: Diagnosis not present

## 2020-07-05 DIAGNOSIS — E785 Hyperlipidemia, unspecified: Secondary | ICD-10-CM | POA: Diagnosis not present

## 2020-07-05 DIAGNOSIS — I10 Essential (primary) hypertension: Secondary | ICD-10-CM | POA: Diagnosis not present

## 2020-08-16 ENCOUNTER — Encounter (INDEPENDENT_AMBULATORY_CARE_PROVIDER_SITE_OTHER): Payer: Medicare HMO | Admitting: Ophthalmology

## 2020-08-16 DIAGNOSIS — E785 Hyperlipidemia, unspecified: Secondary | ICD-10-CM | POA: Diagnosis not present

## 2020-08-16 DIAGNOSIS — I10 Essential (primary) hypertension: Secondary | ICD-10-CM | POA: Diagnosis not present

## 2020-08-16 DIAGNOSIS — E119 Type 2 diabetes mellitus without complications: Secondary | ICD-10-CM | POA: Diagnosis not present

## 2020-08-16 DIAGNOSIS — E782 Mixed hyperlipidemia: Secondary | ICD-10-CM | POA: Diagnosis not present

## 2020-08-16 DIAGNOSIS — E1169 Type 2 diabetes mellitus with other specified complication: Secondary | ICD-10-CM | POA: Diagnosis not present

## 2020-08-16 DIAGNOSIS — E1165 Type 2 diabetes mellitus with hyperglycemia: Secondary | ICD-10-CM | POA: Diagnosis not present

## 2020-08-16 DIAGNOSIS — E039 Hypothyroidism, unspecified: Secondary | ICD-10-CM | POA: Diagnosis not present

## 2020-08-17 DIAGNOSIS — R6889 Other general symptoms and signs: Secondary | ICD-10-CM | POA: Diagnosis not present

## 2020-08-30 ENCOUNTER — Encounter (INDEPENDENT_AMBULATORY_CARE_PROVIDER_SITE_OTHER): Payer: Medicare HMO | Admitting: Ophthalmology

## 2020-09-11 ENCOUNTER — Encounter (INDEPENDENT_AMBULATORY_CARE_PROVIDER_SITE_OTHER): Payer: Self-pay | Admitting: Ophthalmology

## 2020-09-11 ENCOUNTER — Ambulatory Visit (INDEPENDENT_AMBULATORY_CARE_PROVIDER_SITE_OTHER): Payer: Medicare HMO | Admitting: Ophthalmology

## 2020-09-11 ENCOUNTER — Other Ambulatory Visit: Payer: Self-pay

## 2020-09-11 ENCOUNTER — Encounter (INDEPENDENT_AMBULATORY_CARE_PROVIDER_SITE_OTHER): Payer: Medicare HMO | Admitting: Ophthalmology

## 2020-09-11 DIAGNOSIS — H35073 Retinal telangiectasis, bilateral: Secondary | ICD-10-CM | POA: Insufficient documentation

## 2020-09-11 DIAGNOSIS — E119 Type 2 diabetes mellitus without complications: Secondary | ICD-10-CM | POA: Diagnosis not present

## 2020-09-11 DIAGNOSIS — H43813 Vitreous degeneration, bilateral: Secondary | ICD-10-CM

## 2020-09-11 DIAGNOSIS — R6889 Other general symptoms and signs: Secondary | ICD-10-CM | POA: Diagnosis not present

## 2020-09-11 DIAGNOSIS — H43811 Vitreous degeneration, right eye: Secondary | ICD-10-CM | POA: Insufficient documentation

## 2020-09-11 DIAGNOSIS — H43821 Vitreomacular adhesion, right eye: Secondary | ICD-10-CM

## 2020-09-11 DIAGNOSIS — H43391 Other vitreous opacities, right eye: Secondary | ICD-10-CM

## 2020-09-11 HISTORY — DX: Vitreous degeneration, bilateral: H43.813

## 2020-09-11 HISTORY — DX: Other vitreous opacities, right eye: H43.391

## 2020-09-11 HISTORY — DX: Retinal telangiectasis, bilateral: H35.073

## 2020-09-11 HISTORY — DX: Vitreous degeneration, right eye: H43.811

## 2020-09-11 HISTORY — DX: Vitreomacular adhesion, right eye: H43.821

## 2020-09-11 NOTE — Progress Notes (Signed)
09/11/2020     CHIEF COMPLAINT Patient presents for Retina Follow Up   HISTORY OF PRESENT ILLNESS: Alexis Molina is a 69 y.o. 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  Severity is moderate.  Duration of 1 year.  Since onset it is stable.  I, the attending physician,  performed the HPI with the patient and updated documentation appropriately.          Comments    1 Year f\u OU. OCT  Pt c/o blurry vision. Pt states this has been going on since the beginning of the year. Pt states it takes a while to focus. Pt c/o seeing floaters OU.       Last edited by Tilda Franco on 09/11/2020  3:17 PM. (History)      Referring physician: Hulan Fess, MD [place],  [place] [address]  HISTORICAL INFORMATION:   Selected notes from the MEDICAL RECORD NUMBER       CURRENT MEDICATIONS: No current outpatient medications on file. (Ophthalmic Drugs)   No current facility-administered medications for this visit. (Ophthalmic Drugs)   Current Outpatient Medications (Other)  Medication Sig  . albuterol (VENTOLIN HFA) 108 (90 Base) MCG/ACT inhaler Inhale 2 puffs into the lungs every 4 (four) hours as needed for wheezing or shortness of breath.  . Cholecalciferol (VITAMIN D) 125 MCG (5000 UT) CAPS Take by mouth.  . diphenhydrAMINE (BENADRYL) 25 MG tablet Take 25 mg by mouth every 6 (six) hours as needed.  . fexofenadine (ALLEGRA) 180 MG tablet Take 180 mg by mouth at bedtime.  Marland Kitchen levothyroxine (SYNTHROID, LEVOTHROID) 75 MCG tablet Take 75 mcg by mouth daily before breakfast.  . lisinopril (PRINIVIL,ZESTRIL) 20 MG tablet Take 20 mg by mouth daily.  . Melatonin 5 MG TABS Take 1 tablet by mouth at bedtime.  . metoprolol tartrate (LOPRESSOR) 50 MG tablet Take 1 tablet by mouth 2 hours prior to Cardiac CT  . montelukast (SINGULAIR) 10 MG tablet Take 10 mg by mouth daily.  . Multiple Vitamin (MULTIVITAMIN  WITH MINERALS) TABS tablet Take 1 tablet by mouth daily.  . Omega-3 Fatty Acids (FISH OIL) 1000 MG CAPS Take 1 capsule by mouth daily.  Marland Kitchen omeprazole (PRILOSEC) 40 MG capsule Take 40 mg by mouth daily.  . polycarbophil (FIBERCON) 625 MG tablet Take 625 mg by mouth daily.  . rosuvastatin (CRESTOR) 20 MG tablet Take 20 mg by mouth daily.  Marland Kitchen triamterene-hydrochlorothiazide (MAXZIDE) 75-50 MG per tablet Take 1 tablet by mouth daily.   No current facility-administered medications for this visit. (Other)      REVIEW OF SYSTEMS:    ALLERGIES Allergies  Allergen Reactions  . Darvocet [Propoxyphene N-Acetaminophen] Other (See Comments)    Asthma attack  . Orange Fruit [Citrus] Itching and Swelling  . Vicodin [Hydrocodone-Acetaminophen] Hives and Itching  . Dust Mite Extract Itching  . Morphine Itching  . Glimepiride Itching  . Penicillins Hives  . Sulfa Antibiotics Hives    PAST MEDICAL HISTORY Past Medical History:  Diagnosis Date  . Allergic rhinitis   . Anxiety   . Body mass index 30.0-30.9, adult   . Cataract   . Cataracts, bilateral   . Chest pain   . Depression   . Diabetes mellitus without complication ([place])   . DM (diabetes mellitus) ([place])   . Fatigue   . Fatty liver   .  FH: colonic polyps   . Hemangioma of intra-abdominal structures   . Hiatal hernia   . History of colonic polyps   . Hyperlipidemia   . Hypertension   . Hypothyroidism   . IBS (irritable bowel syndrome)   . Obesity, unspecified   . Osteopenia   . 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 Peripheral edema   . Peripheral edema    after knee surgery  . Raynaud's syndrome without gangrene   . Snake bite   . SOB (shortness of breath)   . Thyroid disease   . Tubular adenoma of colon   . Vitamin D deficiency   . Vitreous floaters of right eye    Past Surgical History:  Procedure Laterality Date  . ABDOMINAL HYSTERECTOMY    . CARDIAC CATHETERIZATION N/A 07/30/2015   Procedure: Left Heart  Cath and Coronary Angiography;  Surgeon: Jettie Booze, MD;  Location: [facility];  Service: Cardiovascular;  Laterality: N/A;  . CARPAL TUNNEL RELEASE    . CHOLECYSTECTOMY    . KNEE SURGERY      FAMILY HISTORY Family History  Problem Relation Age of Onset  . Alzheimer's disease Mother   . Diabetes Mother   . Hypertension Mother   . Ulcers Father   . Hypertension Father   . Arthritis Father   . Heart failure Father   . Lung cancer Brother     SOCIAL HISTORY Social History   Tobacco Use  . Smoking status: Former Research scientist (life sciences)  . Smokeless tobacco: Never Used  Vaping Use  . Vaping Use: Never used  Substance Use Topics  . Alcohol use: No  . Drug use: No         OPHTHALMIC EXAM:  Base Eye Exam    Visual Acuity (Snellen - Linear)      Right Left   Dist [place] 20/25 20/40 -1   Dist ph [place]  NI       Tonometry (Tonopen, 3:19 PM)      Right Left   Pressure 15 22       Tonometry #2 (Tonopen, 3:19 PM)      Right Left   Pressure  22       Pupils      Pupils Dark Light Shape React APD   Right PERRL 4 3 Round Brisk None   Left PERRL 4 3 Round 4 None       Visual Fields (Counting fingers)      Left Right    Full Full       Neuro/Psych    Oriented x3: Yes   Mood/Affect: Normal       Dilation    Both eyes: 1.0% Mydriacyl, 2.5% Phenylephrine @ 3:20 PM        Slit Lamp and Fundus Exam    External Exam      Right Left   External Normal Normal       Slit Lamp Exam      Right Left   Lids/Lashes Normal Normal   Conjunctiva/Sclera White and quiet White and quiet   Cornea Clear Clear   Anterior Chamber Deep and quiet Deep and quiet   Iris Round and reactive Round and reactive   Lens Centered posterior chamber intraocular lens, multifocal    Anterior Vitreous Normal Normal       Fundus Exam      Right Left   Posterior Vitreous Normal Posterior vitreous detachment   Disc Normal Normal   C/D  Ratio 0.35 0.4   Macula Normal, no hemorrhage minor  atrophy centrally   Vessels no DR no DR   Periphery Normal Normal          IMAGING AND PROCEDURES  Imaging and Procedures for 09/11/20  OCT, Retina - OU - Both Eyes       Right Eye Quality was good. Scan locations included subfoveal. Central Foveal Thickness: 281. Progression has been stable. Findings include normal foveal contour.   Left Eye Scan locations included subfoveal. Central Foveal Thickness: 283. Progression has been stable. Findings include normal foveal contour.   Notes Incidental posterior vitreous detachment noted left eye                ASSESSMENT/PLAN:  Diabetes mellitus, type 2 The patient has diabetes without any evidence of retinopathy. The patient advised to maintain good blood glucose control, excellent blood pressure control, and favorable levels of cholesterol, low density lipoprotein, and high density lipoproteins. Follow up in 1 year was recommended. Explained that fluctuations in visual acuity , or "out of focus", may result from large variations of blood sugar control.      ICD-10-CM   1. Type 2 diabetes mellitus without complication, without long-term current use of insulin (HCC)  E11.9   2. 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 Vitreomacular adhesion of right eye  H43.821   4. Other vitreous opacities, right eye  H43.391   5. Posterior vitreous detachment of both eyes  H43.813     1. No active maculopathy, no active diabetic retinopathy  2.  3.  Ophthalmic Meds Ordered this visit:  No orders of the defined types were placed in this encounter.      Return in about 1 year (around 09/11/2021) for DILATE OU, OCT.  There are no Patient Instructions on file for this visit.   Explained the diagnoses, plan, and follow up with the patient and they expressed understanding.  Patient expressed understanding of the importance of proper follow up care.   Clent Demark Sorah Falkenstein M.D. Diseases & Surgery of  the Retina and Vitreous [facility]ina [facility] Di[facility] [place] 09/11/20     Abbreviations: M myopia (nearsighted); A astigmatism; H hyperopia (farsighted); P presbyopia; Mrx spectacle prescription;  CTL contact lenses; OD right eye; OS left eye; OU both eyes  XT exotropia; ET esotropia; PEK punctate epithelial keratitis; PEE punctate epithelial erosions; DES dry eye syndrome; MGD meibomian gland dysfunction; ATs artificial tears; PFAT's preservative free artificial tears; [place] nuclear sclerotic cataract; PSC posterior subcapsular cataract; ERM epi-retinal membrane; PVD posterior vitreous detachment; RD retinal detachment; DM diabetes mellitus; DR diabetic retinopathy; NPDR non-proliferative diabetic retinopathy; PDR proliferative diabetic retinopathy; CSME clinically significant macular edema; DME diabetic macular edema; dbh dot blot hemorrhages; CWS cotton wool spot; POAG primary open angle glaucoma; C/D cup-to-disc ratio; HVF humphrey visual field; GVF goldmann visual field; OCT optical coherence tomography; IOP intraocular pressure; BRVO Branch retinal vein occlusion; CRVO central retinal vein occlusion; CRAO central retinal artery occlusion; BRAO branch retinal artery occlusion; RT retinal tear; SB scleral buckle; PPV pars plana vitrectomy; VH Vitreous hemorrhage; PRP panretinal laser photocoagulation; IVK intravitreal kenalog; VMT vitreomacular traction; MH Macular hole;  NVD neovascularization of the disc; NVE neovascularization elsewhere; AREDS age related eye disease study; ARMD age related macular degeneration; POAG primary open angle glaucoma; EBMD epithelial/anterior basement membrane dystrophy; ACIOL anterior chamber intraocular lens; IOL intraocular lens; PCIOL posterior chamber intraocular lens; Phaco/IOL phacoemulsification with intraocular lens placement;  [place] photorefractive keratectomy; LASIK laser assisted in situ keratomileusis; HTN hypertension; DM diabetes mellitus; COPD chronic obstructive  pulmonary disease

## 2020-09-11 NOTE — Assessment & Plan Note (Signed)

## 2020-09-12 DIAGNOSIS — E782 Mixed hyperlipidemia: Secondary | ICD-10-CM | POA: Diagnosis not present

## 2020-09-12 DIAGNOSIS — E1165 Type 2 diabetes mellitus with hyperglycemia: Secondary | ICD-10-CM | POA: Diagnosis not present

## 2020-09-12 DIAGNOSIS — E119 Type 2 diabetes mellitus without complications: Secondary | ICD-10-CM | POA: Diagnosis not present

## 2020-09-12 DIAGNOSIS — E039 Hypothyroidism, unspecified: Secondary | ICD-10-CM | POA: Diagnosis not present

## 2020-09-12 DIAGNOSIS — E1169 Type 2 diabetes mellitus with other specified complication: Secondary | ICD-10-CM | POA: Diagnosis not present

## 2020-09-12 DIAGNOSIS — D500 Iron deficiency anemia secondary to blood loss (chronic): Secondary | ICD-10-CM | POA: Diagnosis not present

## 2020-09-12 DIAGNOSIS — I10 Essential (primary) hypertension: Secondary | ICD-10-CM | POA: Diagnosis not present

## 2020-09-12 DIAGNOSIS — E785 Hyperlipidemia, unspecified: Secondary | ICD-10-CM | POA: Diagnosis not present

## 2020-09-25 DIAGNOSIS — M549 Dorsalgia, unspecified: Secondary | ICD-10-CM | POA: Diagnosis not present

## 2020-09-28 ENCOUNTER — Emergency Department (HOSPITAL_BASED_OUTPATIENT_CLINIC_OR_DEPARTMENT_OTHER): Payer: Medicare HMO

## 2020-09-28 ENCOUNTER — Inpatient Hospital Stay (HOSPITAL_BASED_OUTPATIENT_CLINIC_OR_DEPARTMENT_OTHER)
Admission: EM | Admit: 2020-09-28 | Discharge: 2020-10-03 | DRG: 392 | Disposition: A | Discharge status: Home / Self Care | Payer: Medicare HMO | Attending: Internal Medicine | Admitting: Internal Medicine

## 2020-09-28 ENCOUNTER — Encounter (HOSPITAL_BASED_OUTPATIENT_CLINIC_OR_DEPARTMENT_OTHER): Payer: Self-pay

## 2020-09-28 ENCOUNTER — Other Ambulatory Visit: Payer: Self-pay

## 2020-09-28 DIAGNOSIS — E1122 Type 2 diabetes mellitus with diabetic chronic kidney disease: Secondary | ICD-10-CM | POA: Diagnosis present

## 2020-09-28 DIAGNOSIS — R109 Unspecified abdominal pain: Secondary | ICD-10-CM | POA: Diagnosis not present

## 2020-09-28 DIAGNOSIS — Z79890 Hormone replacement therapy (postmenopausal): Secondary | ICD-10-CM | POA: Diagnosis not present

## 2020-09-28 DIAGNOSIS — R634 Abnormal weight loss: Secondary | ICD-10-CM | POA: Diagnosis not present

## 2020-09-28 DIAGNOSIS — Z20822 Contact with and (suspected) exposure to covid-19: Secondary | ICD-10-CM | POA: Diagnosis present

## 2020-09-28 DIAGNOSIS — Z9049 Acquired absence of other specified parts of digestive tract: Secondary | ICD-10-CM

## 2020-09-28 DIAGNOSIS — E876 Hypokalemia: Secondary | ICD-10-CM | POA: Diagnosis present

## 2020-09-28 DIAGNOSIS — F32A Depression, unspecified: Secondary | ICD-10-CM | POA: Diagnosis present

## 2020-09-28 DIAGNOSIS — Z91048 Other nonmedicinal substance allergy status: Secondary | ICD-10-CM

## 2020-09-28 DIAGNOSIS — N182 Chronic kidney disease, stage 2 (mild): Secondary | ICD-10-CM | POA: Diagnosis present

## 2020-09-28 DIAGNOSIS — K2900 Acute gastritis without bleeding: Principal | ICD-10-CM | POA: Diagnosis present

## 2020-09-28 DIAGNOSIS — K3189 Other diseases of stomach and duodenum: Secondary | ICD-10-CM | POA: Diagnosis not present

## 2020-09-28 DIAGNOSIS — Z885 Allergy status to narcotic agent status: Secondary | ICD-10-CM

## 2020-09-28 DIAGNOSIS — I129 Hypertensive chronic kidney disease with stage 1 through stage 4 chronic kidney disease, or unspecified chronic kidney disease: Secondary | ICD-10-CM | POA: Diagnosis present

## 2020-09-28 DIAGNOSIS — E039 Hypothyroidism, unspecified: Secondary | ICD-10-CM | POA: Diagnosis present

## 2020-09-28 DIAGNOSIS — K2950 Unspecified chronic gastritis without bleeding: Secondary | ICD-10-CM | POA: Diagnosis not present

## 2020-09-28 DIAGNOSIS — E890 Postprocedural hypothyroidism: Secondary | ICD-10-CM | POA: Diagnosis present

## 2020-09-28 DIAGNOSIS — F419 Anxiety disorder, unspecified: Secondary | ICD-10-CM | POA: Diagnosis present

## 2020-09-28 DIAGNOSIS — H43813 Vitreous degeneration, bilateral: Secondary | ICD-10-CM | POA: Diagnosis present

## 2020-09-28 DIAGNOSIS — Z882 Allergy status to sulfonamides status: Secondary | ICD-10-CM

## 2020-09-28 DIAGNOSIS — Z87891 Personal history of nicotine dependence: Secondary | ICD-10-CM

## 2020-09-28 DIAGNOSIS — I10 Essential (primary) hypertension: Secondary | ICD-10-CM | POA: Diagnosis present

## 2020-09-28 DIAGNOSIS — Z90710 Acquired absence of both cervix and uterus: Secondary | ICD-10-CM

## 2020-09-28 DIAGNOSIS — J309 Allergic rhinitis, unspecified: Secondary | ICD-10-CM | POA: Diagnosis present

## 2020-09-28 DIAGNOSIS — D1803 Hemangioma of intra-abdominal structures: Secondary | ICD-10-CM | POA: Diagnosis not present

## 2020-09-28 DIAGNOSIS — R1011 Right upper quadrant pain: Secondary | ICD-10-CM | POA: Diagnosis not present

## 2020-09-28 DIAGNOSIS — Z833 Family history of diabetes mellitus: Secondary | ICD-10-CM | POA: Diagnosis not present

## 2020-09-28 DIAGNOSIS — Z6829 Body mass index (BMI) 29.0-29.9, adult: Secondary | ICD-10-CM

## 2020-09-28 DIAGNOSIS — R933 Abnormal findings on diagnostic imaging of other parts of digestive tract: Secondary | ICD-10-CM | POA: Diagnosis not present

## 2020-09-28 DIAGNOSIS — Z880 Allergy status to penicillin: Secondary | ICD-10-CM

## 2020-09-28 DIAGNOSIS — K760 Fatty (change of) liver, not elsewhere classified: Secondary | ICD-10-CM | POA: Diagnosis present

## 2020-09-28 DIAGNOSIS — K2980 Duodenitis without bleeding: Secondary | ICD-10-CM | POA: Diagnosis present

## 2020-09-28 DIAGNOSIS — R1012 Left upper quadrant pain: Secondary | ICD-10-CM | POA: Diagnosis not present

## 2020-09-28 DIAGNOSIS — E785 Hyperlipidemia, unspecified: Secondary | ICD-10-CM | POA: Diagnosis present

## 2020-09-28 DIAGNOSIS — Z79899 Other long term (current) drug therapy: Secondary | ICD-10-CM | POA: Diagnosis not present

## 2020-09-28 DIAGNOSIS — Z8249 Family history of ischemic heart disease and other diseases of the circulatory system: Secondary | ICD-10-CM | POA: Diagnosis not present

## 2020-09-28 DIAGNOSIS — K7689 Other specified diseases of liver: Secondary | ICD-10-CM | POA: Diagnosis not present

## 2020-09-28 DIAGNOSIS — K529 Noninfective gastroenteritis and colitis, unspecified: Secondary | ICD-10-CM | POA: Diagnosis present

## 2020-09-28 DIAGNOSIS — R1084 Generalized abdominal pain: Secondary | ICD-10-CM | POA: Diagnosis not present

## 2020-09-28 DIAGNOSIS — E669 Obesity, unspecified: Secondary | ICD-10-CM | POA: Diagnosis present

## 2020-09-28 DIAGNOSIS — Z9109 Other allergy status, other than to drugs and biological substances: Secondary | ICD-10-CM

## 2020-09-28 HISTORY — DX: Noninfective gastroenteritis and colitis, unspecified: K52.9

## 2020-09-28 LAB — CBC
HCT: 38.4 % (ref 36.0–46.0)
Hemoglobin: 13 g/dL (ref 12.0–15.0)
MCH: 27 pg (ref 26.0–34.0)
MCHC: 33.9 g/dL (ref 30.0–36.0)
MCV: 79.7 fL — ABNORMAL LOW (ref 80.0–100.0)
Platelets: 259 10*3/uL (ref 150–400)
RBC: 4.82 MIL/uL (ref 3.87–5.11)
RDW: 14.1 % (ref 11.5–15.5)
WBC: 5.2 10*3/uL (ref 4.0–10.5)
nRBC: 0 % (ref 0.0–0.2)

## 2020-09-28 LAB — COMPREHENSIVE METABOLIC PANEL
ALT: 20 U/L (ref 0–44)
AST: 24 U/L (ref 15–41)
Albumin: 4.5 g/dL (ref 3.5–5.0)
Alkaline Phosphatase: 54 U/L (ref 38–126)
Anion gap: 12 (ref 5–15)
BUN: 12 mg/dL (ref 8–23)
CO2: 27 mmol/L (ref 22–32)
Calcium: 9.9 mg/dL (ref 8.9–10.3)
Chloride: 98 mmol/L (ref 98–111)
Creatinine, Ser: 1.02 mg/dL — ABNORMAL HIGH (ref 0.44–1.00)
GFR, Estimated: 60 mL/min — ABNORMAL LOW (ref >60)
Glucose, Bld: 94 mg/dL (ref 70–99)
Potassium: 3 mmol/L — ABNORMAL LOW (ref 3.5–5.1)
Sodium: 137 mmol/L (ref 135–145)
Total Bilirubin: 1.1 mg/dL (ref 0.3–1.2)
Total Protein: 8.1 g/dL (ref 6.5–8.1)

## 2020-09-28 LAB — URINALYSIS, ROUTINE W REFLEX MICROSCOPIC
Bilirubin Urine: NEGATIVE
Glucose, UA: NEGATIVE mg/dL
Hgb urine dipstick: NEGATIVE
Ketones, ur: NEGATIVE mg/dL
Nitrite: NEGATIVE
Protein, ur: NEGATIVE mg/dL
Specific Gravity, Urine: 1.01 (ref 1.005–1.030)
pH: 7.5 (ref 5.0–8.0)

## 2020-09-28 LAB — MAGNESIUM: Magnesium: 1.7 mg/dL (ref 1.7–2.4)

## 2020-09-28 LAB — URINALYSIS, MICROSCOPIC (REFLEX)

## 2020-09-28 LAB — LIPASE, BLOOD: Lipase: 40 U/L (ref 11–51)

## 2020-09-28 LAB — LACTIC ACID, PLASMA
Lactic Acid, Venous: 1.3 mmol/L (ref 0.5–1.9)
Lactic Acid, Venous: 1.2 mmol/L (ref 0.5–1.9)

## 2020-09-28 MED ORDER — SODIUM CHLORIDE 0.9 % IV BOLUS
500.0000 mL | Freq: Once | INTRAVENOUS | Status: AC
  Administered 2020-09-28: 500 mL via INTRAVENOUS

## 2020-09-28 MED ORDER — FENTANYL CITRATE (PF) 100 MCG/2ML IJ SOLN: 50.0000 ug | Freq: Once | INTRAMUSCULAR | Status: AC

## 2020-09-28 MED ORDER — POTASSIUM CHLORIDE 10 MEQ/100ML IV SOLN
10.0000 meq | INTRAVENOUS | Status: AC
  Administered 2020-09-28 (×4): 10 meq via INTRAVENOUS
  Filled 2020-09-28 (×4): qty 100

## 2020-09-28 MED ORDER — FENTANYL CITRATE (PF) 100 MCG/2ML IJ SOLN
50.0000 ug | Freq: Once | INTRAMUSCULAR | Status: AC
  Administered 2020-09-28: 50 ug via INTRAVENOUS
  Filled 2020-09-28: qty 2

## 2020-09-28 MED ORDER — SODIUM CHLORIDE 0.9 % IV SOLN: Freq: Once | INTRAVENOUS | Status: AC

## 2020-09-28 MED ORDER — CIPROFLOXACIN IN D5W 400 MG/200ML IV SOLN
400.0000 mg | Freq: Once | INTRAVENOUS | Status: AC
  Administered 2020-09-29: 400 mg via INTRAVENOUS
  Filled 2020-09-28: qty 200

## 2020-09-28 MED ORDER — FENTANYL CITRATE (PF) 100 MCG/2ML IJ SOLN
INTRAMUSCULAR | Status: AC
  Administered 2020-09-28: 50 ug via INTRAVENOUS
  Filled 2020-09-28: qty 2

## 2020-09-28 MED ORDER — IOHEXOL 300 MG/ML  SOLN
100.0000 mL | Freq: Once | INTRAMUSCULAR | Status: AC | PRN
  Administered 2020-09-28: 100 mL via INTRAVENOUS

## 2020-09-28 MED ORDER — METRONIDAZOLE IN NACL 5-0.79 MG/ML-% IV SOLN
500.0000 mg | Freq: Once | INTRAVENOUS | Status: AC
  Administered 2020-09-29: 500 mg via INTRAVENOUS
  Filled 2020-09-28: qty 100

## 2020-09-28 NOTE — ED Provider Notes (Signed)
[place] EMERGENCY DEPARTMENT Provider Note   CSN: [id number] Arrival date & time: 09/28/20  1457     History Chief Complaint  Patient presents with  . Abdominal Pain    Alexis Molina is a 69 y.o. female with a past medical history of DM, hiatal hernia, hypertension, hyperlipidemia, hypothyroid, obesity, elevated BMI, anxiety, who presents today for evaluation of abdominal pain.  She reports that about 10 days ago she started developing left-sided upper abdominal pain.  She was initially diagnosed with muscle spasms and treated with tenacity and and Celebrex.  She states that she has not had any relief from these.  She denies that her pain is made worse with eating or drinking.  No change in pain with bowel movements however she did have 1 episode of diarrhea yesterday.  She denies any blood in her diarrhea.  No fevers.  She reports that the pain is severe, hurts most with movement and touch.  She denies any chest pain or shortness of breath.  She is vaccinated against Covid.  He denies any known Covid exposures.  She reports that today at her PCPs office when giving a urine sample she did have some mild dysuria however denies frequency, urgency, or any other episodes of dysuria.  She was seen prior to coming here today by [facility] urgent care and was sent here due to the amount of her pain.  She does not have a known history of diverticulitis.  HPI     Past Medical History:  Diagnosis Date  . Allergic rhinitis   . Anxiety   . Body mass index 30.0-30.9, adult   . Cataract   . Cataracts, bilateral   . Chest pain   . Depression   . Diabetes mellitus without complication ([place])   . DM (diabetes mellitus) ([place])   . Fatigue   . Fatty liver   . FH: colonic polyps   . Hemangioma of intra-abdominal structures   . Hiatal hernia   . History of colonic polyps   . Hyperlipidemia   . Hypertension   . Hypothyroidism   . IBS (irritable bowel syndrome)   . Obesity, unspecified    . Osteopenia   . 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 Peripheral edema   . Peripheral edema    after knee surgery  . Raynaud's syndrome without gangrene   . Snake bite   . SOB (shortness of breath)   . Thyroid disease   . Tubular adenoma of colon   . Vitamin D deficiency   . Vitreous floaters of right eye     Patient Active Problem List   Diagnosis Date Noted  . Enteritis 09/28/2020  . 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  . Other vitreous opacities, right eye 09/11/2020  . Posterior vitreous detachment of both eyes 09/11/2020  . Vitreomacular adhesion of right eye 09/11/2020  . Shortness of breath 12/12/2019  . Excessive daytime sleepiness 12/12/2019  . Angina pectoris ([place])   . Diabetes mellitus, type 2 ([place]) 05/11/2015  . Family history of premature CAD 05/11/2015    Past Surgical History:  Procedure Laterality Date  . ABDOMINAL HYSTERECTOMY    . CARDIAC CATHETERIZATION N/A 07/30/2015   Procedure: Left Heart Cath and Coronary Angiography;  Surgeon: Jettie Booze, MD;  Location: [place] [facility];  Service: Cardiovascular;  Laterality: N/A;  . CARPAL TUNNEL RELEASE    . CHOLECYSTECTOMY    . KNEE SURGERY       OB  History   No obstetric history on file.     Family History  Problem Relation Age of Onset  . Alzheimer's disease Mother   . Diabetes Mother   . Hypertension Mother   . Ulcers Father   . Hypertension Father   . Arthritis Father   . Heart failure Father   . Lung cancer Brother     Social History   Tobacco Use  . Smoking status: Former Research scientist (life sciences)  . Smokeless tobacco: Never Used  Vaping Use  . Vaping Use: Never used  Substance Use Topics  . Alcohol use: No  . Drug use: No    Home Medications Prior to Admission medications   Medication Sig Start Date End Date Taking? Authorizing Provider  albuterol (VENTOLIN HFA) 108 (90 Base) MCG/ACT inhaler Inhale 2 puffs into the lungs every 4 (four) hours as needed for wheezing or  shortness of breath.    [provider]  Cholecalciferol (VITAMIN D) 125 MCG (5000 UT) CAPS Take by mouth.    [provider]  diphenhydrAMINE (BENADRYL) 25 MG tablet Take 25 mg by mouth every 6 (six) hours as needed.    [provider]  fexofenadine (ALLEGRA) 180 MG tablet Take 180 mg by mouth at bedtime.    [provider]  levothyroxine (SYNTHROID, LEVOTHROID) 75 MCG tablet Take 75 mcg by mouth daily before breakfast.    [provider]  lisinopril (PRINIVIL,ZESTRIL) 20 MG tablet Take 20 mg by mouth daily.    [provider]  Melatonin 5 MG TABS Take 1 tablet by mouth at bedtime.    [provider]  metoprolol tartrate (LOPRESSOR) 50 MG tablet Take 1 tablet by mouth 2 hours prior to Cardiac CT 12/21/19   Jettie Booze, MD  montelukast (SINGULAIR) 10 MG tablet Take 10 mg by mouth daily.    [provider]  Multiple Vitamin (MULTIVITAMIN WITH MINERALS) TABS tablet Take 1 tablet by mouth daily.    [provider]  Omega-3 Fatty Acids (FISH OIL) 1000 MG CAPS Take 1 capsule by mouth daily.    [provider]  omeprazole (PRILOSEC) 40 MG capsule Take 40 mg by mouth daily.    [provider]  polycarbophil (FIBERCON) 625 MG tablet Take 625 mg by mouth daily.    [provider]  rosuvastatin (CRESTOR) 20 MG tablet Take 20 mg by mouth daily.    [provider]  triamterene-hydrochlorothiazide (MAXZIDE) 75-50 MG per tablet Take 1 tablet by mouth daily.    [provider]    Allergies    Darvocet [propoxyphene n-acetaminophen], Orange fruit [citrus], Vicodin [hydrocodone-acetaminophen], Dust mite extract, Morphine, Glimepiride, Penicillins, and Sulfa antibiotics  Review of Systems   Review of Systems  Constitutional: Positive for chills (Only since being in the ER). Negative for fever.  HENT: Negative for congestion.   Eyes: Negative for visual disturbance.   Respiratory: Negative for chest tightness and shortness of breath.   Cardiovascular: Negative for chest pain.  Gastrointestinal: Positive for abdominal pain and diarrhea. Negative for blood in stool, nausea and vomiting.  Genitourinary: Negative for flank pain.  Musculoskeletal: Positive for back pain. Negative for neck pain.  Skin: Negative for color change, rash and wound.  Neurological: Negative for weakness and headaches.  Psychiatric/Behavioral: Negative for confusion.  All other systems reviewed and are negative.   Physical Exam Updated Vital Signs BP (!) 130/55 (BP Location: Left Arm)   Pulse (!) 56   Temp 98.2 F (36.8 C) (Oral)  Resp 14   SpO2 99%   Physical Exam Vitals and nursing note reviewed.  Constitutional:      General: She is not in acute distress.    Appearance: She is well-developed.     Comments: Patient has significant pain with movement, when attempting to set her up to evaluate for CVA tenderness to percussion she developed tears in her eyes due to pain  HENT:     Head: Normocephalic and atraumatic.  Eyes:     Conjunctiva/sclera: Conjunctivae normal.  Cardiovascular:     Rate and Rhythm: Normal rate and regular rhythm.     Heart sounds: No murmur heard.   Pulmonary:     Effort: Pulmonary effort is normal. No respiratory distress.     Breath sounds: Normal breath sounds.  Abdominal:     General: Abdomen is flat. Bowel sounds are decreased.     Palpations: Abdomen is soft.     Tenderness: There is abdominal tenderness in the left upper quadrant and left lower quadrant. There is guarding. There is no right CVA tenderness or left CVA tenderness.     Hernia: No hernia is present.  Musculoskeletal:     Cervical back: Neck supple.  Skin:    General: Skin is warm and dry.  Neurological:     Mental Status: She is alert.     ED Results / Procedures / Treatments   Labs (all labs ordered are listed, but only abnormal results are displayed) Labs  Reviewed  COMPREHENSIVE METABOLIC PANEL - Abnormal; Notable for the following components:      Result Value   Potassium 3.0 (*)    Creatinine, Ser 1.02 (*)    GFR, Estimated 60 (*)    All other components within normal limits  CBC - Abnormal; Notable for the following components:   MCV 79.7 (*)    All other components within normal limits  URINALYSIS, ROUTINE W REFLEX MICROSCOPIC - Abnormal; Notable for the following components:   Leukocytes,Ua SMALL (*)    All other components within normal limits  URINALYSIS, MICROSCOPIC (REFLEX) - Abnormal; Notable for the following components:   Bacteria, UA FEW (*)    All other components within normal limits  URINE CULTURE  RESPIRATORY PANEL BY RT PCR (FLU A&B, COVID)  LIPASE, BLOOD  MAGNESIUM  LACTIC ACID, PLASMA  LACTIC ACID, PLASMA    EKG EKG Interpretation  Date/Time:  Friday September 28 2020 17:37:47 EDT Ventricular Rate:  66 PR Interval:    QRS Duration: 96 QT Interval:  426 QTC Calculation: 454 R Axis:   72 Text Interpretation: Sinus rhythm Multiple ventricular premature complexes Borderline repolarization abnormality ST elevation, consider inferior injury Baseline wander in lead(s) III aVF When compared to prior, new PVC. No STEMI Confirmed by Antony Blackbird [phone]) on 09/28/2020 6:46:24 PM   Radiology CT ABDOMEN PELVIS W CONTRAST  Result Date: 09/28/2020 CLINICAL DATA:  Acute abdominal pain, LEFT-sided abdominal and flank pain, no improvement. EXAM: CT ABDOMEN AND PELVIS WITH CONTRAST TECHNIQUE: Multidetector CT imaging of the abdomen and pelvis was performed using the standard protocol following bolus administration of intravenous contrast. CONTRAST:  145mL OMNIPAQUE IOHEXOL 300 MG/ML  SOLN COMPARISON:  November 01, 2018 FINDINGS: Lower chest: No consolidation.  No pleural effusion. Hepatobiliary: Hepatic hemangioma in the RIGHT hepatic lobe measures 4.3 x 3.3 cm, enlarged from November of 2019 where it measured approximately  3.1 x 2.8 cm. Hepatic hemangioma in the caudate and other small hemangiomata and cysts are stable. Portal vein is  patent. Hepatic veins are patent. Post cholecystectomy with mild cholecystectomy biliary duct distension that is not changed. Pancreas: No pancreatic ductal dilation, inflammation or suspicious lesion. Stable intrapancreatic lipoma. Spleen: Spleen normal in size and contour. Adrenals/Urinary Tract: Adrenal glands are normal. Symmetric renal enhancement. Low-density lesion in the upper pole of the RIGHT kidney compatible with small cyst just at 1 cm. Symmetric renal enhancement. No hydronephrosis or suspicious renal lesion. Urinary bladder is normal. Stomach/Bowel: Marked thickening of a segment of jejunum with perienteric stranding. No signs of pneumatosis. Question poor enhancement of the medial wall of the jejunum (image 29 of series 2) with marked mural stratification involving approximately 5 cm of the jejunum. Mild mesenteric stranding elsewhere in the jejunal mesentery. No ileal thickening. The appendix is normal. The remainder of the small bowel enhances normally. Colon is unremarkable. Vascular/Lymphatic: Calcified and noncalcified atheromatous plaque in the abdominal aorta. SMA is patent. SMV is patent. There is no gastrohepatic or hepatoduodenal ligament lymphadenopathy. No retroperitoneal or mesenteric lymphadenopathy. No pelvic adenopathy Reproductive: Post hysterectomy.  No adnexal mass. Other: No ascites.  No free air. Musculoskeletal: Sacral nerve stimulator, power pack over LEFT hip. No acute musculoskeletal findings. IMPRESSION: 1. Marked thickening of a segment of jejunum with perienteric stranding. Question poor enhancement of the medial wall of the jejunum (image 29 of series 2) marked mural stratification involving approximately 5 cm of the jejunum. Findings may represent infectious or inflammatory enteritis. Given segmental nature and severity ischemia is also considered. Some  hypoenhancement is seen along the medial wall though this could be related to edema. Consider correlation with lactate and repeat imaging if symptoms should worsen. 2. Some mild mesenteric elsewhere within the jejunal mesentery though to a lesser extent and without the mural stratification of bowel wall thickening seen in the segment described above. 3. Hepatic hemangiomas and cysts. 4. Aortic atherosclerosis. These results were called by telephone at the time of interpretation on 09/28/2020 at 6:56 pm to provider [facility] , who verbally acknowledged these results. Aortic Atherosclerosis (ICD10-I70.0). Electronically Signed   By: Zetta Bills M.D.   On: 09/28/2020 18:57    Procedures Procedures (including critical care time)  Medications Ordered in ED Medications  ciprofloxacin (CIPRO) IVPB 400 mg (400 mg Intravenous New Bag/Given 09/29/20 0009)  metroNIDAZOLE (FLAGYL) IVPB 500 mg (has no administration in time range)  fentaNYL (SUBLIMAZE) injection 50 mcg (50 mcg Intravenous Given 09/28/20 1804)  sodium chloride 0.9 % bolus 500 mL (0 mLs Intravenous Stopped 09/28/20 1905)  iohexol (OMNIPAQUE) 300 MG/ML solution 100 mL (100 mLs Intravenous Contrast Given 09/28/20 1810)  potassium chloride 10 mEq in 100 mL IVPB (0 mEq Intravenous Stopped 09/29/20 0009)  0.9 %  sodium chloride infusion (0 mLs Intravenous Stopped 09/29/20 0008)  fentaNYL (SUBLIMAZE) injection 50 mcg (50 mcg Intravenous Given 09/28/20 2207)    ED Course  I have reviewed the triage vital signs and the nursing notes.  Pertinent labs & imaging results that were available during my care of the patient were reviewed by me and considered in my medical decision making (see chart for details).  Clinical Course as of Sep 30 11  Fri Sep 28, 2020  4481 Lactic Acid, Venous: 1.3 [EH]    Clinical Course User Index [EH] Ollen Gross   MDM Rules/Calculators/A&P                         Patient is a 69 year old  woman who presents today for  evaluation of worsening abdominal pain.  On exam she appears genuinely uncomfortable, with position changes develops tears in her eyes due to pain.  On my evaluation she does not meet Sirs/sepsis criteria.  She does not have significant leukocytosis, is afebrile not tachycardic or tachypneic and at 100% on room air.  CBC is unremarkable.  CMP shows mild hypokalemia with a potassium of 3.0.  Magnesium is normal.  IV replacement ordered.  Her pain is treated with fentanyl and she is given IV fluids.  UA shows few bacteria with small leukocytes, no clear-cut evidence of UTI at this time, culture is sent.  CT abdomen pelvis shows marked thickening of a segment of the jejunum with stranding and edema.   Per radiologist although scan is limited by lack of arterial phase contrast to the SMA and other abdominal arteries appear wide open.  Given Lactic acid is not elevated no clear evidence of ischemia, however that could be underlying cause.  Given patients degree of pain, along with abnormal CT scan and age patient will require medical admission.  No indication for CTA at this time, however if her condition changes that may change.  No clear for surgical abdomen at this time.  She will be started on Cipro Flagyl for enteritis.  Spoke with Dr. Myna Hidalgo who will admit patient.  Note: Portions of this report may have been transcribed using voice recognition software. Every effort was made to ensure accuracy; however, inadvertent computerized transcription errors may be present  Final Clinical Impression(s) / ED Diagnoses Final diagnoses:  Generalized abdominal pain  Enteritis    Rx / DC Orders ED Discharge Orders    None       Lorin Glass, PA-C 09/29/20 0012    Tegeler, Gwenyth Allegra, MD 09/29/20 [phone]

## 2020-09-28 NOTE — ED Notes (Signed)
Patient transported to CT 

## 2020-09-28 NOTE — ED Notes (Signed)
Assumed care of patient from Andrea, RN.

## 2020-09-28 NOTE — ED Triage Notes (Addendum)
Pt c/o left side abd pain/flank pain x 1 week-seen by PCP 3 days ago-dx with muscle spasms-no improvement with rx meds-was seen again today and sent to ED-to triage in w/c-grimacing when pain increases

## 2020-09-28 NOTE — ED Notes (Signed)
ED Provider at bedside. 

## 2020-09-29 ENCOUNTER — Encounter (HOSPITAL_COMMUNITY): Payer: Self-pay | Admitting: Family Medicine

## 2020-09-29 DIAGNOSIS — K2950 Unspecified chronic gastritis without bleeding: Secondary | ICD-10-CM | POA: Diagnosis not present

## 2020-09-29 DIAGNOSIS — Z8249 Family history of ischemic heart disease and other diseases of the circulatory system: Secondary | ICD-10-CM | POA: Diagnosis not present

## 2020-09-29 DIAGNOSIS — Z6829 Body mass index (BMI) 29.0-29.9, adult: Secondary | ICD-10-CM | POA: Diagnosis not present

## 2020-09-29 DIAGNOSIS — E1122 Type 2 diabetes mellitus with diabetic chronic kidney disease: Secondary | ICD-10-CM | POA: Diagnosis present

## 2020-09-29 DIAGNOSIS — Z9049 Acquired absence of other specified parts of digestive tract: Secondary | ICD-10-CM | POA: Diagnosis not present

## 2020-09-29 DIAGNOSIS — Z79899 Other long term (current) drug therapy: Secondary | ICD-10-CM | POA: Diagnosis not present

## 2020-09-29 DIAGNOSIS — E039 Hypothyroidism, unspecified: Secondary | ICD-10-CM | POA: Diagnosis present

## 2020-09-29 DIAGNOSIS — D1803 Hemangioma of intra-abdominal structures: Secondary | ICD-10-CM | POA: Diagnosis not present

## 2020-09-29 DIAGNOSIS — E890 Postprocedural hypothyroidism: Secondary | ICD-10-CM | POA: Diagnosis present

## 2020-09-29 DIAGNOSIS — R109 Unspecified abdominal pain: Secondary | ICD-10-CM

## 2020-09-29 DIAGNOSIS — J309 Allergic rhinitis, unspecified: Secondary | ICD-10-CM | POA: Diagnosis present

## 2020-09-29 DIAGNOSIS — H43813 Vitreous degeneration, bilateral: Secondary | ICD-10-CM | POA: Diagnosis present

## 2020-09-29 DIAGNOSIS — E785 Hyperlipidemia, unspecified: Secondary | ICD-10-CM | POA: Diagnosis present

## 2020-09-29 DIAGNOSIS — R634 Abnormal weight loss: Secondary | ICD-10-CM | POA: Diagnosis not present

## 2020-09-29 DIAGNOSIS — E876 Hypokalemia: Secondary | ICD-10-CM | POA: Diagnosis present

## 2020-09-29 DIAGNOSIS — I10 Essential (primary) hypertension: Secondary | ICD-10-CM | POA: Diagnosis not present

## 2020-09-29 DIAGNOSIS — K529 Noninfective gastroenteritis and colitis, unspecified: Secondary | ICD-10-CM

## 2020-09-29 DIAGNOSIS — Z833 Family history of diabetes mellitus: Secondary | ICD-10-CM | POA: Diagnosis not present

## 2020-09-29 DIAGNOSIS — K2980 Duodenitis without bleeding: Secondary | ICD-10-CM | POA: Diagnosis present

## 2020-09-29 DIAGNOSIS — R1012 Left upper quadrant pain: Secondary | ICD-10-CM | POA: Diagnosis not present

## 2020-09-29 DIAGNOSIS — R1084 Generalized abdominal pain: Secondary | ICD-10-CM | POA: Diagnosis not present

## 2020-09-29 DIAGNOSIS — K2900 Acute gastritis without bleeding: Secondary | ICD-10-CM | POA: Diagnosis not present

## 2020-09-29 DIAGNOSIS — K3189 Other diseases of stomach and duodenum: Secondary | ICD-10-CM | POA: Diagnosis not present

## 2020-09-29 DIAGNOSIS — F419 Anxiety disorder, unspecified: Secondary | ICD-10-CM | POA: Diagnosis present

## 2020-09-29 DIAGNOSIS — Z90710 Acquired absence of both cervix and uterus: Secondary | ICD-10-CM | POA: Diagnosis not present

## 2020-09-29 DIAGNOSIS — F32A Depression, unspecified: Secondary | ICD-10-CM | POA: Diagnosis present

## 2020-09-29 DIAGNOSIS — Z20822 Contact with and (suspected) exposure to covid-19: Secondary | ICD-10-CM | POA: Diagnosis present

## 2020-09-29 DIAGNOSIS — Z87891 Personal history of nicotine dependence: Secondary | ICD-10-CM | POA: Diagnosis not present

## 2020-09-29 DIAGNOSIS — K760 Fatty (change of) liver, not elsewhere classified: Secondary | ICD-10-CM | POA: Diagnosis present

## 2020-09-29 DIAGNOSIS — N182 Chronic kidney disease, stage 2 (mild): Secondary | ICD-10-CM | POA: Diagnosis present

## 2020-09-29 DIAGNOSIS — R1011 Right upper quadrant pain: Secondary | ICD-10-CM | POA: Diagnosis not present

## 2020-09-29 DIAGNOSIS — I129 Hypertensive chronic kidney disease with stage 1 through stage 4 chronic kidney disease, or unspecified chronic kidney disease: Secondary | ICD-10-CM | POA: Diagnosis present

## 2020-09-29 DIAGNOSIS — R933 Abnormal findings on diagnostic imaging of other parts of digestive tract: Secondary | ICD-10-CM | POA: Diagnosis not present

## 2020-09-29 DIAGNOSIS — E669 Obesity, unspecified: Secondary | ICD-10-CM | POA: Diagnosis present

## 2020-09-29 DIAGNOSIS — Z79890 Hormone replacement therapy (postmenopausal): Secondary | ICD-10-CM | POA: Diagnosis not present

## 2020-09-29 HISTORY — DX: Unspecified abdominal pain: R10.9

## 2020-09-29 HISTORY — DX: Generalized abdominal pain: R10.84

## 2020-09-29 LAB — CBC WITH DIFFERENTIAL/PLATELET
Abs Immature Granulocytes: 0.01 10*3/uL (ref 0.00–0.07)
Basophils Absolute: 0 10*3/uL (ref 0.0–0.1)
Basophils Relative: 0 %
Eosinophils Absolute: 0.1 10*3/uL (ref 0.0–0.5)
Eosinophils Relative: 1 %
HCT: 35 % — ABNORMAL LOW (ref 36.0–46.0)
Hemoglobin: 11.7 g/dL — ABNORMAL LOW (ref 12.0–15.0)
Immature Granulocytes: 0 %
Lymphocytes Relative: 24 %
Lymphs Abs: 1.2 10*3/uL (ref 0.7–4.0)
MCH: 27 pg (ref 26.0–34.0)
MCHC: 33.4 g/dL (ref 30.0–36.0)
MCV: 80.6 fL (ref 80.0–100.0)
Monocytes Absolute: 0.5 10*3/uL (ref 0.1–1.0)
Monocytes Relative: 10 %
Neutro Abs: 3.2 10*3/uL (ref 1.7–7.7)
Neutrophils Relative %: 65 %
Platelets: 209 10*3/uL (ref 150–400)
RBC: 4.34 MIL/uL (ref 3.87–5.11)
RDW: 13.8 % (ref 11.5–15.5)
WBC: 5 10*3/uL (ref 4.0–10.5)
nRBC: 0 % (ref 0.0–0.2)

## 2020-09-29 LAB — BASIC METABOLIC PANEL
Anion gap: 9 (ref 5–15)
BUN: 10 mg/dL (ref 8–23)
CO2: 26 mmol/L (ref 22–32)
Calcium: 9.2 mg/dL (ref 8.9–10.3)
Chloride: 105 mmol/L (ref 98–111)
Creatinine, Ser: 1.07 mg/dL — ABNORMAL HIGH (ref 0.44–1.00)
GFR, Estimated: 57 mL/min — ABNORMAL LOW (ref >60)
Glucose, Bld: 94 mg/dL (ref 70–99)
Potassium: 4.1 mmol/L (ref 3.5–5.1)
Sodium: 140 mmol/L (ref 135–145)

## 2020-09-29 LAB — GLUCOSE, CAPILLARY
Glucose-Capillary: 72 mg/dL (ref 70–99)
Glucose-Capillary: 106 mg/dL — ABNORMAL HIGH (ref 70–99)
Glucose-Capillary: 166 mg/dL — ABNORMAL HIGH (ref 70–99)

## 2020-09-29 LAB — HEPATIC FUNCTION PANEL
ALT: 17 U/L (ref 0–44)
AST: 19 U/L (ref 15–41)
Albumin: 3.8 g/dL (ref 3.5–5.0)
Alkaline Phosphatase: 49 U/L (ref 38–126)
Bilirubin, Direct: 0.2 mg/dL (ref 0.0–0.2)
Indirect Bilirubin: 1 mg/dL — ABNORMAL HIGH (ref 0.3–0.9)
Total Bilirubin: 1.2 mg/dL (ref 0.3–1.2)
Total Protein: 6.8 g/dL (ref 6.5–8.1)

## 2020-09-29 LAB — LACTIC ACID, PLASMA
Lactic Acid, Venous: 2.5 mmol/L (ref 0.5–1.9)
Lactic Acid, Venous: 0.8 mmol/L (ref 0.5–1.9)

## 2020-09-29 LAB — LIPASE, BLOOD: Lipase: 32 U/L (ref 11–51)

## 2020-09-29 LAB — TROPONIN I (HIGH SENSITIVITY): Troponin I (High Sensitivity): 6 ng/L (ref <18)

## 2020-09-29 LAB — RESPIRATORY PANEL BY RT PCR (FLU A&B, COVID)
Influenza A by PCR: NEGATIVE
Influenza B by PCR: NEGATIVE
SARS Coronavirus 2 by RT PCR: NEGATIVE

## 2020-09-29 LAB — HIV ANTIBODY (ROUTINE TESTING W REFLEX): HIV Screen 4th Generation wRfx: NONREACTIVE

## 2020-09-29 MED ORDER — HYDROMORPHONE HCL 1 MG/ML IJ SOLN: 0.5000 mg | INTRAMUSCULAR | Status: DC | PRN

## 2020-09-29 MED ORDER — MORPHINE SULFATE (PF) 2 MG/ML IV SOLN
1.0000 mg | INTRAVENOUS | Status: DC | PRN
  Administered 2020-09-30 – 2020-10-01 (×3): 2 mg via INTRAVENOUS
  Filled 2020-09-29 (×3): qty 1

## 2020-09-29 MED ORDER — ENSURE ENLIVE PO LIQD
237.0000 mL | Freq: Two times a day (BID) | ORAL | Status: DC
  Administered 2020-10-03 (×2): 237 mL via ORAL

## 2020-09-29 MED ORDER — CIPROFLOXACIN IN D5W 400 MG/200ML IV SOLN
400.0000 mg | Freq: Two times a day (BID) | INTRAVENOUS | Status: DC
  Administered 2020-09-29 – 2020-10-03 (×9): 400 mg via INTRAVENOUS
  Filled 2020-09-29 (×9): qty 200

## 2020-09-29 MED ORDER — HYDROMORPHONE HCL 1 MG/ML IJ SOLN
0.5000 mg | INTRAMUSCULAR | Status: DC | PRN
  Administered 2020-09-29: 13:00:00 1 mg via INTRAVENOUS
  Filled 2020-09-29: qty 1

## 2020-09-29 MED ORDER — HEPARIN SODIUM (PORCINE) 5000 UNIT/ML IJ SOLN
5000.0000 [IU] | Freq: Three times a day (TID) | INTRAMUSCULAR | Status: DC
  Administered 2020-09-29 – 2020-10-03 (×13): 5000 [IU] via SUBCUTANEOUS
  Filled 2020-09-29 (×13): qty 1

## 2020-09-29 MED ORDER — DEXTROSE-NACL 5-0.9 % IV SOLN: INTRAVENOUS | Status: DC

## 2020-09-29 MED ORDER — METRONIDAZOLE IN NACL 5-0.79 MG/ML-% IV SOLN
500.0000 mg | Freq: Three times a day (TID) | INTRAVENOUS | Status: DC
  Administered 2020-09-29 – 2020-10-03 (×11): 500 mg via INTRAVENOUS
  Filled 2020-09-29 (×11): qty 100

## 2020-09-29 MED ORDER — SODIUM CHLORIDE 0.9 % IV SOLN: INTRAVENOUS | Status: DC

## 2020-09-29 MED ORDER — ACETAMINOPHEN 650 MG RE SUPP: 650.0000 mg | Freq: Four times a day (QID) | RECTAL | Status: DC | PRN

## 2020-09-29 MED ORDER — ONDANSETRON HCL 4 MG/2ML IJ SOLN
4.0000 mg | Freq: Four times a day (QID) | INTRAMUSCULAR | Status: DC | PRN
  Administered 2020-09-29: 14:00:00 4 mg via INTRAVENOUS
  Filled 2020-09-29: qty 2

## 2020-09-29 MED ORDER — SODIUM CHLORIDE 0.9 % IV BOLUS
1000.0000 mL | Freq: Once | INTRAVENOUS | Status: AC
  Administered 2020-09-29: 07:00:00 1000 mL via INTRAVENOUS

## 2020-09-29 MED ORDER — PROMETHAZINE HCL 25 MG/ML IJ SOLN
12.5000 mg | Freq: Four times a day (QID) | INTRAMUSCULAR | Status: DC | PRN
  Administered 2020-09-29 – 2020-10-01 (×2): 12.5 mg via INTRAVENOUS
  Filled 2020-09-29 (×2): qty 1

## 2020-09-29 MED ORDER — MORPHINE SULFATE (PF) 2 MG/ML IV SOLN: 2.0000 mg | INTRAVENOUS | Status: DC | PRN

## 2020-09-29 MED ORDER — MORPHINE SULFATE (PF) 2 MG/ML IV SOLN
1.0000 mg | INTRAVENOUS | Status: DC | PRN
  Administered 2020-09-29: 1 mg via INTRAVENOUS
  Filled 2020-09-29: qty 1

## 2020-09-29 MED ORDER — ACETAMINOPHEN 325 MG PO TABS
650.0000 mg | ORAL_TABLET | Freq: Four times a day (QID) | ORAL | Status: DC | PRN
  Administered 2020-09-29 – 2020-10-02 (×3): 650 mg via ORAL
  Filled 2020-09-29 (×3): qty 2

## 2020-09-29 MED ORDER — POTASSIUM CHLORIDE 2 MEQ/ML IV SOLN
INTRAVENOUS | Status: DC
  Filled 2020-09-29 (×2): qty 1000

## 2020-09-29 MED ORDER — LEVOTHYROXINE SODIUM 100 MCG/5ML IV SOLN: 37.5000 ug | Freq: Every day | INTRAVENOUS | Status: DC

## 2020-09-29 NOTE — Progress Notes (Signed)
Pharmacy Antibiotic Note  Alexis Molina is a 69 y.o. female admitted on 09/28/2020 with with history of hypertension, post ablative hypothyroidism presents to the ER at [facility] with complaints of abdominal pain  Pharmacy has been consulted for cipro dosing.  Plan: cipro 400mg  IV q12h Follow renal function and clinical course  Height: 5\' 2"  (157.5 cm) Weight: 72.6 kg (160 lb 1.6 oz) IBW/kg (Calculated) : 50.1  Temp (24hrs), Avg:98.4 F (36.9 C), Min:98.2 F (36.8 C), Max:98.5 F (36.9 C)  Recent Labs  Lab 09/28/20 1539 09/28/20 1842 09/28/20 2038  WBC 5.2  --   --   CREATININE 1.02*  --   --   LATICACIDVEN  --  1.3 1.2    Estimated Creatinine Clearance: 49.3 mL/min (A) (by C-G formula based on SCr of 1.02 mg/dL (H)).    Allergies  Allergen Reactions  . Darvocet [Propoxyphene N-Acetaminophen] Other (See Comments)    Asthma attack  . Orange Fruit [Citrus] Itching and Swelling  . Vicodin [Hydrocodone-Acetaminophen] Hives and Itching  . Dust Mite Extract Itching  . Morphine Itching  . Glimepiride Itching  . Penicillins Hives  . Sulfa Antibiotics Hives     Thank you for allowing pharmacy to be a part of this patient's care.  Dolly Rias RPh 09/29/2020, 5:27 AM

## 2020-09-29 NOTE — Progress Notes (Signed)
PROGRESS NOTE    Alexis Molina  TDV:[id number] DOB: June 04, 1951 DOA: 09/28/2020 PCP: Hulan Fess, MD    Brief Narrative:  69 y.o. female with history of hypertension, post ablative hypothyroidism presents to the ER at [facility] with complaints of abdominal pain.  Patient has benign left upper quadrant and epigastric abdominal discomfort for the last 10 days with loose stools.  Patient only has one episode of loose stools every day with no blood in it denies any fever chills vomiting is able to tolerate diet and is able to take her medications.  The abdominal pain is not related to food.  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  Has had a procedure done for bowel incontinence 2 years ago at [facility] by Dr. Amalia Hailey.  Since the pain was worsening patient came to the ER.  Denies taking any antibiotics recently.  ED Course: In the ER patient was afebrile not hypoxic and Covid test was negative.  Labs are largely unremarkable with creatinine mildly elevated at baseline.  LFTs were normal.  CT abdomen pelvis shows jejunal thickening concerning for infectious versus inflammatory possible ischemic enteritis.   Assessment & Plan:   Principal Problem:   Generalized abdominal pain Active Problems:   Enteritis   Essential hypertension   Hypothyroidism   Abdominal pain  1. Enteritis involving the jejunal area 1. Concerns of inflammatory vs infectious vs ischemic changes 2. Pt continues to have abd pain with nausea and vomiting 3. GI recommends supportive management including IVF, abx, analgesics and continued NPO status 4. GI to obtain surgical consultation to see if this is some kind of atypical mesenteric ischemia process 2. History of bowel incontinence 1. Pt is s/p implant placed at [facility] 2 years ago. 3. Hypertension 1. Pt is continued on PRN IV labetalol for  now 4. Hypothyroidism 1. Continue on IV levothyroxine while NPO 5. Chronic renal failure stage II  1. Creatinine appears to be at baseline. 2. Repeat bmet in AM 6. Mild hypokalemia 1. Resolved 2. Repeat bmet in AM.  DVT prophylaxis: Heparin subq Code Status: Full Family Communication: Pt in room, family not at bedside  Status is: Inpatient  Remains inpatient appropriate because:IV treatments appropriate due to intensity of illness or inability to take PO and Inpatient level of care appropriate due to severity of illness   Dispo: The patient is from: Home              Anticipated d/c is to: Home              Anticipated d/c date is: > 3 days              Patient currently is not medically stable to d/c.       Consultants:   GI  Procedures:     Antimicrobials: Anti-infectives (From admission, onward)   Start     Dose/Rate Route Frequency Ordered Stop   09/29/20 1200  ciprofloxacin (CIPRO) IVPB 400 mg        400 mg 200 mL/hr over 60 Minutes Intravenous Every 12 hours 09/29/20 0525     09/29/20 0900  metroNIDAZOLE (FLAGYL) IVPB 500 mg        500 mg 100 mL/hr over 60 Minutes Intravenous Every 8 hours 09/29/20 0519     09/28/20 2330  ciprofloxacin (CIPRO) IVPB 400 mg  400 mg 200 mL/hr over 60 Minutes Intravenous  Once 09/28/20 2318 09/29/20 0109   09/28/20 2330  metroNIDAZOLE (FLAGYL) IVPB 500 mg        500 mg 100 mL/hr over 60 Minutes Intravenous  Once 09/28/20 2318 09/29/20 0212       Subjective: Complaining of continued abd pain and nausea  Objective: Vitals:   09/29/20 0250 09/29/20 0309 09/29/20 1003 09/29/20 1344  BP: (!) 129/54  (!) 110/58 (!) 124/52  Pulse: 61  (!) 58 (!) 56  Resp: 17  18 18   Temp: 98.5 F (36.9 C)  98.8 F (37.1 C) 98.8 F (37.1 C)  TempSrc:   Oral Oral  SpO2: 100%  100%   Weight:  72.6 kg    Height:  5\' 2"  (1.575 m)      Intake/Output Summary (Last 24 hours) at 09/29/2020 1547 Last data filed at 09/29/2020  1400 Gross per 24 hour  Intake 1343.25 ml  Output 1100 ml  Net 243.25 ml   Filed Weights   09/29/20 0309  Weight: 72.6 kg    Examination:  General exam: Appears calm and comfortable  Respiratory system: Clear to auscultation. Respiratory effort normal. Cardiovascular system: S1 & S2 heard, Regular Gastrointestinal system: Abdomen is nondistended,generally tender Central nervous system: Alert and oriented. No focal neurological deficits. Extremities: Symmetric 5 x 5 power. Skin: No rashes, lesions  Psychiatry: Judgement and insight appear normal. Mood & affect appropriate.   Data Reviewed: I have personally reviewed following labs and imaging studies  CBC: Recent Labs  Lab 09/28/20 1539 09/29/20 0533  WBC 5.2 5.0  NEUTROABS  --  3.2  HGB 13.0 11.7*  HCT 38.4 35.0*  MCV 79.7* 80.6  PLT 259 767   Basic Metabolic Panel: Recent Labs  Lab 09/28/20 1539 09/29/20 0533  NA 137 140  K 3.0* 4.1  CL 98 105  CO2 27 26  GLUCOSE 94 94  BUN 12 10  CREATININE 1.02* 1.07*  CALCIUM 9.9 9.2  MG 1.7  --    GFR: Estimated Creatinine Clearance: 46.9 mL/min (A) (by C-G formula based on SCr of 1.07 mg/dL (H)). Liver Function Tests: Recent Labs  Lab 09/28/20 1539 09/29/20 0533  AST 24 19  ALT 20 17  ALKPHOS 54 49  BILITOT 1.1 1.2  PROT 8.1 6.8  ALBUMIN 4.5 3.8   Recent Labs  Lab 09/28/20 1539 09/29/20 0533  LIPASE 40 32   No results for input(s): AMMONIA in the last 168 hours. Coagulation Profile: No results for input(s): INR, PROTIME in the last 168 hours. Cardiac Enzymes: No results for input(s): CKTOTAL, CKMB, CKMBINDEX, TROPONINI in the last 168 hours. BNP (last 3 results) No results for input(s): PROBNP in the last 8760 hours. HbA1C: No results for input(s): HGBA1C in the last 72 hours. CBG: Recent Labs  Lab 09/29/20 1135  GLUCAP 72   Lipid Profile: No results for input(s): CHOL, HDL, LDLCALC, TRIG, CHOLHDL, LDLDIRECT in the last 72 hours. Thyroid  Function Tests: No results for input(s): TSH, T4TOTAL, FREET4, T3FREE, THYROIDAB in the last 72 hours. Anemia Panel: No results for input(s): VITAMINB12, FOLATE, FERRITIN, TIBC, IRON, RETICCTPCT in the last 72 hours. Sepsis Labs: Recent Labs  Lab 09/28/20 1842 09/28/20 2038 09/29/20 0533 09/29/20 0737  LATICACIDVEN 1.3 1.2 2.5* 0.8    Recent Results (from the past 240 hour(s))  Respiratory Panel by RT PCR (Flu A&B, Covid) - Nasopharyngeal Swab     Status: None   Collection Time: 09/29/20 12:10 AM  Specimen: Nasopharyngeal Swab  Result Value Ref Range Status   SARS Coronavirus 2 by RT PCR NEGATIVE NEGATIVE Final    Comment: (NOTE) SARS-CoV-2 target nucleic acids are NOT DETECTED.  The SARS-CoV-2 RNA is generally detectable in upper respiratoy specimens during the acute phase of infection. The lowest concentration of SARS-CoV-2 viral copies this assay can detect is 131 copies/mL. A negative result does not preclude SARS-Cov-2 infection and should not be used as the sole basis for treatment or other patient management decisions. A negative result may occur with  improper specimen collection/handling, submission of specimen other than nasopharyngeal swab, presence of viral mutation(s) within the areas targeted by this assay, and inadequate number of viral copies (<131 copies/mL). A negative result must be combined with clinical observations, patient history, and epidemiological information. The expected result is Negative.  Fact Sheet for Patients:  PinkCheek.be  Fact Sheet for Healthcare Providers:  GravelBags.it  This test is no t yet approved or cleared by the [place] FDA and  has been authorized for detection and/or diagnosis of SARS-CoV-2 by FDA under an Emergency Use Authorization (EUA). This EUA will remain  in effect (meaning this test can be used) for the duration of the COVID-19 declaration under  Section 564(b)(1) of the Act, 21 U.S.C. section 360bbb-3(b)(1), unless the authorization is terminated or revoked sooner.     Influenza A by PCR NEGATIVE NEGATIVE Final   Influenza B by PCR NEGATIVE NEGATIVE Final    Comment: (NOTE) The Xpert Xpress SARS-CoV-2/FLU/RSV assay is intended as an aid in  the diagnosis of influenza from Nasopharyngeal swab specimens and  should not be used as a sole basis for treatment. Nasal washings and  aspirates are unacceptable for Xpert Xpress SARS-CoV-2/FLU/RSV  testing.  Fact Sheet for Patients: PinkCheek.be  Fact Sheet for Healthcare Providers: GravelBags.it  This test is not yet approved or cleared by the [place] FDA and  has been authorized for detection and/or diagnosis of SARS-CoV-2 by  FDA under an Emergency Use Authorization (EUA). This EUA will remain  in effect (meaning this test can be used) for the duration of the  Covid-19 declaration under Section 564(b)(1) of the Act, 21  U.S.C. section 360bbb-3(b)(1), unless the authorization is  terminated or revoked. Performed at [facility], [address], [place], [place] [address]      Radiology Studies: CT ABDOMEN PELVIS W CONTRAST  Addendum Date: 09/29/2020   ADDENDUM REPORT: 09/29/2020 08:19 ADDENDUM: Trace amount of free fluid in the pelvis is nonspecific. Differential considerations given that the patient is on an Ace inhibitor could also include angioedema of the small bowel, a rare complication related to Ace inhibitor use. This usually occurs within days of initiating this medication but can occasionally occur after chronic use. Would correlate with any repeated history of abdominal pain without clear explanation. These results were called by telephone at the time of interpretation on 09/29/2020 at 8:18 am to provider Dr. Wyline Copas, Who verbally acknowledged these results. Electronically Signed   By: Zetta Bills  M.D.   On: 09/29/2020 08:19   Result Date: 09/29/2020 CLINICAL DATA:  Acute abdominal pain, LEFT-sided abdominal and flank pain, no improvement. EXAM: CT ABDOMEN AND PELVIS WITH CONTRAST TECHNIQUE: Multidetector CT imaging of the abdomen and pelvis was performed using the standard protocol following bolus administration of intravenous contrast. CONTRAST:  155mL OMNIPAQUE IOHEXOL 300 MG/ML  SOLN COMPARISON:  November 01, 2018 FINDINGS: Lower chest: No consolidation.  No pleural effusion. 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 Hepatic hemangioma in the caudate and other small hemangiomata and cysts are stable. Portal vein is patent. Hepatic veins are patent. Post cholecystectomy with mild cholecystectomy biliary duct distension that is not changed. Pancreas: No pancreatic ductal dilation, inflammation or suspicious lesion. Stable intrapancreatic lipoma. Spleen: Spleen normal in size and contour. Adrenals/Urinary Tract: Adrenal glands are normal. Symmetric renal enhancement. Low-density lesion in the upper pole of the RIGHT kidney compatible with small cyst just at 1 cm. Symmetric renal enhancement. No hydronephrosis or suspicious renal lesion. Urinary bladder is normal. Stomach/Bowel: Marked thickening of a segment of jejunum with perienteric stranding. No signs of pneumatosis. Question poor enhancement of the medial wall of the jejunum (image 29 of series 2) with marked mural stratification involving approximately 5 cm of the jejunum. Mild mesenteric stranding elsewhere in the jejunal mesentery. No ileal thickening. The appendix is normal. The remainder of the small bowel enhances normally. Colon is unremarkable. Vascular/Lymphatic: Calcified and noncalcified atheromatous plaque in the abdominal aorta. SMA is patent. SMV is patent. There is no gastrohepatic or hepatoduodenal ligament  lymphadenopathy. No retroperitoneal or mesenteric lymphadenopathy. No pelvic adenopathy Reproductive: Post hysterectomy.  No adnexal mass. Other: No ascites.  No free air. Musculoskeletal: Sacral nerve stimulator, power pack over LEFT hip. No acute musculoskeletal findings. IMPRESSION: 1. Marked thickening of a segment of jejunum with perienteric stranding. Question poor enhancement of the medial wall of the jejunum (image 29 of series 2) marked mural stratification involving approximately 5 cm of the jejunum. Findings may represent infectious or inflammatory enteritis. Given segmental nature and severity ischemia is also considered. Some hypoenhancement is seen along the medial wall though this could be related to edema. Consider correlation with lactate and repeat imaging if symptoms should worsen. 2. Some mild mesenteric elsewhere within the jejunal mesentery though to a lesser extent and without the mural stratification of bowel wall thickening seen in the segment described above. 3. Hepatic hemangiomas and cysts. 4. Aortic atherosclerosis. These results were called by telephone at the time of interpretation on 09/28/2020 at 6:56 pm to provider [facility] , who verbally acknowledged these results. Aortic Atherosclerosis (ICD10-I70.0). Electronically Signed: By: Zetta Bills M.D. On: 09/28/2020 18:57    Scheduled Meds: . feeding supplement  237 mL Oral BID BM  . heparin  5,000 Units Subcutaneous Q8H   Continuous Infusions: . ciprofloxacin 400 mg (09/29/20 1133)  . dextrose 5 % and 0.9% NaCl 100 mL/hr at 09/29/20 1209  . metronidazole 500 mg (09/29/20 0830)     LOS: 0 days   Marylu Lund, MD [facility] Pager On Amion  If 7PM-7AM, please contact night-coverage 09/29/2020, 3:47 PM

## 2020-09-29 NOTE — Progress Notes (Signed)
Patient arrived via [facility]. MD notified.

## 2020-09-29 NOTE — ED Notes (Signed)
Pt departs ED with [facility] at this time.  

## 2020-09-29 NOTE — Progress Notes (Signed)
Critical lab value: Lactic Acid 2.5. MD notified.

## 2020-09-29 NOTE — Consult Note (Signed)
[facility] Consultation Note  Referring Provider: [facility] Primary Care Physician:  Hulan Fess, MD  Reason for Consultation:  Abdominal pain, jejunal thickening  HPI: Alexis Molina is a 69 y.o. female with 10-day history of progressively worsening abdominal pain.  Pain left-sided typically, but has evolved to also involve right hemi-abdomen.  Associated symptoms include vomiting.  Chart reports she's had diarrhea, but patient denies diarrhea upon my questioning.  No blood in stool.  She feels like she has had some weight loss.  Prior history of abdominal pain, completely different than this current pain, with work-up at [facility] including endoscopy which was reportedly negative.  CT scan upon assessment showed pronounced jejunal wall thickening.   Past Medical History:  Diagnosis Date  . Allergic rhinitis   . Anxiety   . Body mass index 30.0-30.9, adult   . Cataract   . Cataracts, bilateral   . Chest pain   . Depression   . Diabetes mellitus without complication ([place])   . DM (diabetes mellitus) ([place])   . Fatigue   . Fatty liver   . FH: colonic polyps   . Hemangioma of intra-abdominal structures   . Hiatal hernia   . History of colonic polyps   . Hyperlipidemia   . Hypertension   . Hypothyroidism   . IBS (irritable bowel syndrome)   . Obesity, unspecified   . Osteopenia   . 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 Peripheral edema   . Peripheral edema    after knee surgery  . Raynaud's syndrome without gangrene   . Snake bite   . SOB (shortness of breath)   . Thyroid disease   . Tubular adenoma of colon   . Vitamin D deficiency   . Vitreous floaters of right eye     Past Surgical History:  Procedure Laterality Date  . ABDOMINAL HYSTERECTOMY    . CARDIAC CATHETERIZATION N/A 07/30/2015   Procedure: Left Heart Cath and Coronary Angiography;  Surgeon: Jettie Booze, MD;  Location: [facility];  Service:  Cardiovascular;  Laterality: N/A;  . CARPAL TUNNEL RELEASE    . CHOLECYSTECTOMY    . KNEE SURGERY      Prior to Admission medications   Medication Sig Start Date End Date Taking? Authorizing Provider  albuterol (VENTOLIN HFA) 108 (90 Base) MCG/ACT inhaler Inhale 2 puffs into the lungs every 4 (four) hours as needed for wheezing or shortness of breath.   Yes [provider]  Cholecalciferol (VITAMIN D) 125 MCG (5000 UT) CAPS Take 5,000 Units by mouth daily.    Yes [provider]  diphenhydrAMINE (BENADRYL) 25 MG tablet Take 25 mg by mouth every 6 (six) hours as needed. Itching   Yes [provider]  fexofenadine (ALLEGRA) 180 MG tablet Take 180 mg by mouth daily.    Yes [provider]  levothyroxine (SYNTHROID) 50 MCG tablet Take 50 mcg by mouth daily. 07/09/20  Yes [provider]  lisinopril (PRINIVIL,ZESTRIL) 20 MG tablet Take 20 mg by mouth daily.   Yes [provider]  metoprolol tartrate (LOPRESSOR) 50 MG tablet Take 1 tablet by mouth 2 hours prior to Cardiac CT 12/21/19  Yes Jettie Booze, MD  montelukast (SINGULAIR) 10 MG tablet Take 10 mg by mouth daily.   Yes [provider]  Multiple Vitamin (MULTIVITAMIN WITH MINERALS) TABS tablet Take 1 tablet by mouth daily.   Yes [provider]  omeprazole (PRILOSEC) 40 MG capsule Take  40 mg by mouth daily.   Yes [provider]  rosuvastatin (CRESTOR) 20 MG tablet Take 20 mg by mouth daily.   Yes [provider]  tiZANidine (ZANAFLEX) 2 MG tablet Take 1 tablet by mouth daily as needed for muscle spasms.  09/25/20  Yes [provider]  triamterene-hydrochlorothiazide (MAXZIDE) 75-50 MG per tablet Take 1 tablet by mouth daily.   Yes [provider]  levothyroxine (SYNTHROID, LEVOTHROID) 75 MCG tablet Take 75 mcg by mouth daily before breakfast. Patient not taking: Reported on 09/29/2020    [provider]  Melatonin 5 MG TABS  Take 1 tablet by mouth at bedtime. Patient not taking: Reported on 09/29/2020    [provider]  Omega-3 Fatty Acids (FISH OIL) 1000 MG CAPS Take 1 capsule by mouth daily. Patient not taking: Reported on 09/29/2020    [provider]  polycarbophil (FIBERCON) 625 MG tablet Take 625 mg by mouth daily. Patient not taking: Reported on 09/29/2020    [provider]    Current Facility-Administered Medications  Medication Dose Route Frequency Provider Last Rate Last Admin  . acetaminophen (TYLENOL) tablet 650 mg  650 mg Oral Q6H PRN Rise Patience, MD   650 mg at 09/29/20 1028   Or  . acetaminophen (TYLENOL) suppository 650 mg  650 mg Rectal Q6H PRN Rise Patience, MD      . ciprofloxacin (CIPRO) IVPB 400 mg  400 mg Intravenous Q12H Angela Adam, RPH 200 mL/hr at 09/29/20 1133 400 mg at 09/29/20 1133  . dextrose 5 %-0.9 % sodium chloride infusion   Intravenous Continuous Donne Hazel, MD 100 mL/hr at 09/29/20 1209 New Bag at 09/29/20 1209  . feeding supplement (ENSURE ENLIVE / ENSURE PLUS) liquid 237 mL  237 mL Oral BID BM Rise Patience, MD      . heparin injection 5,000 Units  5,000 Units Subcutaneous Q8H Rise Patience, MD   5,000 Units at 09/29/20 0543  . metroNIDAZOLE (FLAGYL) IVPB 500 mg  500 mg Intravenous Q8H Rise Patience, MD 100 mL/hr at 09/29/20 0830 500 mg at 09/29/20 0830  . morphine 2 MG/ML injection 1-2 mg  1-2 mg Intravenous Q3H PRN Donne Hazel, MD      . ondansetron [facility]) injection 4 mg  4 mg Intravenous Q6H PRN Donne Hazel, MD        Allergies as of 09/28/2020 - Review Complete 09/28/2020  Allergen Reaction Noted  . Darvocet [propoxyphene n-acetaminophen] Other (See Comments) 06/23/2013  . Orange fruit [citrus] Itching and Swelling 06/23/2013  . Vicodin [hydrocodone-acetaminophen] Hives and Itching 05/11/2015  . Dust mite extract Itching 05/09/2015  . Morphine Itching 02/23/2019  . Glimepiride  Itching 05/09/2015  . Penicillins Hives 06/23/2013  . Sulfa antibiotics Hives 06/23/2013    Family History  Problem Relation Age of Onset  . Alzheimer's disease Mother   . Diabetes Mother   . Hypertension Mother   . Ulcers Father   . Hypertension Father   . Arthritis Father   . Heart failure Father   . Lung cancer Brother     Social History   Socioeconomic History  . Marital status: Married    Spouse name: Not on file  . Number of children: Not on file  . Years of education: Not on file  . Highest education level: Not on file  Occupational History  . Not on file  Tobacco Use  . Smoking status: Former Research scientist (life sciences)  . Smokeless tobacco:  Never Used  Vaping Use  . Vaping Use: Never used  Substance and Sexual Activity  . Alcohol use: No  . Drug use: No  . Sexual activity: Not on file  Other Topics Concern  . Not on file  Social History Narrative  . Not on file   Social Determinants of Health   Financial Resource Strain:   . Difficulty of Paying Living Expenses: Not on file  Food Insecurity:   . Worried About Charity fundraiser in the Last Year: Not on file  . Ran Out of Food in the Last Year: Not on file  Transportation Needs:   . Lack of Transportation (Medical): Not on file  . Lack of Transportation (Non-Medical): Not on file  Physical Activity:   . Days of Exercise per Week: Not on file  . Minutes of Exercise per Session: Not on file  Stress:   . Feeling of Stress : Not on file  Social Connections:   . Frequency of Communication with Friends and Family: Not on file  . Frequency of Social Gatherings with Friends and Family: Not on file  . Attends Religious Services: Not on file  . Active Member of Clubs or Organizations: Not on file  . Attends Archivist Meetings: Not on file  . Marital Status: Not on file  Intimate Partner Violence:   . Fear of Current or Ex-Partner: Not on file  . Emotionally Abused: Not on file  . Physically Abused: Not on file   . Sexually Abused: Not on file    Review of Systems: As per HPI, all others negative  Physical Exam: Vital signs in last 24 hours: Temp:  [98.2 F (36.8 C)-98.8 F (37.1 C)] 98.8 F (37.1 C) (10/23 1344) Pulse Rate:  [55-68] 56 (10/23 1344) Resp:  [13-22] 18 (10/23 1344) BP: (110-155)/(51-77) 124/52 (10/23 1344) SpO2:  [99 %-100 %] 100 % (10/23 1003) Weight:  [72.6 kg] 72.6 kg (10/23 0309) Last BM Date: 09/27/20 General:   Alert,  Well-developed, well-nourished, pleasant and cooperative in NAD Head:  Normocephalic and atraumatic. Eyes:  Sclera clear, no icterus.   Conjunctiva pink. Ears:  Normal auditory acuity. Nose:  No deformity, discharge,  or lesions. Mouth:  No deformity or lesions.  Oropharynx pink & moist. Neck:  Supple; no masses or thyromegaly. Abdomen:  Soft, left-sided tenderness with voluntary guarding; no peritonitis. No masses, hepatosplenomegaly or hernias noted. Normal bowel sounds, without guarding, and without rebound.     Msk:  Symmetrical without gross deformities. Normal posture. Pulses:  Normal pulses noted. Extremities:  Without clubbing or edema. Neurologic:  Alert and  oriented x4;  grossly normal neurologically. Skin:  Intact without significant lesions or rashes. Psych:  Alert and cooperative. Normal mood and affect.   Lab Results: Recent Labs    09/28/20 1539 09/29/20 0533  WBC 5.2 5.0  HGB 13.0 11.7*  HCT 38.4 35.0*  PLT 259 209   BMET Recent Labs    09/28/20 1539 09/29/20 0533  NA 137 140  K 3.0* 4.1  CL 98 105  CO2 27 26  GLUCOSE 94 94  BUN 12 10  CREATININE 1.02* 1.07*  CALCIUM 9.9 9.2   LFT Recent Labs    09/29/20 0533  PROT 6.8  ALBUMIN 3.8  AST 19  ALT 17  ALKPHOS 49  BILITOT 1.2  BILIDIR 0.2  IBILI 1.0*   PT/INR No results for input(s): LABPROT, INR in the last 72 hours.  Studies/Results: CT ABDOMEN PELVIS W  CONTRAST  Addendum Date: 09/29/2020   ADDENDUM REPORT: 09/29/2020 08:19 ADDENDUM: Trace amount  of free fluid in the pelvis is nonspecific. Differential considerations given that the patient is on an Ace inhibitor could also include angioedema of the small bowel, a rare complication related to Ace inhibitor use. This usually occurs within days of initiating this medication but can occasionally occur after chronic use. Would correlate with any repeated history of abdominal pain without clear explanation. These results were called by telephone at the time of interpretation on 09/29/2020 at 8:18 am to provider Dr. Wyline Copas, Who verbally acknowledged these results. Electronically Signed   By: Zetta Bills M.D.   On: 09/29/2020 08:19   Result Date: 09/29/2020 CLINICAL DATA:  Acute abdominal pain, LEFT-sided abdominal and flank pain, no improvement. EXAM: CT ABDOMEN AND PELVIS WITH CONTRAST TECHNIQUE: Multidetector CT imaging of the abdomen and pelvis was performed using the standard protocol following bolus administration of intravenous contrast. CONTRAST:  120mL OMNIPAQUE IOHEXOL 300 MG/ML  [facility] COMPARISON:  November 01, 2018 FINDINGS: Lower chest: No consolidation.  No pleural effusion. Hepatobiliary: Hepatic hemangioma in the RIGHT hepatic lobe measures 4.3 x 3.3 cm, enlarged from November of 2019 where it measured approximately 3.1 x 2.8 cm. Hepatic hemangioma in the caudate and other small hemangiomata and cysts are stable. Portal vein is patent. Hepatic veins are patent. Post cholecystectomy with mild cholecystectomy biliary duct distension that is not changed. Pancreas: No pancreatic ductal dilation, inflammation or suspicious lesion. Stable intrapancreatic lipoma. Spleen: Spleen normal in size and contour. Adrenals/Urinary Tract: Adrenal glands are normal. Symmetric renal enhancement. Low-density lesion in the upper pole of the RIGHT kidney compatible with small cyst just at 1 cm. Symmetric renal enhancement. No hydronephrosis or suspicious renal lesion. Urinary bladder is normal. Stomach/Bowel: Marked  thickening of a segment of jejunum with perienteric stranding. No signs of pneumatosis. Question poor enhancement of the medial wall of the jejunum (image 29 of series 2) with marked mural stratification involving approximately 5 cm of the jejunum. Mild mesenteric stranding elsewhere in the jejunal mesentery. No ileal thickening. The appendix is normal. The remainder of the small bowel enhances normally. Colon is unremarkable. Vascular/Lymphatic: Calcified and noncalcified atheromatous plaque in the abdominal aorta. SMA is patent. SMV is patent. There is no gastrohepatic or hepatoduodenal ligament lymphadenopathy. No retroperitoneal or mesenteric lymphadenopathy. No pelvic adenopathy Reproductive: Post hysterectomy.  No adnexal mass. Other: No ascites.  No free air. Musculoskeletal: Sacral nerve stimulator, power pack over LEFT hip. No acute musculoskeletal findings. IMPRESSION: 1. Marked thickening of a segment of jejunum with perienteric stranding. Question poor enhancement of the medial wall of the jejunum (image 29 of series 2) marked mural stratification involving approximately 5 cm of the jejunum. Findings may represent infectious or inflammatory enteritis. Given segmental nature and severity ischemia is also considered. Some hypoenhancement is seen along the medial wall though this could be related to edema. Consider correlation with lactate and repeat imaging if symptoms should worsen. 2. Some mild mesenteric elsewhere within the jejunal mesentery though to a lesser extent and without the mural stratification of bowel wall thickening seen in the segment described above. 3. Hepatic hemangiomas and cysts. 4. Aortic atherosclerosis. These results were called by telephone at the time of interpretation on 09/28/2020 at 6:56 pm to provider [facility] , who verbally acknowledged these results. Aortic Atherosclerosis (ICD10-I70.0). Electronically Signed: By: Zetta Bills M.D. On: 09/28/2020 18:57     Impression:  1.  Progressive abdominal pain over the past  10+ days. 2.  Significant jejunal thickening on CT scan. 3.  Weight loss.  Plan:  1.  The duration of symptoms and significant degree of jejunal thickening seen on imaging and the focal nature of the process for this particular patient would all be highly atypical for an infectious process.  Accordingly, wonder if this could be inflammatory or ischemic in nature.  Less likely, but not excluded, would be some type of malignancy (lymphoma would most commonly present like this).  2.  I will obtain surgical consultation to see if they think this is some type of atypical mesenteric ischemia process, and to help shed provide potential insight into future management strategies (repeat imaging versus laparoscopy).  The jejunal thickening, based on my review of CT scan, seems out of reach of endo/enteroscopy. 3.  Supportive management for the time being, including IV fluids, antibiotics, judicious analgesics, NPO. 4.  [facility] will follow.   LOS: 0 days   Naythen Heikkila M  09/29/2020, 2:17 PM  Cell [phone] If no answer or after 5 PM call [phone]

## 2020-09-29 NOTE — Consult Note (Signed)
General Surgery [facility]  Reason for Consult: abdominal pain  Referring Physician: Dr. Wonda Horner, gastroenterology  Alexis Molina is an 69 y.o. female.   HPI: 69 yo female with two week history of abdominal pain.  Denies nausea or emesis, tolerating diet.  No unusual food history.  No prior episodes of abdominal pain like this one.  Denies fever or chills.  Previous abdominal surgery includes lap cholecystectomy and hysterectomy.  Laboratory studies show normal WBC, normal potassium, no acidosis, normal lactate.  CT abd reviewed and shows a thickened short segment of proximal jejunum without obstruction or pneumatosis.  Asked to evaluate by GI.  Past Medical History:  Diagnosis Date  . Allergic rhinitis   . Anxiety   . Body mass index 30.0-30.9, adult   . Cataract   . Cataracts, bilateral   . Chest pain   . Depression   . Diabetes mellitus without complication ([place])   . DM (diabetes mellitus) ([place])   . Fatigue   . Fatty liver   . FH: colonic polyps   . Hemangioma of intra-abdominal structures   . Hiatal hernia   . History of colonic polyps   . Hyperlipidemia   . Hypertension   . Hypothyroidism   . IBS (irritable bowel syndrome)   . Obesity, unspecified   . Osteopenia   . 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 Peripheral edema   . Peripheral edema    after knee surgery  . Raynaud's syndrome without gangrene   . Snake bite   . SOB (shortness of breath)   . Thyroid disease   . Tubular adenoma of colon   . Vitamin D deficiency   . Vitreous floaters of right eye     Past Surgical History:  Procedure Laterality Date  . ABDOMINAL HYSTERECTOMY    . CARDIAC CATHETERIZATION N/A 07/30/2015   Procedure: Left Heart Cath and Coronary Angiography;  Surgeon: Jettie Booze, MD;  Location: [place] [facility];  Service: Cardiovascular;  Laterality: N/A;  . CARPAL TUNNEL RELEASE    . CHOLECYSTECTOMY    . KNEE SURGERY      Family History   Problem Relation Age of Onset  . Alzheimer's disease Mother   . Diabetes Mother   . Hypertension Mother   . Ulcers Father   . Hypertension Father   . Arthritis Father   . Heart failure Father   . Lung cancer Brother     Social History:  reports that she has quit smoking. She has never used smokeless tobacco. She reports that she does not drink alcohol and does not use drugs.  Allergies:  Allergies  Allergen Reactions  . Darvocet [Propoxyphene N-Acetaminophen] Other (See Comments)    Asthma attack  . Orange Fruit [Citrus] Itching and Swelling  . Vicodin [Hydrocodone-Acetaminophen] Hives and Itching  . Dust Mite Extract Itching  . Morphine Itching  . Glimepiride Itching  . Penicillins Hives  . Sulfa Antibiotics Hives    Medications: I have reviewed the patient's current medications.  Results for orders placed or performed during the hospital encounter of 09/28/20 (from the past 48 hour(s))  Urinalysis, Routine w reflex microscopic Urine, Clean Catch     Status: Abnormal   Collection Time: 09/28/20  3:30 PM  Result Value Ref Range   Color, Urine YELLOW YELLOW   APPearance CLEAR CLEAR   Specific Gravity, Urine 1.010 1.005 - 1.030   pH 7.5 5.0 - 8.0   Glucose, UA NEGATIVE  NEGATIVE mg/dL   Hgb urine dipstick NEGATIVE NEGATIVE   Bilirubin Urine NEGATIVE NEGATIVE   Ketones, ur NEGATIVE NEGATIVE mg/dL   Protein, ur NEGATIVE NEGATIVE mg/dL   Nitrite NEGATIVE NEGATIVE   Leukocytes,Ua SMALL (A) NEGATIVE    Comment: Performed at [facility], [address] [place], [place] [address]  Urinalysis, Microscopic (reflex)     Status: Abnormal   Collection Time: 09/28/20  3:30 PM  Result Value Ref Range   RBC / HPF 0-5 0 - 5 RBC/hpf   WBC, UA 0-5 0 - 5 WBC/hpf   Bacteria, UA FEW (A) NONE SEEN   Squamous Epithelial / LPF 0-5 0 - 5    Comment: Performed at [facility], [address] [place], [place] [address]  Lipase, blood     Status: None   Collection  Time: 09/28/20  3:39 PM  Result Value Ref Range   Lipase 40 11 - 51 U/L    Comment: Performed at [facility], [address] [place], [place] [address]  Comprehensive metabolic panel     Status: Abnormal   Collection Time: 09/28/20  3:39 PM  Result Value Ref Range   Sodium 137 135 - 145 mmol/L   Potassium 3.0 (L) 3.5 - 5.1 mmol/L   Chloride 98 98 - 111 mmol/L   CO2 27 22 - 32 mmol/L   Glucose, Bld 94 70 - 99 mg/dL    Comment: Glucose reference range applies only to samples taken after fasting for at least 8 hours.   BUN 12 8 - 23 mg/dL   Creatinine, Ser 1.02 (H) 0.44 - 1.00 mg/dL   Calcium 9.9 8.9 - 10.3 mg/dL   Total Protein 8.1 6.5 - 8.1 g/dL   Albumin 4.5 3.5 - 5.0 g/dL   AST 24 15 - 41 U/L   ALT 20 0 - 44 U/L   Alkaline Phosphatase 54 38 - 126 U/L   Total Bilirubin 1.1 0.3 - 1.2 mg/dL   GFR, Estimated 60 (L) >60 mL/min    Comment: (NOTE) Calculated using the CKD-EPI Creatinine Equation (2021)    Anion gap 12 5 - 15    Comment: Performed at [facility], [address] [place], [place] [address]  CBC     Status: Abnormal   Collection Time: 09/28/20  3:39 PM  Result Value Ref Range   WBC 5.2 4.0 - 10.5 K/uL   RBC 4.82 3.87 - 5.11 MIL/uL   Hemoglobin 13.0 12.0 - 15.0 g/dL   HCT 38.4 36 - 46 %   MCV 79.7 (L) 80.0 - 100.0 fL   MCH 27.0 26.0 - 34.0 pg   MCHC 33.9 30.0 - 36.0 g/dL   RDW 14.1 11.5 - 15.5 %   Platelets 259 150 - 400 K/uL   nRBC 0.0 0.0 - 0.2 %    Comment: Performed at [facility], [address] [place], [place] [address]  Magnesium     Status: None   Collection Time: 09/28/20  3:39 PM  Result Value Ref Range   Magnesium 1.7 1.7 - 2.4 mg/dL    Comment: Performed at [facility], [address] [place], [place] [address]  Lactic acid, plasma     Status: None   Collection Time: 09/28/20  6:42 PM  Result Value Ref Range   Lactic Acid, Venous 1.3 0.5 - 1.9 mmol/L    Comment: Performed at [facility], [address] [place], [place] [address]  Lactic acid, plasma     Status: None   Collection Time: 09/28/20  8:38 PM  Result Value Ref Range   Lactic Acid, Venous 1.2 0.5 - 1.9 mmol/L    Comment: Performed at [facility], [address] [place], [place] [address]  Respiratory Panel by RT PCR (Flu A&B, Covid) - Nasopharyngeal Swab     Status: None   Collection Time: 09/29/20 12:10 AM   Specimen: Nasopharyngeal Swab  Result Value Ref Range   SARS Coronavirus 2 by RT PCR NEGATIVE NEGATIVE    Comment: (NOTE) SARS-CoV-2 target nucleic acids are NOT DETECTED.  The SARS-CoV-2 RNA is generally detectable in upper respiratoy specimens during the acute phase of infection. The lowest concentration of SARS-CoV-2 viral copies this assay can detect is 131 copies/mL. A negative result does not preclude SARS-Cov-2 infection and should not be used as the sole basis for treatment or other patient management decisions. A negative result may occur with  improper specimen collection/handling, submission of specimen other than nasopharyngeal swab, presence of viral mutation(s) within the areas targeted by this assay, and inadequate number of viral copies (<131 copies/mL). A negative result must be combined with clinical observations, patient history, and epidemiological information. The expected result is Negative.  Fact Sheet for Patients:  PinkCheek.be  Fact Sheet for Healthcare Providers:  GravelBags.it  This test is no t yet approved or cleared by the [place] FDA and  has been authorized for detection and/or diagnosis of SARS-CoV-2 by FDA under an Emergency Use Authorization (EUA). This EUA will remain  in effect (meaning this test can be used) for the duration of the COVID-19 declaration under Section 564(b)(1) of the Act, 21 U.S.C. section 360bbb-3(b)(1), unless the authorization is terminated or revoked  sooner.     Influenza A by PCR NEGATIVE NEGATIVE   Influenza B by PCR NEGATIVE NEGATIVE    Comment: (NOTE) The Xpert Xpress SARS-CoV-2/FLU/RSV assay is intended as an aid in  the diagnosis of influenza from Nasopharyngeal swab specimens and  should not be used as a sole basis for treatment. Nasal washings and  aspirates are unacceptable for Xpert Xpress SARS-CoV-2/FLU/RSV  testing.  Fact Sheet for Patients: PinkCheek.be  Fact Sheet for Healthcare Providers: GravelBags.it  This test is not yet approved or cleared by the [place] FDA and  has been authorized for detection and/or diagnosis of SARS-CoV-2 by  FDA under an Emergency Use Authorization (EUA). This EUA will remain  in effect (meaning this test can be used) for the duration of the  Covid-19 declaration under Section 564(b)(1) of the Act, 21  U.S.C. section 360bbb-3(b)(1), unless the authorization is  terminated or revoked. Performed at [facility], [address] [place], [place] [address]   HIV Antibody (routine testing w rflx)     Status: None   Collection Time: 09/29/20  5:33 AM  Result Value Ref Range   HIV Screen 4th Generation wRfx Non Reactive Non Reactive    Comment: Performed at [facility], [place] [address] [place], [place] [address]  Hepatic function panel     Status: Abnormal   Collection Time: 09/29/20  5:33 AM  Result Value Ref Range   Total Protein 6.8 6.5 - 8.1 g/dL   Albumin 3.8 3.5 - 5.0 g/dL   AST 19 15 - 41 U/L   ALT 17 0 - 44 U/L  Alkaline Phosphatase 49 38 - 126 U/L   Total Bilirubin 1.2 0.3 - 1.2 mg/dL   Bilirubin, Direct 0.2 0.0 - 0.2 mg/dL   Indirect Bilirubin 1.0 (H) 0.3 - 0.9 mg/dL    Comment: Performed at [facility], [place] [address], [place], [place] [address]  Basic metabolic panel     Status: Abnormal   Collection Time: 09/29/20  5:33 AM  Result Value Ref Range   Sodium 140 135 -  145 mmol/L   Potassium 4.1 3.5 - 5.1 mmol/L   Chloride 105 98 - 111 mmol/L   CO2 26 22 - 32 mmol/L   Glucose, Bld 94 70 - 99 mg/dL    Comment: Glucose reference range applies only to samples taken after fasting for at least 8 hours.   BUN 10 8 - 23 mg/dL   Creatinine, Ser 1.07 (H) 0.44 - 1.00 mg/dL   Calcium 9.2 8.9 - 10.3 mg/dL   GFR, Estimated 57 (L) >60 mL/min    Comment: (NOTE) Calculated using the CKD-EPI Creatinine Equation (2021)    Anion gap 9 5 - 15    Comment: Performed at [facility], [place] [address] [place], [place] [address]  CBC WITH DIFFERENTIAL     Status: Abnormal   Collection Time: 09/29/20  5:33 AM  Result Value Ref Range   WBC 5.0 4.0 - 10.5 K/uL   RBC 4.34 3.87 - 5.11 MIL/uL   Hemoglobin 11.7 (L) 12.0 - 15.0 g/dL   HCT 35.0 (L) 36 - 46 %   MCV 80.6 80.0 - 100.0 fL   MCH 27.0 26.0 - 34.0 pg   MCHC 33.4 30.0 - 36.0 g/dL   RDW 13.8 11.5 - 15.5 %   Platelets 209 150 - 400 K/uL   nRBC 0.0 0.0 - 0.2 %   Neutrophils Relative % 65 %   Neutro Abs 3.2 1.7 - 7.7 K/uL   Lymphocytes Relative 24 %   Lymphs Abs 1.2 0.7 - 4.0 K/uL   Monocytes Relative 10 %   Monocytes Absolute 0.5 0.1 - 1.0 K/uL   Eosinophils Relative 1 %   Eosinophils Absolute 0.1 0.0 - 0.5 K/uL   Basophils Relative 0 %   Basophils Absolute 0.0 0.0 - 0.1 K/uL   Immature Granulocytes 0 %   Abs Immature Granulocytes 0.01 0.00 - 0.07 K/uL    Comment: Performed at [facility], [place] [address] [place], [place] [address]  Lactic acid, plasma     Status: Abnormal   Collection Time: 09/29/20  5:33 AM  Result Value Ref Range   Lactic Acid, Venous 2.5 (HH) 0.5 - 1.9 mmol/L    Comment: CRITICAL RESULT CALLED TO, READ BACK BY AND VERIFIED WITH: PREZ,E. RN AT [phone] 09/29/20 MULLINS,T Performed at [facility], [place] [address] [place], [place] [address]   Lipase, blood     Status: None   Collection Time: 09/29/20  5:33 AM  Result Value Ref Range    Lipase 32 11 - 51 U/L    Comment: Performed at [facility], [place] [address] [place], [place] [address]  Troponin I (High Sensitivity)     Status: None   Collection Time: 09/29/20  5:33 AM  Result Value Ref Range   Troponin I (High Sensitivity) 6 <18 ng/L    Comment: (NOTE) Elevated high sensitivity troponin I (hsTnI) values and significant  changes across serial measurements may suggest ACS but many other  chronic and acute  conditions are known to elevate hsTnI results.  Refer to the "Links" section for chest pain algorithms and additional  guidance. Performed at [facility], [place] [address], [place], [place] [address]   Lactic acid, plasma     Status: None   Collection Time: 09/29/20  7:37 AM  Result Value Ref Range   Lactic Acid, Venous 0.8 0.5 - 1.9 mmol/L    Comment: Performed at [facility], [place] [address] [place], [place] [address]  Glucose, capillary     Status: None   Collection Time: 09/29/20 11:35 AM  Result Value Ref Range   Glucose-Capillary 72 70 - 99 mg/dL    Comment: Glucose reference range applies only to samples taken after fasting for at least 8 hours.    CT ABDOMEN PELVIS W CONTRAST  Addendum Date: 09/29/2020   ADDENDUM REPORT: 09/29/2020 08:19 ADDENDUM: Trace amount of free fluid in the pelvis is nonspecific. Differential considerations given that the patient is on an Ace inhibitor could also include angioedema of the small bowel, a rare complication related to Ace inhibitor use. This usually occurs within days of initiating this medication but can occasionally occur after chronic use. Would correlate with any repeated history of abdominal pain without clear explanation. These results were called by telephone at the time of interpretation on 09/29/2020 at 8:18 am to provider Dr. Wyline Copas, Who verbally acknowledged these results. Electronically Signed   By: Zetta Bills M.D.   On: 09/29/2020 08:19   Result  Date: 09/29/2020 CLINICAL DATA:  Acute abdominal pain, LEFT-sided abdominal and flank pain, no improvement. EXAM: CT ABDOMEN AND PELVIS WITH CONTRAST TECHNIQUE: Multidetector CT imaging of the abdomen and pelvis was performed using the standard protocol following bolus administration of intravenous contrast. CONTRAST:  177mL OMNIPAQUE IOHEXOL 300 MG/ML  SOLN COMPARISON:  November 01, 2018 FINDINGS: Lower chest: No consolidation.  No pleural effusion. Hepatobiliary: Hepatic hemangioma in the RIGHT hepatic lobe measures 4.3 x 3.3 cm, enlarged from November of 2019 where it measured approximately 3.1 x 2.8 cm. Hepatic hemangioma in the caudate and other small hemangiomata and cysts are stable. Portal vein is patent. Hepatic veins are patent. Post cholecystectomy with mild cholecystectomy biliary duct distension that is not changed. Pancreas: No pancreatic ductal dilation, inflammation or suspicious lesion. Stable intrapancreatic lipoma. Spleen: Spleen normal in size and contour. Adrenals/Urinary Tract: Adrenal glands are normal. Symmetric renal enhancement. Low-density lesion in the upper pole of the RIGHT kidney compatible with small cyst just at 1 cm. Symmetric renal enhancement. No hydronephrosis or suspicious renal lesion. Urinary bladder is normal. Stomach/Bowel: Marked thickening of a segment of jejunum with perienteric stranding. No signs of pneumatosis. Question poor enhancement of the medial wall of the jejunum (image 29 of series 2) with marked mural stratification involving approximately 5 cm of the jejunum. Mild mesenteric stranding elsewhere in the jejunal mesentery. No ileal thickening. The appendix is normal. The remainder of the small bowel enhances normally. Colon is unremarkable. Vascular/Lymphatic: Calcified and noncalcified atheromatous plaque in the abdominal aorta. SMA is patent. SMV is patent. There is no gastrohepatic or hepatoduodenal ligament lymphadenopathy. No retroperitoneal or mesenteric  lymphadenopathy. No pelvic adenopathy Reproductive: Post hysterectomy.  No adnexal mass. Other: No ascites.  No free air. Musculoskeletal: Sacral nerve stimulator, power pack over LEFT hip. No acute musculoskeletal findings. IMPRESSION: 1. Marked thickening of a segment of jejunum with perienteric stranding. Question poor enhancement of the medial wall of the jejunum (image 29 of series 2) marked mural  stratification involving approximately 5 cm of the jejunum. Findings may represent infectious or inflammatory enteritis. Given segmental nature and severity ischemia is also considered. Some hypoenhancement is seen along the medial wall though this could be related to edema. Consider correlation with lactate and repeat imaging if symptoms should worsen. 2. Some mild mesenteric elsewhere within the jejunal mesentery though to a lesser extent and without the mural stratification of bowel wall thickening seen in the segment described above. 3. Hepatic hemangiomas and cysts. 4. Aortic atherosclerosis. These results were called by telephone at the time of interpretation on 09/28/2020 at 6:56 pm to provider [facility] , who verbally acknowledged these results. Aortic Atherosclerosis (ICD10-I70.0). Electronically Signed: By: Zetta Bills M.D. On: 09/28/2020 18:57    Review of Systems  Constitutional: Negative.   HENT: Negative.   Eyes: Negative.   Respiratory: Negative.   Cardiovascular: Negative.   Gastrointestinal: Positive for abdominal pain. Negative for abdominal distention, blood in stool, constipation, diarrhea, nausea and vomiting.  Endocrine: Negative.   Genitourinary: Negative.   Musculoskeletal: Negative.   Skin: Negative.   Allergic/Immunologic: Negative.   Neurological: Negative.   Hematological: Negative.   Psychiatric/Behavioral: Negative.     Physical Exam  Blood pressure (!) 124/52, pulse (!) 56, temperature 98.8 F (37.1 C), temperature source Oral, resp. rate 18, height 5'  2" (1.575 m), weight 72.6 kg, SpO2 100 %.  CONSTITUTIONAL: no acute distress; conversant; no obvious deformities  EYES: conjunctiva moist; no lid lag; anicteric; pupils equal bilaterally  NECK: trachea midline; no thyroid nodularity  LUNGS: respiratory effort normal & unlabored; no wheeze; no rales; no tactile fremitus  CV: rate and rhythm regular; no palpable thrills; no murmur; no edema bilat lower extremities  GI: abdomen soft without distension; mild tenderness left mid abdomen; no mass; no hepatosplenomegaly; no obvious hernia; well healed surgical incisions  MSK: normal range of motion of extremities; no clubbing; no cyanosis  PSYCH: appropriate affect for situation; alert and oriented to person, place, & time  LYMPHATIC: no palpable cervical lymphadenopathy; no evidence lymphedema in extremities   Assessment/Plan: Abdominal pain, focal thickening of segment of jejunum  Possible etiologies include enteritis (infectious vs ishchemic), neoplastic, foreign body (chicken or fish bone)  Clinically stable with no evidence of intestinal ischemia or obstruction  Would allow liquid diet for now  Will follow - if clinically improves, no acute intervention; if persistent symptoms or deteriorates, consider laparoscopy with possible resection  Armandina Gemma, MD [facility] Office: [place] 09/29/2020, 4:12 PM

## 2020-09-29 NOTE — H&P (Signed)
History and Physical    Alexis Molina:[id number] DOB: 06/28/1951 DOA: 09/28/2020  PCP: Hulan Fess, MD  Patient coming from: Home.  Chief Complaint: Abdominal pain.  HPI: Alexis Molina is a 69 y.o. female with history of hypertension, post ablative hypothyroidism presents to the ER at [facility] with complaints of abdominal pain.  Patient has benign left upper quadrant and epigastric abdominal discomfort for the last 10 days with loose stools.  Patient only has one episode of loose stools every day with no blood in it denies any fever chills vomiting is able to tolerate diet and is able to take her medications.  The abdominal pain is not related to food.  Patient states she has had an EGD last year at [facility] for abdominal discomfort and was told she had a narrow esophagus.  Has had a procedure done for bowel incontinence 2 years ago at [facility] by Dr. Amalia Hailey.  Since the pain was worsening patient came to the ER.  Denies taking any antibiotics recently.  ED Course: In the ER patient was afebrile not hypoxic and Covid test was negative.  Labs are largely unremarkable with creatinine mildly elevated at baseline.  LFTs were normal.  CT abdomen pelvis shows jejunal thickening concerning for infectious versus inflammatory possible ischemic enteritis.  Lactic acid was normal.  Patient was started on empiric antibiotics fluids and admitted for further management.  Potassium was mildly low.  Review of Systems: As per HPI, rest all negative.   Past Medical History:  Diagnosis Date  . Allergic rhinitis   . Anxiety   . Body mass index 30.0-30.9, adult   . Cataract   . Cataracts, bilateral   . Chest pain   . Depression   . Diabetes mellitus without complication ([place])   . DM (diabetes mellitus) ([place])   . Fatigue   . Fatty liver   . FH: colonic polyps   . Hemangioma of intra-abdominal structures   . Hiatal hernia   . History of colonic polyps   .  Hyperlipidemia   . Hypertension   . Hypothyroidism   . IBS (irritable bowel syndrome)   . Obesity, unspecified   . Osteopenia   . 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 Peripheral edema   . Peripheral edema    after knee surgery  . Raynaud's syndrome without gangrene   . Snake bite   . SOB (shortness of breath)   . Thyroid disease   . Tubular adenoma of colon   . Vitamin D deficiency   . Vitreous floaters of right eye     Past Surgical History:  Procedure Laterality Date  . ABDOMINAL HYSTERECTOMY    . CARDIAC CATHETERIZATION N/A 07/30/2015   Procedure: Left Heart Cath and Coronary Angiography;  Surgeon: Jettie Booze, MD;  Location: [facility];  Service: Cardiovascular;  Laterality: N/A;  . CARPAL TUNNEL RELEASE    . CHOLECYSTECTOMY    . KNEE SURGERY       reports that she has quit smoking. She has never used smokeless tobacco. She reports that she does not drink alcohol and does not use drugs.  Allergies  Allergen Reactions  . Darvocet [Propoxyphene N-Acetaminophen] Other (See Comments)    Asthma attack  . Orange Fruit [Citrus] Itching and Swelling  . Vicodin [Hydrocodone-Acetaminophen] Hives and Itching  . Dust Mite Extract Itching  . Morphine Itching  . Glimepiride Itching  . Penicillins Hives  . Sulfa  Antibiotics Hives    Family History  Problem Relation Age of Onset  . Alzheimer's disease Mother   . Diabetes Mother   . Hypertension Mother   . Ulcers Father   . Hypertension Father   . Arthritis Father   . Heart failure Father   . Lung cancer Brother     Prior to Admission medications   Medication Sig Start Date End Date Taking? Authorizing Provider  albuterol (VENTOLIN HFA) 108 (90 Base) MCG/ACT inhaler Inhale 2 puffs into the lungs every 4 (four) hours as needed for wheezing or shortness of breath.    [provider]  Cholecalciferol (VITAMIN D) 125 MCG (5000 UT) CAPS Take by mouth.    [provider]   diphenhydrAMINE (BENADRYL) 25 MG tablet Take 25 mg by mouth every 6 (six) hours as needed.    [provider]  fexofenadine (ALLEGRA) 180 MG tablet Take 180 mg by mouth at bedtime.    [provider]  levothyroxine (SYNTHROID, LEVOTHROID) 75 MCG tablet Take 75 mcg by mouth daily before breakfast.    [provider]  lisinopril (PRINIVIL,ZESTRIL) 20 MG tablet Take 20 mg by mouth daily.    [provider]  Melatonin 5 MG TABS Take 1 tablet by mouth at bedtime.    [provider]  metoprolol tartrate (LOPRESSOR) 50 MG tablet Take 1 tablet by mouth 2 hours prior to Cardiac CT 12/21/19   Jettie Booze, MD  montelukast (SINGULAIR) 10 MG tablet Take 10 mg by mouth daily.    [provider]  Multiple Vitamin (MULTIVITAMIN WITH MINERALS) TABS tablet Take 1 tablet by mouth daily.    [provider]  Omega-3 Fatty Acids (FISH OIL) 1000 MG CAPS Take 1 capsule by mouth daily.    [provider]  omeprazole (PRILOSEC) 40 MG capsule Take 40 mg by mouth daily.    [provider]  polycarbophil (FIBERCON) 625 MG tablet Take 625 mg by mouth daily.    [provider]  rosuvastatin (CRESTOR) 20 MG tablet Take 20 mg by mouth daily.    [provider]  triamterene-hydrochlorothiazide (MAXZIDE) 75-50 MG per tablet Take 1 tablet by mouth daily.    [provider]    Physical Exam: Constitutional: Moderately built and nourished. Vitals:   09/29/20 0130 09/29/20 0200 09/29/20 0250 09/29/20 0309  BP: (!) 126/56 (!) 126/51 (!) 129/54   Pulse: (!) 59 (!) 59 61   Resp: 14 17 17    Temp:   98.5 F (36.9 C)   TempSrc:      SpO2: 99% 99% 100%   Weight:    72.6 kg  Height:    5\' 2"  (1.575 m)   Eyes: Anicteric no pallor. ENMT: No discharge from the ears eyes nose or mouth. Neck: No mass felt.  No neck rigidity. Respiratory: No rhonchi or crepitations. Cardiovascular: S1-S2 heard. Abdomen: Epigastric and  left upper quadrant tenderness no rigidity or rebound tenderness. Musculoskeletal: No edema. Skin: No rash. Neurologic: Alert awake oriented to time place and person.  Moves all extremities. Psychiatric: Appears normal.  Normal affect.   Labs on Admission: I have personally reviewed following labs and imaging studies  CBC: Recent Labs  Lab 09/28/20 1539  WBC 5.2  HGB 13.0  HCT 38.4  MCV 79.7*  PLT 662   Basic Metabolic Panel: Recent Labs  Lab 09/28/20 1539  NA 137  K 3.0*  CL 98  CO2 27  GLUCOSE 94  BUN 12  CREATININE 1.02*  CALCIUM 9.9  MG 1.7   GFR: Estimated Creatinine Clearance: 49.3 mL/min (A) (by C-G formula based on SCr of 1.02 mg/dL (H)). Liver Function Tests: Recent Labs  Lab 09/28/20 1539  AST 24  ALT 20  ALKPHOS 54  BILITOT 1.1  PROT 8.1  ALBUMIN 4.5   Recent Labs  Lab 09/28/20 1539  LIPASE 40   No results for input(s): AMMONIA in the last 168 hours. Coagulation Profile: No results for input(s): INR, PROTIME in the last 168 hours. Cardiac Enzymes: No results for input(s): CKTOTAL, CKMB, CKMBINDEX, TROPONINI in the last 168 hours. BNP (last 3 results) No results for input(s): PROBNP in the last 8760 hours. HbA1C: No results for input(s): HGBA1C in the last 72 hours. CBG: No results for input(s): GLUCAP in the last 168 hours. Lipid Profile: No results for input(s): CHOL, HDL, LDLCALC, TRIG, CHOLHDL, LDLDIRECT in the last 72 hours. Thyroid Function Tests: No results for input(s): TSH, T4TOTAL, FREET4, T3FREE, THYROIDAB in the last 72 hours. Anemia Panel: No results for input(s): VITAMINB12, FOLATE, FERRITIN, TIBC, IRON, RETICCTPCT in the last 72 hours. Urine analysis:    Component Value Date/Time   COLORURINE YELLOW 09/28/2020 1530   APPEARANCEUR CLEAR 09/28/2020 1530   LABSPEC 1.010 09/28/2020 1530   PHURINE 7.5 09/28/2020 1530   GLUCOSEU NEGATIVE 09/28/2020 1530   HGBUR NEGATIVE 09/28/2020 1530   BILIRUBINUR NEGATIVE 09/28/2020  1530   KETONESUR NEGATIVE 09/28/2020 1530   PROTEINUR NEGATIVE 09/28/2020 1530   UROBILINOGEN 0.2 06/23/2013 2122   NITRITE NEGATIVE 09/28/2020 1530   LEUKOCYTESUR SMALL (A) 09/28/2020 1530   Sepsis Labs: @LABRCNTIP (procalcitonin:4,lacticidven:4) ) Recent Results (from the past 240 hour(s))  Respiratory Panel by RT PCR (Flu A&B, Covid) - Nasopharyngeal Swab     Status: None   Collection Time: 09/29/20 12:10 AM   Specimen: Nasopharyngeal Swab  Result Value Ref Range Status   SARS Coronavirus 2 by RT PCR NEGATIVE NEGATIVE Final    Comment: (NOTE) SARS-CoV-2 target nucleic acids are NOT DETECTED.  The SARS-CoV-2 RNA is generally detectable in upper respiratoy specimens during the acute phase of infection. The lowest concentration of SARS-CoV-2 viral copies this assay can detect is 131 copies/mL. A negative result does not preclude SARS-Cov-2 infection and should not be used as the sole basis for treatment or other patient management decisions. A negative result may occur with  improper specimen collection/handling, submission of specimen other than nasopharyngeal swab, presence of viral mutation(s) within the areas targeted by this assay, and inadequate number of viral copies (<131 copies/mL). A negative result must be combined with clinical observations, patient history, and epidemiological information. The expected result is Negative.  Fact Sheet for Patients:  PinkCheek.be  Fact Sheet for Healthcare Providers:  GravelBags.it  This test is no t yet approved or cleared by the [place] FDA and  has been authorized for detection and/or diagnosis of SARS-CoV-2 by FDA under an Emergency Use Authorization (EUA). This EUA will remain  in effect (meaning this test can be used) for the duration of the COVID-19 declaration under Section 564(b)(1) of the Act, 21 U.S.C. section 360bbb-3(b)(1), unless the authorization is  terminated or revoked sooner.     Influenza A by PCR NEGATIVE NEGATIVE Final   Influenza B by PCR NEGATIVE NEGATIVE Final    Comment: (NOTE) The Xpert Xpress SARS-CoV-2/FLU/RSV assay is intended as an aid in  the diagnosis of influenza from Nasopharyngeal swab specimens and  should not be used as a sole basis for  treatment. Nasal washings and  aspirates are unacceptable for Xpert Xpress SARS-CoV-2/FLU/RSV  testing.  Fact Sheet for Patients: PinkCheek.be  Fact Sheet for Healthcare Providers: GravelBags.it  This test is not yet approved or cleared by the [place] FDA and  has been authorized for detection and/or diagnosis of SARS-CoV-2 by  FDA under an Emergency Use Authorization (EUA). This EUA will remain  in effect (meaning this test can be used) for the duration of the  Covid-19 declaration under Section 564(b)(1) of the Act, 21  U.S.C. section 360bbb-3(b)(1), unless the authorization is  terminated or revoked. Performed at [facility], [address] [place], [place] [address]      Radiological Exams on Admission: CT ABDOMEN PELVIS W CONTRAST  Result Date: 09/28/2020 CLINICAL DATA:  Acute abdominal pain, LEFT-sided abdominal and flank pain, no improvement. EXAM: CT ABDOMEN AND PELVIS WITH CONTRAST TECHNIQUE: Multidetector CT imaging of the abdomen and pelvis was performed using the standard protocol following bolus administration of intravenous contrast. CONTRAST:  12mL OMNIPAQUE IOHEXOL 300 MG/ML  SOLN COMPARISON:  November 01, 2018 FINDINGS: Lower chest: No consolidation.  No pleural effusion. Hepatobiliary: Hepatic hemangioma in the RIGHT hepatic lobe measures 4.3 x 3.3 cm, enlarged from November of 2019 where it measured approximately 3.1 x 2.8 cm. Hepatic hemangioma in the caudate and other small hemangiomata and cysts are stable. Portal vein is patent. Hepatic veins are patent. Post cholecystectomy  with mild cholecystectomy biliary duct distension that is not changed. Pancreas: No pancreatic ductal dilation, inflammation or suspicious lesion. Stable intrapancreatic lipoma. Spleen: Spleen normal in size and contour. Adrenals/Urinary Tract: Adrenal glands are normal. Symmetric renal enhancement. Low-density lesion in the upper pole of the RIGHT kidney compatible with small cyst just at 1 cm. Symmetric renal enhancement. No hydronephrosis or suspicious renal lesion. Urinary bladder is normal. Stomach/Bowel: Marked thickening of a segment of jejunum with perienteric stranding. No signs of pneumatosis. Question poor enhancement of the medial wall of the jejunum (image 29 of series 2) with marked mural stratification involving approximately 5 cm of the jejunum. Mild mesenteric stranding elsewhere in the jejunal mesentery. No ileal thickening. The appendix is normal. The remainder of the small bowel enhances normally. Colon is unremarkable. Vascular/Lymphatic: Calcified and noncalcified atheromatous plaque in the abdominal aorta. SMA is patent. SMV is patent. There is no gastrohepatic or hepatoduodenal ligament lymphadenopathy. No retroperitoneal or mesenteric lymphadenopathy. No pelvic adenopathy Reproductive: Post hysterectomy.  No adnexal mass. Other: No ascites.  No free air. Musculoskeletal: Sacral nerve stimulator, power pack over LEFT hip. No acute musculoskeletal findings. IMPRESSION: 1. Marked thickening of a segment of jejunum with perienteric stranding. Question poor enhancement of the medial wall of the jejunum (image 29 of series 2) marked mural stratification involving approximately 5 cm of the jejunum. Findings may represent infectious or inflammatory enteritis. Given segmental nature and severity ischemia is also considered. Some hypoenhancement is seen along the medial wall though this could be related to edema. Consider correlation with lactate and repeat imaging if symptoms should worsen. 2. Some  mild mesenteric elsewhere within the jejunal mesentery though to a lesser extent and without the mural stratification of bowel wall thickening seen in the segment described above. 3. Hepatic hemangiomas and cysts. 4. Aortic atherosclerosis. These results were called by telephone at the time of interpretation on 09/28/2020 at 6:56 pm to provider [facility] , who verbally acknowledged these results. Aortic Atherosclerosis (ICD10-I70.0). Electronically Signed   By: Zetta Bills M.D.   On:  09/28/2020 18:57    EKG: Independently reviewed.  Normal sinus rhythm.  Nonspecific changes.  Assessment/Plan Principal Problem:   Generalized abdominal pain Active Problems:   Enteritis   Essential hypertension   Hypothyroidism   Abdominal pain    1. Abdominal pain with CAT scan showing features concerning for enteritis involving the jejunal area among the differentials include inflammatory was infectious and possible ischemic though lactic acid is normal at this time.  We will keep patient n.p.o. IV fluids pain relief medication empiric antibiotics repeat labs and consult GI for further recommendations. 2. History of bowel incontinence following which patient had a implant placed at [facility] 2 years ago. 3. Hypertension we will keep patient as needed IV labetalol for now since patient is going to be n.p.o. 4. Hypothyroidism we will need to dose Synthroid IV once dose confirmed. 5. Chronic and disease stage II creatinine appears to be at baseline. 6. Mild hypokalemia likely from loose stools replace and recheck.  Since patient has ongoing abdominal pain cause of which is not clear will need further work-up and close monitoring for any further worsening in inpatient status.  Home medications needs to be verified.   DVT prophylaxis: Heparin. Code Status: Full code. Family Communication: Discussed with patient. Disposition Plan: Home. Consults called: GI notified through secure chat. Admission  status: Inpatient.   Rise Patience MD [facility] Pager [phone].  If 7PM-7AM, please contact night-coverage [email or web address] Password [email or web address]  09/29/2020, 5:18 AM

## 2020-09-30 DIAGNOSIS — I10 Essential (primary) hypertension: Secondary | ICD-10-CM | POA: Diagnosis not present

## 2020-09-30 DIAGNOSIS — K529 Noninfective gastroenteritis and colitis, unspecified: Secondary | ICD-10-CM | POA: Diagnosis not present

## 2020-09-30 DIAGNOSIS — R1084 Generalized abdominal pain: Secondary | ICD-10-CM | POA: Diagnosis not present

## 2020-09-30 LAB — CBC
HCT: 29.2 % — ABNORMAL LOW (ref 36.0–46.0)
Hemoglobin: 9.9 g/dL — ABNORMAL LOW (ref 12.0–15.0)
MCH: 27.4 pg (ref 26.0–34.0)
MCHC: 33.9 g/dL (ref 30.0–36.0)
MCV: 80.9 fL (ref 80.0–100.0)
Platelets: 168 10*3/uL (ref 150–400)
RBC: 3.61 MIL/uL — ABNORMAL LOW (ref 3.87–5.11)
RDW: 14.1 % (ref 11.5–15.5)
WBC: 3.2 10*3/uL — ABNORMAL LOW (ref 4.0–10.5)
nRBC: 0 % (ref 0.0–0.2)

## 2020-09-30 LAB — GLUCOSE, CAPILLARY
Glucose-Capillary: 107 mg/dL — ABNORMAL HIGH (ref 70–99)
Glucose-Capillary: 113 mg/dL — ABNORMAL HIGH (ref 70–99)
Glucose-Capillary: 126 mg/dL — ABNORMAL HIGH (ref 70–99)
Glucose-Capillary: 200 mg/dL — ABNORMAL HIGH (ref 70–99)

## 2020-09-30 LAB — COMPREHENSIVE METABOLIC PANEL
ALT: 15 U/L (ref 0–44)
AST: 20 U/L (ref 15–41)
Albumin: 3.1 g/dL — ABNORMAL LOW (ref 3.5–5.0)
Alkaline Phosphatase: 39 U/L (ref 38–126)
Anion gap: 6 (ref 5–15)
BUN: 8 mg/dL (ref 8–23)
CO2: 27 mmol/L (ref 22–32)
Calcium: 8.2 mg/dL — ABNORMAL LOW (ref 8.9–10.3)
Chloride: 107 mmol/L (ref 98–111)
Creatinine, Ser: 1.09 mg/dL — ABNORMAL HIGH (ref 0.44–1.00)
GFR, Estimated: 55 mL/min — ABNORMAL LOW (ref >60)
Glucose, Bld: 117 mg/dL — ABNORMAL HIGH (ref 70–99)
Potassium: 3.6 mmol/L (ref 3.5–5.1)
Sodium: 140 mmol/L (ref 135–145)
Total Bilirubin: 0.8 mg/dL (ref 0.3–1.2)
Total Protein: 5.5 g/dL — ABNORMAL LOW (ref 6.5–8.1)

## 2020-09-30 LAB — URINE CULTURE: Culture: <10000 — AB

## 2020-09-30 MED ORDER — PANTOPRAZOLE SODIUM 40 MG PO TBEC
40.0000 mg | DELAYED_RELEASE_TABLET | Freq: Every day | ORAL | Status: DC
  Administered 2020-09-30 – 2020-10-01 (×2): 40 mg via ORAL
  Filled 2020-09-30 (×2): qty 1

## 2020-09-30 MED ORDER — LEVOTHYROXINE SODIUM 50 MCG PO TABS
50.0000 ug | ORAL_TABLET | Freq: Every day | ORAL | Status: DC
  Administered 2020-09-30 – 2020-10-03 (×3): 50 ug via ORAL
  Filled 2020-09-30 (×3): qty 1

## 2020-09-30 MED ORDER — SIMETHICONE 80 MG PO CHEW
80.0000 mg | CHEWABLE_TABLET | Freq: Four times a day (QID) | ORAL | Status: DC | PRN
  Administered 2020-09-30 – 2020-10-01 (×3): 80 mg via ORAL
  Filled 2020-09-30 (×3): qty 1

## 2020-09-30 MED ORDER — ROSUVASTATIN CALCIUM 20 MG PO TABS
20.0000 mg | ORAL_TABLET | Freq: Every day | ORAL | Status: DC
  Administered 2020-09-30 – 2020-10-03 (×4): 20 mg via ORAL
  Filled 2020-09-30 (×4): qty 1

## 2020-09-30 MED ORDER — BOOST / RESOURCE BREEZE PO LIQD CUSTOM
1.0000 | Freq: Three times a day (TID) | ORAL | Status: DC
  Administered 2020-09-30 – 2020-10-03 (×5): 1 via ORAL

## 2020-09-30 MED ORDER — LIP MEDEX EX OINT
TOPICAL_OINTMENT | CUTANEOUS | Status: DC | PRN
  Filled 2020-09-30: qty 7

## 2020-09-30 NOTE — Progress Notes (Signed)
Subjective: Abdominal pain now more right-sided. Pain improving, but not resolved.  Objective: Vital signs in last 24 hours: Temp:  [98.7 F (37.1 C)-98.9 F (37.2 C)] 98.7 F (37.1 C) (10/24 0614) Pulse Rate:  [56-64] 64 (10/24 0614) Resp:  [18] 18 (10/24 0614) BP: (111-124)/(51-52) 111/51 (10/24 0614) SpO2:  [99 %] 99 % (10/24 0614) Weight change:  Last BM Date: 09/27/20  PE: GEN:  NAD ABD:  Soft, mild tenderness without peritonitis  Lab Results: CBC    Component Value Date/Time   WBC 3.2 (L) 09/30/2020 0610   RBC 3.61 (L) 09/30/2020 0610   HGB 9.9 (L) 09/30/2020 0610   HCT 29.2 (L) 09/30/2020 0610   PLT 168 09/30/2020 0610   MCV 80.9 09/30/2020 0610   MCH 27.4 09/30/2020 0610   MCHC 33.9 09/30/2020 0610   RDW 14.1 09/30/2020 0610   LYMPHSABS 1.2 09/29/2020 0533   MONOABS 0.5 09/29/2020 0533   EOSABS 0.1 09/29/2020 0533   BASOSABS 0.0 09/29/2020 0533   CMP     Component Value Date/Time   NA 140 09/30/2020 0610   NA 141 01/25/2020 1054   K 3.6 09/30/2020 0610   CL 107 09/30/2020 0610   CO2 27 09/30/2020 0610   GLUCOSE 117 (H) 09/30/2020 0610   BUN 8 09/30/2020 0610   BUN 15 01/25/2020 1054   CREATININE 1.09 (H) 09/30/2020 0610   CALCIUM 8.2 (L) 09/30/2020 0610   PROT 5.5 (L) 09/30/2020 0610   ALBUMIN 3.1 (L) 09/30/2020 0610   AST 20 09/30/2020 0610   ALT 15 09/30/2020 0610   ALKPHOS 39 09/30/2020 0610   BILITOT 0.8 09/30/2020 0610   GFRNONAA 55 (L) 09/30/2020 0610   GFRAA 63 01/25/2020 1054   Assessment:  1.  Progressive abdominal pain over the past 10+ days. 2.  Significant jejunal thickening on CT scan. 3.  Weight loss.  Plan:  1.  Advance diet. 2.  If continues to improve, would discharge home in the next couple days with follow-up imaging in 2-3 months.  If, on the other hand, she worsens, then would pursue CT enterography as inpatient as next step in management. 3.  Appreciate surgical consultation. 4.  [facility] will  follow.   Landry Dyke 09/30/2020, 1:03 PM   Cell [phone] If no answer or after 5 PM call [phone]

## 2020-09-30 NOTE — Progress Notes (Signed)
Initial Nutrition Assessment  INTERVENTION:   -Boost Breeze po TID, each supplement provides 250 kcal and 9 grams of protein  Once diet advanced, can resume Ensure supplements.   NUTRITION DIAGNOSIS:   Inadequate oral intake related to  (abdominal pain, enteritis) as evidenced by per patient/family report.  GOAL:   Patient will meet greater than or equal to 90% of their needs  MONITOR:   PO intake, Supplement acceptance, Labs, Weight trends, I & O's  REASON FOR ASSESSMENT:   Malnutrition Screening Tool    ASSESSMENT:   69 y.o. female with history of hypertension, post ablative hypothyroidism presents to the ER at [facility] with complaints of abdominal pain.  Patient has benign left upper quadrant and epigastric abdominal discomfort for the last 10 days with loose stools. Admitted for enteritis.  Patient currently on clear liquids. Pt reports abdominal pain for 2 weeks. Pt has been able to eat PTA but having pain and some nausea. Pt is tolerating clears. Ordered Boost Breeze supplements for added protein to diet. Ensure supplements were ordered for when diet is advanced.  Per weight records, no recent weight changes noted.  Medications reviewed. Labs reviewed: CBGs: 107-166  NUTRITION - FOCUSED PHYSICAL EXAM:  Unable to complete  Diet Order:   Diet Order            Diet clear liquid Room service appropriate? Yes; Fluid consistency: Thin  Diet effective now                 EDUCATION NEEDS:   No education needs have been identified at this time  Skin:  Skin Assessment: Reviewed RN Assessment  Last BM:  10/21  Height:   Ht Readings from Last 1 Encounters:  09/29/20 5\' 2"  (1.575 m)    Weight:   Wt Readings from Last 1 Encounters:  09/29/20 72.6 kg    BMI:  Body mass index is 29.28 kg/m.  Estimated Nutritional Needs:   Kcal:  1800-2000  Protein:  80-90g  Fluid:  1.8L/day  Clayton Bibles, MS, RD, LDN Inpatient Clinical  Dietitian Contact information available via Amion

## 2020-09-30 NOTE — Progress Notes (Signed)
PROGRESS NOTE    Alexis Molina  EPP:[id number] DOB: [birth year] DOA: 09/28/2020 PCP: Hulan Fess, MD    Brief Narrative:  69 y.o. female with history of hypertension, post ablative hypothyroidism presents to the ER at [facility] with complaints of abdominal pain.  Patient has benign left upper quadrant and epigastric abdominal discomfort for the last 10 days with loose stools.  Patient only has one episode of loose stools every day with no blood in it denies any fever chills vomiting is able to tolerate diet and is able to take her medications.  The abdominal pain is not related to food.  Patient states she has had an EGD last year at [facility] for abdominal discomfort and was told she had a narrow esophagus.  Has had a procedure done for bowel incontinence 2 years ago at [facility] by Dr. Amalia Hailey.  Since the pain was worsening patient came to the ER.  Denies taking any antibiotics recently.  ED Course: In the ER patient was afebrile not hypoxic and Covid test was negative.  Labs are largely unremarkable with creatinine mildly elevated at baseline.  LFTs were normal.  CT abdomen pelvis shows jejunal thickening concerning for infectious versus inflammatory possible ischemic enteritis.   Assessment & Plan:   Principal Problem:   Generalized abdominal pain Active Problems:   Enteritis   Essential hypertension   Hypothyroidism   Abdominal pain  1. Enteritis involving the jejunal area 1. Concerns of inflammatory vs infectious vs ischemic changes 2. Pt's symptoms seem to be improving 3. Per GI, if pt continues to improve, possible d/c in the next couple days with f/u imaging in 2-3 months, however, if pt worsens, then would pursue CT enterography as inpt 4. General Surgery following 2. History of bowel incontinence 1. Pt is s/p implant placed at [facility] 2 years ago, stable 3. Hypertension 1. Pt is continued on PRN IV labetalol for now 2. BP remains well  controlled 4. Hypothyroidism 1. Will resume home thyroid replacement as pt is tolerating diet 5. Chronic renal failure stage II  1. Creatinine appears to be at baseline. 2. recheck bmet in AM 6. Mild hypokalemia 1. Potassium currently within normal limits 2. Repeat bmet in AM  DVT prophylaxis: Heparin subq Code Status: Full Family Communication: Pt in room, family not at bedside  Status is: Inpatient  Remains inpatient appropriate because:IV treatments appropriate due to intensity of illness or inability to take PO and Inpatient level of care appropriate due to severity of illness   Dispo: The patient is from: Home              Anticipated d/c is to: Home              Anticipated d/c date is: > 3 days              Patient currently is not medically stable to d/c.    Consultants:   GI  General Surgery  Procedures:     Antimicrobials: Anti-infectives (From admission, onward)   Start     Dose/Rate Route Frequency Ordered Stop   09/29/20 1200  ciprofloxacin (CIPRO) IVPB 400 mg        400 mg 200 mL/hr over 60 Minutes Intravenous Every 12 hours 09/29/20 0525     09/29/20 0900  metroNIDAZOLE (FLAGYL) IVPB 500 mg        500 mg 100 mL/hr over 60 Minutes Intravenous Every 8 hours 09/29/20 0519  09/28/20 2330  ciprofloxacin (CIPRO) IVPB 400 mg        400 mg 200 mL/hr over 60 Minutes Intravenous  Once 09/28/20 2318 09/29/20 0109   09/28/20 2330  metroNIDAZOLE (FLAGYL) IVPB 500 mg        500 mg 100 mL/hr over 60 Minutes Intravenous  Once 09/28/20 2318 09/29/20 0212      Subjective: Reports feeling better today but does have grimacing and increased pain as pt repositioned herself in bed  Objective: Vitals:   09/29/20 1344 09/29/20 2149 09/30/20 0614 09/30/20 1350  BP: (!) 124/52 (!) 113/51 (!) 111/51 115/74  Pulse: (!) 56 (!) 57 64 60  Resp: 18 18 18 18   Temp: 98.8 F (37.1 C) 98.9 F (37.2 C) 98.7 F (37.1 C) 98.2 F (36.8 C)  TempSrc: Oral Oral Oral Oral    SpO2:  99% 99% 100%  Weight:      Height:        Intake/Output Summary (Last 24 hours) at 09/30/2020 1449 Last data filed at 09/30/2020 1400 Gross per 24 hour  Intake 3059.94 ml  Output 2700 ml  Net 359.94 ml   Filed Weights   09/29/20 0309  Weight: 72.6 kg    Examination: General exam: Awake, laying in bed, in nad Respiratory system: Normal respiratory effort, no wheezing Cardiovascular system: regular rate, s1, s2 Gastrointestinal system: Soft, generally tender, pos BS Central nervous system: CN2-12 grossly intact, strength intact Extremities: Perfused, no clubbing Skin: Normal skin turgor, no notable skin lesions seen Psychiatry: Mood normal // no visual hallucinations   Data Reviewed: I have personally reviewed following labs and imaging studies  CBC: Recent Labs  Lab 09/28/20 1539 09/29/20 0533 09/30/20 0610  WBC 5.2 5.0 3.2*  NEUTROABS  --  3.2  --   HGB 13.0 11.7* 9.9*  HCT 38.4 35.0* 29.2*  MCV 79.7* 80.6 80.9  PLT 259 209 580   Basic Metabolic Panel: Recent Labs  Lab 09/28/20 1539 09/29/20 0533 09/30/20 0610  NA 137 140 140  K 3.0* 4.1 3.6  CL 98 105 107  CO2 27 26 27   GLUCOSE 94 94 117*  BUN 12 10 8   CREATININE 1.02* 1.07* 1.09*  CALCIUM 9.9 9.2 8.2*  MG 1.7  --   --    GFR: Estimated Creatinine Clearance: 46.1 mL/min (A) (by C-G formula based on SCr of 1.09 mg/dL (H)). Liver Function Tests: Recent Labs  Lab 09/28/20 1539 09/29/20 0533 09/30/20 0610  AST 24 19 20   ALT 20 17 15   ALKPHOS 54 49 39  BILITOT 1.1 1.2 0.8  PROT 8.1 6.8 5.5*  ALBUMIN 4.5 3.8 3.1*   Recent Labs  Lab 09/28/20 1539 09/29/20 0533  LIPASE 40 32   No results for input(s): AMMONIA in the last 168 hours. Coagulation Profile: No results for input(s): INR, PROTIME in the last 168 hours. Cardiac Enzymes: No results for input(s): CKTOTAL, CKMB, CKMBINDEX, TROPONINI in the last 168 hours. BNP (last 3 results) No results for input(s): PROBNP in the last 8760  hours. HbA1C: No results for input(s): HGBA1C in the last 72 hours. CBG: Recent Labs  Lab 09/29/20 1135 09/29/20 1759 09/29/20 2316 09/30/20 0609 09/30/20 1155  GLUCAP 72 106* 166* 107* 126*   Lipid Profile: No results for input(s): CHOL, HDL, LDLCALC, TRIG, CHOLHDL, LDLDIRECT in the last 72 hours. Thyroid Function Tests: No results for input(s): TSH, T4TOTAL, FREET4, T3FREE, THYROIDAB in the last 72 hours. Anemia Panel: No results for input(s): VITAMINB12, FOLATE, FERRITIN,  TIBC, IRON, RETICCTPCT in the last 72 hours. Sepsis Labs: Recent Labs  Lab 09/28/20 1842 09/28/20 2038 09/29/20 0533 09/29/20 0737  LATICACIDVEN 1.3 1.2 2.5* 0.8    Recent Results (from the past 240 hour(s))  Urine culture     Status: Abnormal   Collection Time: 09/28/20  3:40 PM   Specimen: Urine, Random  Result Value Ref Range Status   Specimen Description   Final    URINE, RANDOM Performed at [facility], [address] [place], [place] [address]    Special Requests   Final    NONE Performed at [facility], [address] [place], [place] [address]    Culture (A)  Final    <10,000 COLONIES/mL INSIGNIFICANT GROWTH Performed at [facility], [place] [address], [place], [place] [address]    Report Status 09/30/2020 FINAL  Final  Respiratory Panel by RT PCR (Flu A&B, Covid) - Nasopharyngeal Swab     Status: None   Collection Time: 09/29/20 12:10 AM   Specimen: Nasopharyngeal Swab  Result Value Ref Range Status   SARS Coronavirus 2 by RT PCR NEGATIVE NEGATIVE Final    Comment: (NOTE) SARS-CoV-2 target nucleic acids are NOT DETECTED.  The SARS-CoV-2 RNA is generally detectable in upper respiratoy specimens during the acute phase of infection. The lowest concentration of SARS-CoV-2 viral copies this assay can detect is 131 copies/mL. A negative result does not preclude SARS-Cov-2 infection and should not be used as the sole basis for treatment or other patient  management decisions. A negative result may occur with  improper specimen collection/handling, submission of specimen other than nasopharyngeal swab, presence of viral mutation(s) within the areas targeted by this assay, and inadequate number of viral copies (<131 copies/mL). A negative result must be combined with clinical observations, patient history, and epidemiological information. The expected result is Negative.  Fact Sheet for Patients:  PinkCheek.be  Fact Sheet for Healthcare Providers:  GravelBags.it  This test is no t yet approved or cleared by the [place] FDA and  has been authorized for detection and/or diagnosis of SARS-CoV-2 by FDA under an Emergency Use Authorization (EUA). This EUA will remain  in effect (meaning this test can be used) for the duration of the COVID-19 declaration under Section 564(b)(1) of the Act, 21 U.S.C. section 360bbb-3(b)(1), unless the authorization is terminated or revoked sooner.     Influenza A by PCR NEGATIVE NEGATIVE Final   Influenza B by PCR NEGATIVE NEGATIVE Final    Comment: (NOTE) The Xpert Xpress SARS-CoV-2/FLU/RSV assay is intended as an aid in  the diagnosis of influenza from Nasopharyngeal swab specimens and  should not be used as a sole basis for treatment. Nasal washings and  aspirates are unacceptable for Xpert Xpress SARS-CoV-2/FLU/RSV  testing.  Fact Sheet for Patients: PinkCheek.be  Fact Sheet for Healthcare Providers: GravelBags.it  This test is not yet approved or cleared by the [place] FDA and  has been authorized for detection and/or diagnosis of SARS-CoV-2 by  FDA under an Emergency Use Authorization (EUA). This EUA will remain  in effect (meaning this test can be used) for the duration of the  Covid-19 declaration under Section 564(b)(1) of the Act, 21  U.S.C. section 360bbb-3(b)(1),  unless the authorization is  terminated or revoked. Performed at [facility], [address], [place],  [address]      Radiology Studies: CT ABDOMEN PELVIS W CONTRAST  Addendum Date: 09/29/2020  ADDENDUM REPORT: 09/29/2020 08:19 ADDENDUM: Trace amount of free fluid in the pelvis is nonspecific. Differential considerations given that the patient is on an Ace inhibitor could also include angioedema of the small bowel, a rare complication related to Ace inhibitor use. This usually occurs within days of initiating this medication but can occasionally occur after chronic use. Would correlate with any repeated history of abdominal pain without clear explanation. These results were called by telephone at the time of interpretation on 09/29/2020 at 8:18 am to provider Dr. Wyline Copas, Who verbally acknowledged these results. Electronically Signed   By: Zetta Bills M.D.   On: 09/29/2020 08:19   Result Date: 09/29/2020 CLINICAL DATA:  Acute abdominal pain, LEFT-sided abdominal and flank pain, no improvement. EXAM: CT ABDOMEN AND PELVIS WITH CONTRAST TECHNIQUE: Multidetector CT imaging of the abdomen and pelvis was performed using the standard protocol following bolus administration of intravenous contrast. CONTRAST:  169mL OMNIPAQUE IOHEXOL 300 MG/ML  SOLN COMPARISON:  November 01, 2018 FINDINGS: Lower chest: No consolidation.  No pleural effusion. Hepatobiliary: Hepatic hemangioma in the RIGHT hepatic lobe measures 4.3 x 3.3 cm, enlarged from November of 2019 where it measured approximately 3.1 x 2.8 cm. Hepatic hemangioma in the caudate and other small hemangiomata and cysts are stable. Portal vein is patent. Hepatic veins are patent. Post cholecystectomy with mild cholecystectomy biliary duct distension that is not changed. Pancreas: No pancreatic ductal dilation, inflammation or suspicious lesion. Stable intrapancreatic lipoma. Spleen: Spleen normal in size and contour. Adrenals/Urinary  Tract: Adrenal glands are normal. Symmetric renal enhancement. Low-density lesion in the upper pole of the RIGHT kidney compatible with small cyst just at 1 cm. Symmetric renal enhancement. No hydronephrosis or suspicious renal lesion. Urinary bladder is normal. Stomach/Bowel: Marked thickening of a segment of jejunum with perienteric stranding. No signs of pneumatosis. Question poor enhancement of the medial wall of the jejunum (image 29 of series 2) with marked mural stratification involving approximately 5 cm of the jejunum. Mild mesenteric stranding elsewhere in the jejunal mesentery. No ileal thickening. The appendix is normal. The remainder of the small bowel enhances normally. Colon is unremarkable. Vascular/Lymphatic: Calcified and noncalcified atheromatous plaque in the abdominal aorta. SMA is patent. SMV is patent. There is no gastrohepatic or hepatoduodenal ligament lymphadenopathy. No retroperitoneal or mesenteric lymphadenopathy. No pelvic adenopathy Reproductive: Post hysterectomy.  No adnexal mass. Other: No ascites.  No free air. Musculoskeletal: Sacral nerve stimulator, power pack over LEFT hip. No acute musculoskeletal findings. IMPRESSION: 1. Marked thickening of a segment of jejunum with perienteric stranding. Question poor enhancement of the medial wall of the jejunum (image 29 of series 2) marked mural stratification involving approximately 5 cm of the jejunum. Findings may represent infectious or inflammatory enteritis. Given segmental nature and severity ischemia is also considered. Some hypoenhancement is seen along the medial wall though this could be related to edema. Consider correlation with lactate and repeat imaging if symptoms should worsen. 2. Some mild mesenteric elsewhere within the jejunal mesentery though to a lesser extent and without the mural stratification of bowel wall thickening seen in the segment described above. 3. Hepatic hemangiomas and cysts. 4. Aortic  atherosclerosis. These results were called by telephone at the time of interpretation on 09/28/2020 at 6:56 pm to provider [facility] , who verbally acknowledged these results. Aortic Atherosclerosis (ICD10-I70.0). Electronically Signed: By: Zetta Bills M.D. On: 09/28/2020 18:57    Scheduled Meds: . feeding supplement  1 Container Oral TID BM  . feeding supplement  237 mL  Oral BID BM  . heparin  5,000 Units Subcutaneous Q8H  . [START ON 10/02/2020] levothyroxine  37.5 mcg Intravenous Daily  . pantoprazole  40 mg Oral Daily  . rosuvastatin  20 mg Oral q1800   Continuous Infusions: . ciprofloxacin 400 mg (09/30/20 1148)  . dextrose 5 % and 0.9% NaCl 100 mL/hr at 09/29/20 2312  . metronidazole 500 mg (09/30/20 0900)     LOS: 1 day   Marylu Lund, MD [facility] Pager On Amion  If 7PM-7AM, please contact night-coverage 09/30/2020, 2:49 PM

## 2020-09-30 NOTE — Progress Notes (Signed)
Assessment & Plan: HD#3 - Abdominal pain, focal thickening of segment of jejunum             Patient with some right sided abdominal pain this AM  Tolerating clear liquid diet  Discussed findings again with patient.  Etiology unclear.  If continued improvement, would hold off on operative intervention and consider repeat studies in a few weeks.  May need further evaluation of small bowel at some point - endoscopy, capsule endoscopy, CT enterography.  If symptoms persist and diet cannot be advanced, would consider laparoscopy and possible resection this week.  Will follow.  Await GI input today.        Armandina Gemma, MD       [facility]       Office: [phone]   Chief Complaint: Abdominal pain  Subjective: Patient appears comfortable this morning.  Tolerating clear liquids for breakfast.  Objective: Vital signs in last 24 hours: Temp:  [98.7 F (37.1 C)-98.9 F (37.2 C)] 98.7 F (37.1 C) (10/24 0614) Pulse Rate:  [56-64] 64 (10/24 0614) Resp:  [18] 18 (10/24 0614) BP: (110-124)/(51-58) 111/51 (10/24 0614) SpO2:  [99 %-100 %] 99 % (10/24 0614) Last BM Date: 09/27/20  Intake/Output from previous day: 10/23 0701 - 10/24 0700 In: 3284.1 [P.O.:800; I.V.:1784.1; IV Piggyback:700] Out: 2000 [Urine:2000] Intake/Output this shift: No intake/output data recorded.  Physical Exam: HEENT - sclerae clear, mucous membranes moist Neck - soft Chest - clear bilaterally Cor - RRR Abdomen - soft without distension; mild tenderness to palpation LUQ, no mass palpable Ext - no edema, non-tender Neuro - alert & oriented, no focal deficits  Lab Results:  Recent Labs    09/29/20 0533 09/30/20 0610  WBC 5.0 3.2*  HGB 11.7* 9.9*  HCT 35.0* 29.2*  PLT 209 168   BMET Recent Labs    09/29/20 0533 09/30/20 0610  NA 140 140  K 4.1 3.6  CL 105 107  CO2 26 27  GLUCOSE 94 117*  BUN 10 8  CREATININE 1.07* 1.09*  CALCIUM 9.2 8.2*   PT/INR No results for  input(s): LABPROT, INR in the last 72 hours. Comprehensive Metabolic Panel:    Component Value Date/Time   NA 140 09/30/2020 0610   NA 140 09/29/2020 0533   NA 141 01/25/2020 1054   K 3.6 09/30/2020 0610   K 4.1 09/29/2020 0533   CL 107 09/30/2020 0610   CL 105 09/29/2020 0533   CO2 27 09/30/2020 0610   CO2 26 09/29/2020 0533   BUN 8 09/30/2020 0610   BUN 10 09/29/2020 0533   BUN 15 01/25/2020 1054   CREATININE 1.09 (H) 09/30/2020 0610   CREATININE 1.07 (H) 09/29/2020 0533   GLUCOSE 117 (H) 09/30/2020 0610   GLUCOSE 94 09/29/2020 0533   CALCIUM 8.2 (L) 09/30/2020 0610   CALCIUM 9.2 09/29/2020 0533   AST 20 09/30/2020 0610   AST 19 09/29/2020 0533   ALT 15 09/30/2020 0610   ALT 17 09/29/2020 0533   ALKPHOS 39 09/30/2020 0610   ALKPHOS 49 09/29/2020 0533   BILITOT 0.8 09/30/2020 0610   BILITOT 1.2 09/29/2020 0533   PROT 5.5 (L) 09/30/2020 0610   PROT 6.8 09/29/2020 0533   ALBUMIN 3.1 (L) 09/30/2020 0610   ALBUMIN 3.8 09/29/2020 0533    Studies/Results: CT ABDOMEN PELVIS W CONTRAST  Addendum Date: 09/29/2020   ADDENDUM REPORT: 09/29/2020 08:19 ADDENDUM: Trace amount of free fluid in the pelvis is nonspecific. Differential considerations given  that the patient is on an Ace inhibitor could also include angioedema of the small bowel, a rare complication related to Ace inhibitor use. This usually occurs within days of initiating this medication but can occasionally occur after chronic use. Would correlate with any repeated history of abdominal pain without clear explanation. These results were called by telephone at the time of interpretation on 09/29/2020 at 8:18 am to provider Dr. Wyline Copas, Who verbally acknowledged these results. Electronically Signed   By: Zetta Bills M.D.   On: 09/29/2020 08:19   Result Date: 09/29/2020 CLINICAL DATA:  Acute abdominal pain, LEFT-sided abdominal and flank pain, no improvement. EXAM: CT ABDOMEN AND PELVIS WITH CONTRAST TECHNIQUE: Multidetector  CT imaging of the abdomen and pelvis was performed using the standard protocol following bolus administration of intravenous contrast. CONTRAST:  126mL OMNIPAQUE IOHEXOL 300 MG/ML  SOLN COMPARISON:  November 01, 2018 FINDINGS: Lower chest: No consolidation.  No pleural effusion. Hepatobiliary: Hepatic hemangioma in the RIGHT hepatic lobe measures 4.3 x 3.3 cm, enlarged from November of 2019 where it measured approximately 3.1 x 2.8 cm. Hepatic hemangioma in the caudate and other small hemangiomata and cysts are stable. Portal vein is patent. Hepatic veins are patent. Post cholecystectomy with mild cholecystectomy biliary duct distension that is not changed. Pancreas: No pancreatic ductal dilation, inflammation or suspicious lesion. Stable intrapancreatic lipoma. Spleen: Spleen normal in size and contour. Adrenals/Urinary Tract: Adrenal glands are normal. Symmetric renal enhancement. Low-density lesion in the upper pole of the RIGHT kidney compatible with small cyst just at 1 cm. Symmetric renal enhancement. No hydronephrosis or suspicious renal lesion. Urinary bladder is normal. Stomach/Bowel: Marked thickening of a segment of jejunum with perienteric stranding. No signs of pneumatosis. Question poor enhancement of the medial wall of the jejunum (image 29 of series 2) with marked mural stratification involving approximately 5 cm of the jejunum. Mild mesenteric stranding elsewhere in the jejunal mesentery. No ileal thickening. The appendix is normal. The remainder of the small bowel enhances normally. Colon is unremarkable. Vascular/Lymphatic: Calcified and noncalcified atheromatous plaque in the abdominal aorta. SMA is patent. SMV is patent. There is no gastrohepatic or hepatoduodenal ligament lymphadenopathy. No retroperitoneal or mesenteric lymphadenopathy. No pelvic adenopathy Reproductive: Post hysterectomy.  No adnexal mass. Other: No ascites.  No free air. Musculoskeletal: Sacral nerve stimulator, power pack  over LEFT hip. No acute musculoskeletal findings. IMPRESSION: 1. Marked thickening of a segment of jejunum with perienteric stranding. Question poor enhancement of the medial wall of the jejunum (image 29 of series 2) marked mural stratification involving approximately 5 cm of the jejunum. Findings may represent infectious or inflammatory enteritis. Given segmental nature and severity ischemia is also considered. Some hypoenhancement is seen along the medial wall though this could be related to edema. Consider correlation with lactate and repeat imaging if symptoms should worsen. 2. Some mild mesenteric elsewhere within the jejunal mesentery though to a lesser extent and without the mural stratification of bowel wall thickening seen in the segment described above. 3. Hepatic hemangiomas and cysts. 4. Aortic atherosclerosis. These results were called by telephone at the time of interpretation on 09/28/2020 at 6:56 pm to provider [facility] , who verbally acknowledged these results. Aortic Atherosclerosis (ICD10-I70.0). Electronically Signed: By: Zetta Bills M.D. On: 09/28/2020 18:57      Armandina Gemma 09/30/2020  Patient ID: Alexis Molina, female   DOB: Aug 06, 1951, 69 y.o.   MRN: [id number]

## 2020-10-01 ENCOUNTER — Inpatient Hospital Stay (HOSPITAL_COMMUNITY): Payer: Medicare HMO

## 2020-10-01 ENCOUNTER — Encounter (HOSPITAL_COMMUNITY): Payer: Self-pay | Admitting: Internal Medicine

## 2020-10-01 DIAGNOSIS — I10 Essential (primary) hypertension: Secondary | ICD-10-CM | POA: Diagnosis not present

## 2020-10-01 DIAGNOSIS — R1084 Generalized abdominal pain: Secondary | ICD-10-CM | POA: Diagnosis not present

## 2020-10-01 LAB — COMPREHENSIVE METABOLIC PANEL
ALT: 15 U/L (ref 0–44)
AST: 18 U/L (ref 15–41)
Albumin: 3 g/dL — ABNORMAL LOW (ref 3.5–5.0)
Alkaline Phosphatase: 39 U/L (ref 38–126)
Anion gap: 7 (ref 5–15)
BUN: 6 mg/dL — ABNORMAL LOW (ref 8–23)
CO2: 26 mmol/L (ref 22–32)
Calcium: 8.2 mg/dL — ABNORMAL LOW (ref 8.9–10.3)
Chloride: 110 mmol/L (ref 98–111)
Creatinine, Ser: 1.07 mg/dL — ABNORMAL HIGH (ref 0.44–1.00)
GFR, Estimated: 57 mL/min — ABNORMAL LOW (ref >60)
Glucose, Bld: 116 mg/dL — ABNORMAL HIGH (ref 70–99)
Potassium: 4 mmol/L (ref 3.5–5.1)
Sodium: 143 mmol/L (ref 135–145)
Total Bilirubin: 0.8 mg/dL (ref 0.3–1.2)
Total Protein: 5.4 g/dL — ABNORMAL LOW (ref 6.5–8.1)

## 2020-10-01 LAB — CBC
HCT: 30.4 % — ABNORMAL LOW (ref 36.0–46.0)
Hemoglobin: 10.1 g/dL — ABNORMAL LOW (ref 12.0–15.0)
MCH: 27.3 pg (ref 26.0–34.0)
MCHC: 33.2 g/dL (ref 30.0–36.0)
MCV: 82.2 fL (ref 80.0–100.0)
Platelets: 192 10*3/uL (ref 150–400)
RBC: 3.7 MIL/uL — ABNORMAL LOW (ref 3.87–5.11)
RDW: 14.4 % (ref 11.5–15.5)
WBC: 3.6 10*3/uL — ABNORMAL LOW (ref 4.0–10.5)
nRBC: 0 % (ref 0.0–0.2)

## 2020-10-01 LAB — SEDIMENTATION RATE: Sed Rate: 7 mm/hr (ref 0–22)

## 2020-10-01 LAB — GLUCOSE, CAPILLARY
Glucose-Capillary: 120 mg/dL — ABNORMAL HIGH (ref 70–99)
Glucose-Capillary: 120 mg/dL — ABNORMAL HIGH (ref 70–99)
Glucose-Capillary: 96 mg/dL (ref 70–99)

## 2020-10-01 MED ORDER — BARIUM SULFATE 0.1 % PO SUSP
ORAL | Status: AC
  Filled 2020-10-01: qty 3

## 2020-10-01 MED ORDER — IOHEXOL 300 MG/ML  SOLN
100.0000 mL | Freq: Once | INTRAMUSCULAR | Status: AC | PRN
  Administered 2020-10-01: 15:00:00 100 mL via INTRAVENOUS

## 2020-10-01 MED ORDER — PANTOPRAZOLE SODIUM 40 MG IV SOLR
40.0000 mg | Freq: Two times a day (BID) | INTRAVENOUS | Status: DC
  Administered 2020-10-01 – 2020-10-02 (×3): 40 mg via INTRAVENOUS
  Filled 2020-10-01 (×3): qty 40

## 2020-10-01 NOTE — Progress Notes (Signed)
Chaplain engaged in initial visit with Alexis Molina.  During visit, Alexis Molina shared that her husband passed in September.  Chaplain and Alexis Molina spent their visit discussing him, how they met, and who he was to her and their family.  Alexis Molina held a lot of reverence for his wife and the women in his life.  Alexis Molina declared that he was a real man, provider, and father.  He taught his sons how to really care for their families.  They had been married over 59 years, getting married in college after meeting in grade school.  Alexis Molina detailed it was kind of love at first sight for him.  Her husband was a man who did a lot for others and she detailed that she could tell he was tired.  Chaplain listened as Alexis Molina described her husband and the life they shared, and celebrated their love for each other.     Alexis Molina also shared that she has often been the one caring for others in her family.  When others were not able to offer the support of a caregiver she was the one who had to take up that role in caring for her parents and aunts and uncles over the years.  She has always kind of been the go-to person when others became ill or needed extra help.  She even got a job so that she could pay the bills of those that she was taking care of daily.    Alexis Molina seems to have a lot of support from her children and church family.  She noted that people are always checking on her.  She also feels well supported and loved by staff.  She called the nurses "angels."  Chaplain affirmed that her spirit invites others to care for her.    Chaplain assesses that Alexis Molina is still in a great period of grief concerning her husband.  She has a lot of faith and depends of God to get her through and it is also helpful to have those who will pray with her and listen.  It is good for her to have someone around as she not only undergoes health challenges but still processes her husband's death.  Chaplain will continue to follow-up and offer  support.     10/01/20 1200  Clinical Encounter Type  Visited With Patient  Visit Type Initial  Referral From Patient  Consult/Referral To Chaplain  Spiritual Encounters  Spiritual Needs Grief support;Prayer  Stress Factors  Patient Stress Factors Major life changes

## 2020-10-01 NOTE — Progress Notes (Signed)
[facility] Progress Note     Subjective: CC-  States that she is still having some intermittent sharp LUQ abdominal pains, worse with movement. These are less severe and less frequent than 24 hours ago. Some nausea yesterday after eating pudding, nausea resolved today. No emesis. Otherwise tolerating full liquids. Last BM 4 days ago (at baseline she only has 1 BM per week). No flatus. Complaining of some new gas pains in the RUQ.  WBC 3.6, afebrile  Objective: Vital signs in last 24 hours: Temp:  [98.2 F (36.8 C)-98.7 F (37.1 C)] 98.2 F (36.8 C) (10/25 0732) Pulse Rate:  [54-60] 54 (10/25 0732) Resp:  [16-18] 16 (10/25 0732) BP: (115-131)/(57-74) 126/57 (10/25 0732) SpO2:  [98 %-100 %] 98 % (10/25 0732) Last BM Date: 09/30/20  Intake/Output from previous day: 10/24 0701 - 10/25 0700 In: 2686.2 [P.O.:1560; I.V.:426.2; IV Piggyback:700] Out: 4200 [Urine:4200] Intake/Output this shift: No intake/output data recorded.  PE: Gen:  Alert, NAD, pleasant HEENT: EOM's intact, pupils equal and round Card:  RRR Pulm:  CTAB, no W/R/R, rate and effort normal Abd: Soft, nondistended, +BS, no HSM, mild LUQ TTP  Psych: A&Ox4  Neuro: no focal deficits Skin: no rashes noted, warm and dry  Lab Results:  Recent Labs    09/30/20 0610 10/01/20 0505  WBC 3.2* 3.6*  HGB 9.9* 10.1*  HCT 29.2* 30.4*  PLT 168 192   BMET Recent Labs    09/30/20 0610 10/01/20 0505  NA 140 143  K 3.6 4.0  CL 107 110  CO2 27 26  GLUCOSE 117* 116*  BUN 8 6*  CREATININE 1.09* 1.07*  CALCIUM 8.2* 8.2*   PT/INR No results for input(s): LABPROT, INR in the last 72 hours. CMP     Component Value Date/Time   NA 143 10/01/2020 0505   NA 141 01/25/2020 1054   K 4.0 10/01/2020 0505   CL 110 10/01/2020 0505   CO2 26 10/01/2020 0505   GLUCOSE 116 (H) 10/01/2020 0505   BUN 6 (L) 10/01/2020 0505   BUN 15 01/25/2020 1054   CREATININE 1.07 (H) 10/01/2020 0505   CALCIUM 8.2 (L) 10/01/2020  0505   PROT 5.4 (L) 10/01/2020 0505   ALBUMIN 3.0 (L) 10/01/2020 0505   AST 18 10/01/2020 0505   ALT 15 10/01/2020 0505   ALKPHOS 39 10/01/2020 0505   BILITOT 0.8 10/01/2020 0505   GFRNONAA 57 (L) 10/01/2020 0505   GFRAA 63 01/25/2020 1054   Lipase     Component Value Date/Time   LIPASE 32 09/29/2020 0533       Studies/Results: No results found.  Anti-infectives: Anti-infectives (From admission, onward)   Start     Dose/Rate Route Frequency Ordered Stop   09/29/20 1200  ciprofloxacin (CIPRO) IVPB 400 mg        400 mg 200 mL/hr over 60 Minutes Intravenous Every 12 hours 09/29/20 0525     09/29/20 0900  metroNIDAZOLE (FLAGYL) IVPB 500 mg        500 mg 100 mL/hr over 60 Minutes Intravenous Every 8 hours 09/29/20 0519     09/28/20 2330  ciprofloxacin (CIPRO) IVPB 400 mg        400 mg 200 mL/hr over 60 Minutes Intravenous  Once 09/28/20 2318 09/29/20 0109   09/28/20 2330  metroNIDAZOLE (FLAGYL) IVPB 500 mg        500 mg 100 mL/hr over 60 Minutes Intravenous  Once 09/28/20 2318 09/29/20 7591       Assessment/Plan  HTN Hypothyroidism CKD  Abdominal pain, focal thickening of segment of jejunum - etiology unclear - Patient still has some left sided abdominal pain today, but overall she continues to report improvement in symptoms. Continue current plan of care for now. If symptoms persist/ stop improving then we may need to consider CT enterography vs laparoscopy.  ID - cipro/flagyl 10/22>>day#3 FEN - IVF, FLD VTE - sq heparin Foley - none Follow up - TBD   LOS: 2 days    Wellington Hampshire, [facility] 10/01/2020, 8:53 AM Please see Amion for pager number during day hours 7:00am-4:30pm

## 2020-10-01 NOTE — Progress Notes (Signed)
[facility] Gastroenterology Progress Note  Alexis Molina 69 y.o. [birth year]  CC:  Abdominal pain, jejunal thickening on CT  Subjective: Patient is tearful, states she continues to have abdominal pain and does not feel improved. States she has not had a bowel movement since last Thursday 10/21.  Continues to have flatus.  No current nausea or vomiting.  She is tolerating a full liquid diet.  ROS : Review of Systems  Cardiovascular: Negative for chest pain and palpitations.  Gastrointestinal: Positive for abdominal pain and constipation. Negative for blood in stool, diarrhea, heartburn, melena, nausea and vomiting.   Objective: Vital signs in last 24 hours: Vitals:   10/01/20 0457 10/01/20 0732  BP: 118/64 (!) 126/57  Pulse: (!) 56 (!) 54  Resp: 16 16  Temp: 98.5 F (36.9 C) 98.2 F (36.8 C)  SpO2: 100% 98%    Physical Exam:  General:  Tearful, anxious, cooperative  Head:  Normocephalic, without obvious abnormality, atraumatic  Eyes:  Anicteric sclera, EOMs intact  Lungs:   Clear to auscultation bilaterally, respirations unlabored  Heart:  Regular rate and rhythm, S1, S2 normal  Abdomen:   Soft with mild to moderate LUQ tenderness to palpation, bowel sounds active all four quadrants,  no peritoneal signs   Extremities: Extremities normal, atraumatic, no  edema  Pulses: 2+ and symmetric    Lab Results: Recent Labs    09/28/20 1539 09/29/20 0533 09/30/20 0610 10/01/20 0505  NA 137   < > 140 143  K 3.0*   < > 3.6 4.0  CL 98   < > 107 110  CO2 27   < > 27 26  GLUCOSE 94   < > 117* 116*  BUN 12   < > 8 6*  CREATININE 1.02*   < > 1.09* 1.07*  CALCIUM 9.9   < > 8.2* 8.2*  MG 1.7  --   --   --    < > = values in this interval not displayed.   Recent Labs    09/30/20 0610 10/01/20 0505  AST 20 18  ALT 15 15  ALKPHOS 39 39  BILITOT 0.8 0.8  PROT 5.5* 5.4*  ALBUMIN 3.1* 3.0*   Recent Labs    09/29/20 0533 09/29/20 0533 09/30/20 0610 10/01/20 0505  WBC 5.0   <  > 3.2* 3.6*  NEUTROABS 3.2  --   --   --   HGB 11.7*   < > 9.9* 10.1*  HCT 35.0*   < > 29.2* 30.4*  MCV 80.6   < > 80.9 82.2  PLT 209   < > 168 192   < > = values in this interval not displayed.   No results for input(s): LABPROT, INR in the last 72 hours.    Assessment Abdominal pain, jejunal thickening on CT -CT 10/22: Marked thickening of a segment of jejunum with perienteric stranding.  -WBCs 10.1, improved from 11.7 as of 10/23 -On Cipro/Flagyl  Plan: Continue Cipro/Flagyl.  Continue full liquid diet.  Consider CT-enterography.  [facility] will follow.  Salley Slaughter PA-C 10/01/2020, 11:08 AM  Contact #  [phone]

## 2020-10-01 NOTE — Progress Notes (Signed)
PROGRESS NOTE    Alexis Molina  EHO:[id number] DOB: 05/01/1951 DOA: 09/28/2020 PCP: Hulan Fess, MD    Brief Narrative:  69 y.o. female with history of hypertension, post ablative hypothyroidism presents to the ER at [facility] with complaints of abdominal pain.  Patient has benign left upper quadrant and epigastric abdominal discomfort for the last 10 days with loose stools.  Patient only has one episode of loose stools every day with no blood in it denies any fever chills vomiting is able to tolerate diet and is able to take her medications.  The abdominal pain is not related to food.  Patient states she has had an EGD last year at [facility] for abdominal discomfort and was told she had a narrow esophagus.  Has had a procedure done for bowel incontinence 2 years ago at [facility] by Dr. Amalia Hailey.  Since the pain was worsening patient came to the ER.  Denies taking any antibiotics recently.  ED Course: In the ER patient was afebrile not hypoxic and Covid test was negative.  Labs are largely unremarkable with creatinine mildly elevated at baseline.  LFTs were normal.  CT abdomen pelvis shows jejunal thickening concerning for infectious versus inflammatory possible ischemic enteritis.   Assessment & Plan:   Principal Problem:   Generalized abdominal pain Active Problems:   Enteritis   Essential hypertension   Hypothyroidism   Abdominal pain  1. Enteritis involving the jejunal area 1. Concerns of inflammatory vs infectious vs ischemic changes 2. General Surgery following 3. Pt continues to complain of marked pain on movement with nausea. Not tolerating advanced diet 4. GI recommending checking ESR, CRP and CT enterography with continued abx 2. History of bowel incontinence 1. Pt is s/p implant placed at [facility] 2 years ago, stable 3. Hypertension 1. Pt is continued on PRN IV labetalol for now 2. BP remains stable at this  time 4. Hypothyroidism 1. Continued on home synthroid 5. Chronic renal failure stage II  1. Creatinine appears stable this AM 2. Repeat bmet in AM 6. Mild hypokalemia 1. Potassium currently within normal limits 2. Recheck bmet in AM  DVT prophylaxis: Heparin subq Code Status: Full Family Communication: Pt in room, family not at bedside  Status is: Inpatient  Remains inpatient appropriate because:IV treatments appropriate due to intensity of illness or inability to take PO and Inpatient level of care appropriate due to severity of illness   Dispo: The patient is from: Home              Anticipated d/c is to: Home              Anticipated d/c date is: > 3 days              Patient currently is not medically stable to d/c.    Consultants:   GI  General Surgery  Procedures:     Antimicrobials: Anti-infectives (From admission, onward)   Start     Dose/Rate Route Frequency Ordered Stop   09/29/20 1200  ciprofloxacin (CIPRO) IVPB 400 mg        400 mg 200 mL/hr over 60 Minutes Intravenous Every 12 hours 09/29/20 0525     09/29/20 0900  metroNIDAZOLE (FLAGYL) IVPB 500 mg        500 mg 100 mL/hr over 60 Minutes Intravenous Every 8 hours 09/29/20 0519     09/28/20 2330  ciprofloxacin (CIPRO) IVPB 400 mg  400 mg 200 mL/hr over 60 Minutes Intravenous  Once 09/28/20 2318 09/29/20 0109   09/28/20 2330  metroNIDAZOLE (FLAGYL) IVPB 500 mg        500 mg 100 mL/hr over 60 Minutes Intravenous  Once 09/28/20 2318 09/29/20 0212      Subjective: Initially reports feeling well at rest, however on minimal movement in bed, pt reports marked abd pain  Objective: Vitals:   09/30/20 2056 10/01/20 0457 10/01/20 0732 10/01/20 1227  BP: 131/60 118/64 (!) 126/57 134/64  Pulse: (!) 57 (!) 56 (!) 54 (!) 57  Resp: '18 16 16 18  ' Temp: 98.7 F (37.1 C) 98.5 F (36.9 C) 98.2 F (36.8 C) 97.9 F (36.6 C)  TempSrc: Oral Oral Oral Oral  SpO2: 99% 100% 98% 99%  Weight:      Height:         Intake/Output Summary (Last 24 hours) at 10/01/2020 1542 Last data filed at 10/01/2020 1510 Gross per 24 hour  Intake 2578.37 ml  Output 2850 ml  Net -271.63 ml   Filed Weights   09/29/20 0309  Weight: 72.6 kg    Examination: General exam: Conversant, in no acute distress Respiratory system: normal chest rise, clear, no audible wheezing Cardiovascular system: regular rhythm, s1-s2 Gastrointestinal system: Nondistended, generally tender, pos BS Central nervous system: No seizures, no tremors Extremities: No cyanosis, no joint deformities Skin: No rashes, no pallor Psychiatry: Affect normal // no auditory hallucinations    Data Reviewed: I have personally reviewed following labs and imaging studies  CBC: Recent Labs  Lab 09/28/20 1539 09/29/20 0533 09/30/20 0610 10/01/20 0505  WBC 5.2 5.0 3.2* 3.6*  NEUTROABS  --  3.2  --   --   HGB 13.0 11.7* 9.9* 10.1*  HCT 38.4 35.0* 29.2* 30.4*  MCV 79.7* 80.6 80.9 82.2  PLT 259 209 168 542   Basic Metabolic Panel: Recent Labs  Lab 09/28/20 1539 09/29/20 0533 09/30/20 0610 10/01/20 0505  NA 137 140 140 143  K 3.0* 4.1 3.6 4.0  CL 98 105 107 110  CO2 '27 26 27 26  ' GLUCOSE 94 94 117* 116*  BUN '12 10 8 ' 6*  CREATININE 1.02* 1.07* 1.09* 1.07*  CALCIUM 9.9 9.2 8.2* 8.2*  MG 1.7  --   --   --    GFR: Estimated Creatinine Clearance: 46.9 mL/min (A) (by C-G formula based on SCr of 1.07 mg/dL (H)). Liver Function Tests: Recent Labs  Lab 09/28/20 1539 09/29/20 0533 09/30/20 0610 10/01/20 0505  AST '24 19 20 18  ' ALT '20 17 15 15  ' ALKPHOS 54 49 39 39  BILITOT 1.1 1.2 0.8 0.8  PROT 8.1 6.8 5.5* 5.4*  ALBUMIN 4.5 3.8 3.1* 3.0*   Recent Labs  Lab 09/28/20 1539 09/29/20 0533  LIPASE 40 32   No results for input(s): AMMONIA in the last 168 hours. Coagulation Profile: No results for input(s): INR, PROTIME in the last 168 hours. Cardiac Enzymes: No results for input(s): CKTOTAL, CKMB, CKMBINDEX, TROPONINI in the  last 168 hours. BNP (last 3 results) No results for input(s): PROBNP in the last 8760 hours. HbA1C: No results for input(s): HGBA1C in the last 72 hours. CBG: Recent Labs  Lab 09/30/20 1155 09/30/20 1729 09/30/20 2341 10/01/20 0703 10/01/20 1208  GLUCAP 126* 113* 200* 120* 120*   Lipid Profile: No results for input(s): CHOL, HDL, LDLCALC, TRIG, CHOLHDL, LDLDIRECT in the last 72 hours. Thyroid Function Tests: No results for input(s): TSH, T4TOTAL, FREET4, T3FREE, THYROIDAB in  the last 72 hours. Anemia Panel: No results for input(s): VITAMINB12, FOLATE, FERRITIN, TIBC, IRON, RETICCTPCT in the last 72 hours. Sepsis Labs: Recent Labs  Lab 09/28/20 1842 09/28/20 2038 09/29/20 0533 09/29/20 0737  LATICACIDVEN 1.3 1.2 2.5* 0.8    Recent Results (from the past 240 hour(s))  Urine culture     Status: Abnormal   Collection Time: 09/28/20  3:40 PM   Specimen: Urine, Random  Result Value Ref Range Status   Specimen Description   Final    URINE, RANDOM Performed at [facility], [address] [place], [place] [address]    Special Requests   Final    NONE Performed at [facility], [address] [place], [place] [address]    Culture (A)  Final    <10,000 COLONIES/mL INSIGNIFICANT GROWTH Performed at [facility], [place] [address], [place], [place] [address]    Report Status 09/30/2020 FINAL  Final  Respiratory Panel by RT PCR (Flu A&B, Covid) - Nasopharyngeal Swab     Status: None   Collection Time: 09/29/20 12:10 AM   Specimen: Nasopharyngeal Swab  Result Value Ref Range Status   SARS Coronavirus 2 by RT PCR NEGATIVE NEGATIVE Final    Comment: (NOTE) SARS-CoV-2 target nucleic acids are NOT DETECTED.  The SARS-CoV-2 RNA is generally detectable in upper respiratoy specimens during the acute phase of infection. The lowest concentration of SARS-CoV-2 viral copies this assay can detect is 131 copies/mL. A negative result does not preclude  SARS-Cov-2 infection and should not be used as the sole basis for treatment or other patient management decisions. A negative result may occur with  improper specimen collection/handling, submission of specimen other than nasopharyngeal swab, presence of viral mutation(s) within the areas targeted by this assay, and inadequate number of viral copies (<131 copies/mL). A negative result must be combined with clinical observations, patient history, and epidemiological information. The expected result is Negative.  Fact Sheet for Patients:  PinkCheek.be  Fact Sheet for Healthcare Providers:  GravelBags.it  This test is no t yet approved or cleared by the [place] FDA and  has been authorized for detection and/or diagnosis of SARS-CoV-2 by FDA under an Emergency Use Authorization (EUA). This EUA will remain  in effect (meaning this test can be used) for the duration of the COVID-19 declaration under Section 564(b)(1) of the Act, 21 U.S.C. section 360bbb-3(b)(1), unless the authorization is terminated or revoked sooner.     Influenza A by PCR NEGATIVE NEGATIVE Final   Influenza B by PCR NEGATIVE NEGATIVE Final    Comment: (NOTE) The Xpert Xpress SARS-CoV-2/FLU/RSV assay is intended as an aid in  the diagnosis of influenza from Nasopharyngeal swab specimens and  should not be used as a sole basis for treatment. Nasal washings and  aspirates are unacceptable for Xpert Xpress SARS-CoV-2/FLU/RSV  testing.  Fact Sheet for Patients: PinkCheek.be  Fact Sheet for Healthcare Providers: GravelBags.it  This test is not yet approved or cleared by the [place] FDA and  has been authorized for detection and/or diagnosis of SARS-CoV-2 by  FDA under an Emergency Use Authorization (EUA). This EUA will remain  in effect (meaning this test can be used) for the duration of the   Covid-19 declaration under Section 564(b)(1) of the Act, 21  U.S.C. section 360bbb-3(b)(1), unless the authorization is  terminated or revoked. Performed at [facility], [address] [place], [place] [address]  Radiology Studies: No results found.  Scheduled Meds: . feeding supplement  1 Container Oral TID BM  . feeding supplement  237 mL Oral BID BM  . heparin  5,000 Units Subcutaneous Q8H  . levothyroxine  50 mcg Oral Daily  . pantoprazole  40 mg Oral Daily  . rosuvastatin  20 mg Oral q1800   Continuous Infusions: . ciprofloxacin 400 mg (10/01/20 1222)  . dextrose 5 % and 0.9% NaCl 100 mL/hr at 10/01/20 1217  . metronidazole 500 mg (10/01/20 0841)     LOS: 2 days   Marylu Lund, MD [facility] Pager On Amion  If 7PM-7AM, please contact night-coverage 10/01/2020, 3:42 PM

## 2020-10-01 NOTE — H&P (View-Only) (Signed)
Called patient and discussed Ct-enterography findings of gastritis/duodenitis. Recommend EGD for further evaluation. I thoroughly discussed the procedure with the patient to include nature, alternatives, benefits, and risks (including but not limited to bleeding, infection, perforation, anesthesia / cardiac and pulmonary complications). Patient verbalized understanding and gave verbal consent to proceed with EGD.   EGD tomorrow. N PO after midnight.   Salley Slaughter, PA-C [facility] Gastroenterology

## 2020-10-01 NOTE — Progress Notes (Signed)
Called patient and discussed Ct-enterography findings of gastritis/duodenitis. Recommend EGD for further evaluation. I thoroughly discussed the procedure with the patient to include nature, alternatives, benefits, and risks (including but not limited to bleeding, infection, perforation, anesthesia / cardiac and pulmonary complications). Patient verbalized understanding and gave verbal consent to proceed with EGD.   EGD tomorrow. N PO after midnight.   Salley Slaughter, PA-C [facility] Gastroenterology

## 2020-10-02 ENCOUNTER — Inpatient Hospital Stay (HOSPITAL_COMMUNITY): Payer: Medicare HMO | Admitting: Certified Registered Nurse Anesthetist

## 2020-10-02 ENCOUNTER — Encounter (HOSPITAL_COMMUNITY)
Admission: EM | Disposition: A | Discharge status: Home / Self Care | Payer: Self-pay | Source: Home / Self Care | Attending: Internal Medicine

## 2020-10-02 ENCOUNTER — Encounter (HOSPITAL_COMMUNITY): Payer: Self-pay | Admitting: Internal Medicine

## 2020-10-02 DIAGNOSIS — K529 Noninfective gastroenteritis and colitis, unspecified: Secondary | ICD-10-CM | POA: Diagnosis not present

## 2020-10-02 DIAGNOSIS — R1084 Generalized abdominal pain: Secondary | ICD-10-CM | POA: Diagnosis not present

## 2020-10-02 DIAGNOSIS — I10 Essential (primary) hypertension: Secondary | ICD-10-CM | POA: Diagnosis not present

## 2020-10-02 HISTORY — PX: ESOPHAGOGASTRODUODENOSCOPY (EGD) WITH PROPOFOL: SHX5813

## 2020-10-02 HISTORY — PX: BIOPSY: SHX5522

## 2020-10-02 LAB — COMPREHENSIVE METABOLIC PANEL
ALT: 19 U/L (ref 0–44)
AST: 27 U/L (ref 15–41)
Albumin: 2.9 g/dL — ABNORMAL LOW (ref 3.5–5.0)
Alkaline Phosphatase: 41 U/L (ref 38–126)
Anion gap: 6 (ref 5–15)
BUN: <5 mg/dL — ABNORMAL LOW (ref 8–23)
CO2: 24 mmol/L (ref 22–32)
Calcium: 7.9 mg/dL — ABNORMAL LOW (ref 8.9–10.3)
Chloride: 111 mmol/L (ref 98–111)
Creatinine, Ser: 0.99 mg/dL (ref 0.44–1.00)
GFR, Estimated: >60 mL/min (ref >60)
Glucose, Bld: 123 mg/dL — ABNORMAL HIGH (ref 70–99)
Potassium: 3.1 mmol/L — ABNORMAL LOW (ref 3.5–5.1)
Sodium: 141 mmol/L (ref 135–145)
Total Bilirubin: 0.6 mg/dL (ref 0.3–1.2)
Total Protein: 5.3 g/dL — ABNORMAL LOW (ref 6.5–8.1)

## 2020-10-02 LAB — CBC
HCT: 28.6 % — ABNORMAL LOW (ref 36.0–46.0)
Hemoglobin: 9.7 g/dL — ABNORMAL LOW (ref 12.0–15.0)
MCH: 27.2 pg (ref 26.0–34.0)
MCHC: 33.9 g/dL (ref 30.0–36.0)
MCV: 80.1 fL (ref 80.0–100.0)
Platelets: 185 10*3/uL (ref 150–400)
RBC: 3.57 MIL/uL — ABNORMAL LOW (ref 3.87–5.11)
RDW: 14.5 % (ref 11.5–15.5)
WBC: 4 10*3/uL (ref 4.0–10.5)
nRBC: 0 % (ref 0.0–0.2)

## 2020-10-02 LAB — GLUCOSE, CAPILLARY
Glucose-Capillary: 113 mg/dL — ABNORMAL HIGH (ref 70–99)
Glucose-Capillary: 124 mg/dL — ABNORMAL HIGH (ref 70–99)
Glucose-Capillary: 151 mg/dL — ABNORMAL HIGH (ref 70–99)
Glucose-Capillary: 179 mg/dL — ABNORMAL HIGH (ref 70–99)
Glucose-Capillary: 98 mg/dL (ref 70–99)

## 2020-10-02 LAB — HIGH SENSITIVITY CRP: CRP, High Sensitivity: 0.38 mg/L (ref 0.00–3.00)

## 2020-10-02 SURGERY — ESOPHAGOGASTRODUODENOSCOPY (EGD) WITH PROPOFOL
Anesthesia: Monitor Anesthesia Care

## 2020-10-02 MED ORDER — LACTATED RINGERS IV SOLN: INTRAVENOUS | Status: DC

## 2020-10-02 MED ORDER — PROPOFOL 1000 MG/100ML IV EMUL
INTRAVENOUS | Status: AC
  Filled 2020-10-02: qty 100

## 2020-10-02 MED ORDER — PROPOFOL 10 MG/ML IV BOLUS
INTRAVENOUS | Status: DC | PRN
  Administered 2020-10-02 (×2): 30 mg via INTRAVENOUS

## 2020-10-02 MED ORDER — LISINOPRIL 20 MG PO TABS
20.0000 mg | ORAL_TABLET | Freq: Every day | ORAL | Status: DC
  Administered 2020-10-02 – 2020-10-03 (×2): 20 mg via ORAL
  Filled 2020-10-02: qty 1

## 2020-10-02 MED ORDER — PROPOFOL 500 MG/50ML IV EMUL
INTRAVENOUS | Status: DC | PRN
  Administered 2020-10-02: 150 ug/kg/min via INTRAVENOUS

## 2020-10-02 MED ORDER — POTASSIUM CHLORIDE 10 MEQ/100ML IV SOLN
10.0000 meq | INTRAVENOUS | Status: AC
  Administered 2020-10-02 (×3): 10 meq via INTRAVENOUS
  Filled 2020-10-02: qty 100

## 2020-10-02 SURGICAL SUPPLY — 15 items

## 2020-10-02 NOTE — Op Note (Signed)
[facility] Patient Name: Alexis Molina Procedure Date: 10/02/2020 MRN: [id number] Attending MD: Lear Ng , MD Date of Birth: [birth year] CSN: [id number] Age: 69 Admit Type: Inpatient Procedure:                Upper GI endoscopy Indications:              Generalized abdominal pain, Abnormal CT of the GI                            tract Providers:                Lear Ng, MD, Elspeth Cho Tech.,                            Technician, Lesia Sago, Technician, Ron                            [place], Cleda Daub, RN Referring MD:             hospital team Medicines:                Propofol per Anesthesia, Monitored Anesthesia Care Complications:            No immediate complications. Estimated Blood Loss:     Estimated blood loss was minimal. Procedure:                Pre-Anesthesia Assessment:                           - Prior to the procedure, a History and Physical                            was performed, and patient medications and                            allergies were reviewed. The patient's tolerance of                            previous anesthesia was also reviewed. The risks                            and benefits of the procedure and the sedation                            options and risks were discussed with the patient.                            All questions were answered, and informed consent                            was obtained. Prior Anticoagulants: The patient has                            taken no previous anticoagulant or antiplatelet  agents. ASA Grade Assessment: III - A patient with                            severe systemic disease. After reviewing the risks                            and benefits, the patient was deemed in                            satisfactory condition to undergo the procedure.                           After obtaining informed consent, the endoscope was                             passed under direct vision. Throughout the                            procedure, the patient's blood pressure, pulse, and                            oxygen saturations were monitored continuously. The                            GIF-H190 ([id number]) Olympus gastroscope was                            introduced through the mouth, and advanced to the                            second part of duodenum. The upper GI endoscopy was                            accomplished without difficulty. The patient                            tolerated the procedure well. Scope In: Scope Out: Findings:      The examined esophagus was normal.      The Z-line was regular and was found 40 cm from the incisors.      Segmental mild inflammation characterized by congestion (edema) and       erythema was found in the gastric antrum. Biopsies were taken with a       cold forceps for histology. Estimated blood loss was minimal.      The cardia and gastric fundus were normal on retroflexion.      The exam of the stomach was otherwise normal.      The examined duodenum was normal. Impression:               - Normal esophagus.                           - Z-line regular, 40 cm from the incisors.                           -  Acute gastritis. Biopsied.                           - Normal examined duodenum. Moderate Sedation:      Not Applicable - Patient had care per Anesthesia. Recommendation:           - Advance diet as tolerated and clear liquid diet.                           - Await pathology results. Procedure Code(s):        --- Professional ---                           [id number], Esophagogastroduodenoscopy, flexible,                            transoral; with biopsy, single or multiple Diagnosis Code(s):        --- Professional ---                           R10.84, Generalized abdominal pain                           K29.00, Acute gastritis without bleeding                           R93.3, Abnormal  findings on diagnostic imaging of                            other parts of digestive tract CPT copyright 2019 American Medical Association. All rights reserved. The codes documented in this report are preliminary and upon coder review may  be revised to meet current compliance requirements. Lear Ng, MD 10/02/2020 12:14:42 PM This report has been signed electronically. Number of Addenda: 0

## 2020-10-02 NOTE — Progress Notes (Signed)
Pharmacy Antibiotic Note  Alexis Molina is a 69 y.o. female admitted on 09/28/2020 with with history of hypertension, post ablative hypothyroidism presents to the ER at [facility] with complaints of abdominal pain and IAI.  Pharmacy has been consulted for cipro dosing.   Today, 10/02/20  Day 4 Cipro/flagyl IV  Afebrile  WBC stable  SCr stable   Plan: Continue Cipro 400mg  IV q12h per renal function After EGD if patient tolerating orals, recommend changing Cipro/flagyl to PO  Height: 5\' 2"  (157.5 cm) Weight: 72.6 kg (160 lb 1.6 oz) IBW/kg (Calculated) : 50.1  Temp (24hrs), Avg:98 F (36.7 C), Min:97.4 F (36.3 C), Max:98.8 F (37.1 C)  Recent Labs  Lab 09/28/20 1539 09/28/20 1842 09/28/20 2038 09/29/20 0533 09/29/20 0737 09/30/20 0610 10/01/20 0505 10/02/20 0437  WBC 5.2  --   --  5.0  --  3.2* 3.6* 4.0  CREATININE 1.02*  --   --  1.07*  --  1.09* 1.07* 0.99  LATICACIDVEN  --  1.3 1.2 2.5* 0.8  --   --   --     Estimated Creatinine Clearance: 50.7 mL/min (by C-G formula based on SCr of 0.99 mg/dL).    Allergies  Allergen Reactions  . Darvocet [Propoxyphene N-Acetaminophen] Other (See Comments)    Asthma attack  . Orange Fruit [Citrus] Itching and Swelling  . Vicodin [Hydrocodone-Acetaminophen] Hives and Itching  . Dust Mite Extract Itching  . Morphine Itching  . Glimepiride Itching  . Penicillins Hives  . Sulfa Antibiotics Hives     Thank you for allowing pharmacy to be a part of this patient's care.  Adrian Saran, PharmD, BCPS [phone] until 3pm 10/02/2020 9:14 AM

## 2020-10-02 NOTE — Progress Notes (Signed)
PROGRESS NOTE    Alexis Molina  TIR:[id number] DOB: Aug 25, 1951 DOA: 09/28/2020 PCP: Hulan Fess, MD    Brief Narrative:  69 y.o. female with history of hypertension, post ablative hypothyroidism presents to the ER at [facility] with complaints of abdominal pain.  Patient has benign left upper quadrant and epigastric abdominal discomfort for the last 10 days with loose stools.  Patient only has one episode of loose stools every day with no blood in it denies any fever chills vomiting is able to tolerate diet and is able to take her medications.  The abdominal pain is not related to food.  Patient states she has had an EGD last year at [facility] for abdominal discomfort and was told she had a narrow esophagus.  Has had a procedure done for bowel incontinence 2 years ago at [facility] by Dr. Amalia Hailey.  Since the pain was worsening patient came to the ER.  Denies taking any antibiotics recently.  ED Course: In the ER patient was afebrile not hypoxic and Covid test was negative.  Labs are largely unremarkable with creatinine mildly elevated at baseline.  LFTs were normal.  CT abdomen pelvis shows jejunal thickening concerning for infectious versus inflammatory possible ischemic enteritis.   Assessment & Plan:   Principal Problem:   Generalized abdominal pain Active Problems:   Enteritis   Essential hypertension   Hypothyroidism   Abdominal pain  1. Enteritis involving the jejunal area 1. Unclear etiology, although there were concerns of inflammatory vs infectious vs ischemic changes 2. General Surgery had been following, since signed off 10/26 3. Abd pain somewhat better today 4. CT enterography reviewed. Findings notable for new gastritis/duodenitis since recent exam. Presenting jejunal abnormality no longer seen 5. GI had been following and pt is now s/p EGD 10/26 with unremarkable findings with mild antral gastritis. Biopsies were taken. GI has since signed off  with plan to f/u as outpt in 6-8 weeks 6. Currently remains on clear diet, advance as tolerated 2. History of bowel incontinence 1. Pt is s/p implant placed at [facility] 2 years ago, stable 3. Hypertension 1. Pt had been continued on PRN IV labetalol  2. BP remains had been stable off BP meds, now trending up 3. Will resume home lisinopril today 4. Hypothyroidism 1. Continued on home synthroid 5. Chronic renal failure stage II  1. Creatinine appears stable this AM 2. Recheck CMP in AM 6. Mild hypokalemia 1. Potassium currently low, will replace 2. Recheck bmet in AM  DVT prophylaxis: Heparin subq Code Status: Full Family Communication: Pt in room, family not at bedside  Status is: Inpatient  Remains inpatient appropriate because:Inpatient level of care appropriate due to severity of illness   Dispo: The patient is from: Home              Anticipated d/c is to: Home              Anticipated d/c date is: 1 day              Patient currently is not medically stable to d/c. Advancing det    Consultants:   GI  General Surgery  Procedures:   EGD 10/26  Antimicrobials: Anti-infectives (From admission, onward)   Start     Dose/Rate Route Frequency Ordered Stop   09/29/20 1200  ciprofloxacin (CIPRO) IVPB 400 mg        400 mg 200 mL/hr over 60 Minutes Intravenous Every 12 hours 09/29/20 0525  09/29/20 0900  metroNIDAZOLE (FLAGYL) IVPB 500 mg        500 mg 100 mL/hr over 60 Minutes Intravenous Every 8 hours 09/29/20 0519     09/28/20 2330  ciprofloxacin (CIPRO) IVPB 400 mg        400 mg 200 mL/hr over 60 Minutes Intravenous  Once 09/28/20 2318 09/29/20 0109   09/28/20 2330  metroNIDAZOLE (FLAGYL) IVPB 500 mg        500 mg 100 mL/hr over 60 Minutes Intravenous  Once 09/28/20 2318 09/29/20 0212      Subjective: Reports feeling better today  Objective: Vitals:   10/02/20 1100 10/02/20 1215 10/02/20 1220 10/02/20 1230  BP: (!) 147/60 (!) 127/57 (!) 125/53  136/69  Pulse: (!) 58 69  (!) 55  Resp: 17 (!) 24  17  Temp: 98 F (36.7 C)     TempSrc: Oral     SpO2: 99% 100%  100%  Weight: 70.3 kg     Height: 5\' 2"  (1.575 m)       Intake/Output Summary (Last 24 hours) at 10/02/2020 1642 Last data filed at 10/02/2020 1324 Gross per 24 hour  Intake 2328.8 ml  Output 600 ml  Net 1728.8 ml   Filed Weights   09/29/20 0309 10/02/20 1100  Weight: 72.6 kg 70.3 kg    Examination: General exam: Awake, laying in bed, in nad Respiratory system: Normal respiratory effort, no wheezing Cardiovascular system: regular rate, s1, s2 Gastrointestinal system: Soft, nondistended, positive BS Central nervous system: CN2-12 grossly intact, strength intact Extremities: Perfused, no clubbing Skin: Normal skin turgor, no notable skin lesions seen Psychiatry: Mood normal // no visual hallucinations   Data Reviewed: I have personally reviewed following labs and imaging studies  CBC: Recent Labs  Lab 09/28/20 1539 09/29/20 0533 09/30/20 0610 10/01/20 0505 10/02/20 0437  WBC 5.2 5.0 3.2* 3.6* 4.0  NEUTROABS  --  3.2  --   --   --   HGB 13.0 11.7* 9.9* 10.1* 9.7*  HCT 38.4 35.0* 29.2* 30.4* 28.6*  MCV 79.7* 80.6 80.9 82.2 80.1  PLT 259 209 168 192 169   Basic Metabolic Panel: Recent Labs  Lab 09/28/20 1539 09/29/20 0533 09/30/20 0610 10/01/20 0505 10/02/20 0437  NA 137 140 140 143 141  K 3.0* 4.1 3.6 4.0 3.1*  CL 98 105 107 110 111  CO2 27 26 27 26 24   GLUCOSE 94 94 117* 116* 123*  BUN 12 10 8  6* <5*  CREATININE 1.02* 1.07* 1.09* 1.07* 0.99  CALCIUM 9.9 9.2 8.2* 8.2* 7.9*  MG 1.7  --   --   --   --    GFR: Estimated Creatinine Clearance: 50 mL/min (by C-G formula based on SCr of 0.99 mg/dL). Liver Function Tests: Recent Labs  Lab 09/28/20 1539 09/29/20 0533 09/30/20 0610 10/01/20 0505 10/02/20 0437  AST 24 19 20 18 27   ALT 20 17 15 15 19   ALKPHOS 54 49 39 39 41  BILITOT 1.1 1.2 0.8 0.8 0.6  PROT 8.1 6.8 5.5* 5.4* 5.3*    ALBUMIN 4.5 3.8 3.1* 3.0* 2.9*   Recent Labs  Lab 09/28/20 1539 09/29/20 0533  LIPASE 40 32   No results for input(s): AMMONIA in the last 168 hours. Coagulation Profile: No results for input(s): INR, PROTIME in the last 168 hours. Cardiac Enzymes: No results for input(s): CKTOTAL, CKMB, CKMBINDEX, TROPONINI in the last 168 hours. BNP (last 3 results) No results for input(s): PROBNP in the last 8760 hours.  HbA1C: No results for input(s): HGBA1C in the last 72 hours. CBG: Recent Labs  Lab 10/01/20 1208 10/01/20 1728 10/02/20 0000 10/02/20 0602 10/02/20 1114  GLUCAP 120* 96 179* 124* 98   Lipid Profile: No results for input(s): CHOL, HDL, LDLCALC, TRIG, CHOLHDL, LDLDIRECT in the last 72 hours. Thyroid Function Tests: No results for input(s): TSH, T4TOTAL, FREET4, T3FREE, THYROIDAB in the last 72 hours. Anemia Panel: No results for input(s): VITAMINB12, FOLATE, FERRITIN, TIBC, IRON, RETICCTPCT in the last 72 hours. Sepsis Labs: Recent Labs  Lab 09/28/20 1842 09/28/20 2038 09/29/20 0533 09/29/20 0737  LATICACIDVEN 1.3 1.2 2.5* 0.8    Recent Results (from the past 240 hour(s))  Urine culture     Status: Abnormal   Collection Time: 09/28/20  3:40 PM   Specimen: Urine, Random  Result Value Ref Range Status   Specimen Description   Final    URINE, RANDOM Performed at [facility], [address] [place], [place] [address]    Special Requests   Final    NONE Performed at [facility] [place], [place] [address]    Culture (A)  Final    <10,000 COLONIES/mL INSIGNIFICANT GROWTH Performed at [facility], [place] [address], [place], [place] [address]    Report Status 09/30/2020 FINAL  Final  Respiratory Panel by RT PCR (Flu A&B, Covid) - Nasopharyngeal Swab     Status: None   Collection Time: 09/29/20 12:10 AM   Specimen: Nasopharyngeal Swab  Result Value Ref Range Status   SARS Coronavirus 2 by RT PCR NEGATIVE NEGATIVE  Final    Comment: (NOTE) SARS-CoV-2 target nucleic acids are NOT DETECTED.  The SARS-CoV-2 RNA is generally detectable in upper respiratoy specimens during the acute phase of infection. The lowest concentration of SARS-CoV-2 viral copies this assay can detect is 131 copies/mL. A negative result does not preclude SARS-Cov-2 infection and should not be used as the sole basis for treatment or other patient management decisions. A negative result may occur with  improper specimen collection/handling, submission of specimen other than nasopharyngeal swab, presence of viral mutation(s) within the areas targeted by this assay, and inadequate number of viral copies (<131 copies/mL). A negative result must be combined with clinical observations, patient history, and epidemiological information. The expected result is Negative.  Fact Sheet for Patients:  PinkCheek.be  Fact Sheet for Healthcare Providers:  GravelBags.it  This test is no t yet approved or cleared by the [place] FDA and  has been authorized for detection and/or diagnosis of SARS-CoV-2 by FDA under an Emergency Use Authorization (EUA). This EUA will remain  in effect (meaning this test can be used) for the duration of the COVID-19 declaration under Section 564(b)(1) of the Act, 21 U.S.C. section 360bbb-3(b)(1), unless the authorization is terminated or revoked sooner.     Influenza A by PCR NEGATIVE NEGATIVE Final   Influenza B by PCR NEGATIVE NEGATIVE Final    Comment: (NOTE) The Xpert Xpress SARS-CoV-2/FLU/RSV assay is intended as an aid in  the diagnosis of influenza from Nasopharyngeal swab specimens and  should not be used as a sole basis for treatment. Nasal washings and  aspirates are unacceptable for Xpert Xpress SARS-CoV-2/FLU/RSV  testing.  Fact Sheet for Patients: PinkCheek.be  Fact Sheet for Healthcare  Providers: GravelBags.it  This test is not yet approved or cleared by the [place] FDA and  has been authorized for detection and/or diagnosis of SARS-CoV-2 by  FDA under an Emergency Use Authorization (EUA). This EUA will remain  in effect (meaning this test can be used) for the duration of the  Covid-19 declaration under Section 564(b)(1) of the Act, 21  U.S.C. section 360bbb-3(b)(1), unless the authorization is  terminated or revoked. Performed at [facility], [address] [place], [place] [address]      Radiology Studies: CT ENTERO ABD/PELVIS W CONTAST  Result Date: 10/01/2020 CLINICAL DATA:  Infectious gastroenteritis or colitis, abdominal pain for 2 weeks, nausea and vomiting or weight loss EXAM: CT ABDOMEN AND PELVIS WITH CONTRAST (ENTEROGRAPHY) TECHNIQUE: Multidetector CT of the abdomen and pelvis during bolus administration of intravenous contrast. Negative oral contrast was given. CONTRAST:  151mL OMNIPAQUE IOHEXOL 300 MG/ML SOLN, additional negative oral enteric contrast COMPARISON:  09/28/2020 FINDINGS: Lower chest: Small bilateral pleural effusions and associated atelectasis or consolidation. Hepatobiliary: No solid liver abnormality is seen. Redemonstrated definitively benign hepatic hemangiomata with peripheral nodular enhancement (series 2, image 15). Status post cholecystectomy. No biliary dilatation. Pancreas: Unremarkable. No pancreatic ductal dilatation or surrounding inflammatory changes. Spleen: Normal in size without significant abnormality. Adrenals/Urinary Tract: Adrenal glands are unremarkable. Kidneys are normal, without renal calculi, solid lesion, or hydronephrosis. Bladder is unremarkable. Stomach/Bowel: Mucosal thickening of the pylorus and duodenal bulb (series 2, image 25). Previously noted abnormality of the proximal jejunum is not appreciated. The proximal small bowel is generally under distended but unremarkable in  appearance (series 2, image 46). Appendix appears normal. Fluid throughout the proximal colon. No evidence of bowel wall thickening, distention, or inflammatory changes. Vascular/Lymphatic: Scattered aortic atherosclerosis. No enlarged abdominal or pelvic lymph nodes. Reproductive: Status post hysterectomy. Other: No abdominal wall hernia or abnormality. No abdominopelvic ascites. Musculoskeletal: No acute or significant osseous findings. IMPRESSION: 1. Mucosal thickening of the pylorus and duodenal bulb, suggestive of nonspecific infectious or inflammatory gastritis/duodenitis, new compared to prior examination. 2. Previously noted abnormality of the proximal jejunum is not appreciated. The proximal small bowel is generally underdistended but unremarkable in appearance on this examination. 3. Small bilateral pleural effusions and associated atelectasis or consolidation, new compared to prior examination. 4. Redemonstrated definitively benign hepatic hemangiomata. 5. Aortic atherosclerosis (ICD10-I70.0). Electronically Signed   By: Eddie Candle M.D.   On: 10/01/2020 16:09    Scheduled Meds: . feeding supplement  1 Container Oral TID BM  . feeding supplement  237 mL Oral BID BM  . heparin  5,000 Units Subcutaneous Q8H  . levothyroxine  50 mcg Oral Daily  . pantoprazole (PROTONIX) IV  40 mg Intravenous Q12H  . rosuvastatin  20 mg Oral q1800   Continuous Infusions: . ciprofloxacin 400 mg (10/02/20 1304)  . dextrose 5 % and 0.9% NaCl 100 mL/hr at 10/02/20 7121  . lactated ringers 10 mL/hr at 10/02/20 1154  . metronidazole 500 mg (10/02/20 0115)     LOS: 3 days   Marylu Lund, MD [facility] Pager On Amion  If 7PM-7AM, please contact night-coverage 10/02/2020, 4:42 PM

## 2020-10-02 NOTE — Interval H&P Note (Signed)
History and Physical Interval Note:  10/02/2020 11:51 AM  Alexis Molina  has presented today for surgery, with the diagnosis of Abdominal pain, enteritis, gastritis.  The various methods of treatment have been discussed with the patient and family. After consideration of risks, benefits and other options for treatment, the patient has consented to  Procedure(s): ESOPHAGOGASTRODUODENOSCOPY (EGD) WITH PROPOFOL (N/A) as a surgical intervention.  The patient's history has been reviewed, patient examined, no change in status, stable for surgery.  I have reviewed the patient's chart and labs.  Questions were answered to the patient's satisfaction.     Lear Ng

## 2020-10-02 NOTE — Progress Notes (Signed)
Patient ID: Alexis Molina, female   DOB: 1951/07/21, 69 y.o.   MRN: [id number]  EGD showed mild antral gastritis and otherwise was unrevealing. Antral biopsies taken and will f/u as outpt. Advance diet. No further GI recs. F/U with me in 6-8 weeks. Will sign off. Call if questions.

## 2020-10-02 NOTE — Progress Notes (Signed)
[facility] Progress Note     Subjective: CC-  Feeling better than yesterday. States that she has no abdominal pain at this time, but may still be sore with palpation. Some nausea last night with full liquids, no emesis. She had a few small, loose BMs over night. Passing flatus.  CT entero shows nonspecific infectious or inflammatory gastritis/duodenitis; previously noted abnormality of the proximal jejunum is not appreciated. She is going for EGD today with GI.  Objective: Vital signs in last 24 hours: Temp:  [97.4 F (36.3 C)-98.8 F (37.1 C)] 98.8 F (37.1 C) (10/26 0607) Pulse Rate:  [57-69] 69 (10/26 0607) Resp:  [18] 18 (10/26 0607) BP: (129-140)/(55-64) 129/63 (10/26 0607) SpO2:  [98 %-100 %] 98 % (10/26 0607) Last BM Date: 10/01/20  Intake/Output from previous day: 10/25 0701 - 10/26 0700 In: 3041 [P.O.:840; I.V.:1501; IV Piggyback:700] Out: 600 [Urine:600] Intake/Output this shift: No intake/output data recorded.  PE: Gen:  Alert, NAD, pleasant HEENT: EOM's intact, pupils equal and round Card:  RRR Pulm:  CTAB, no W/R/R, rate and effort normal Abd: Soft, nondistended, +BS, no HSM, mild LUQ TTP  Psych: A&Ox4  Neuro: no focal deficits Skin: no rashes noted, warm and dry   Lab Results:  Recent Labs    10/01/20 0505 10/02/20 0437  WBC 3.6* 4.0  HGB 10.1* 9.7*  HCT 30.4* 28.6*  PLT 192 185   BMET Recent Labs    10/01/20 0505 10/02/20 0437  NA 143 141  K 4.0 3.1*  CL 110 111  CO2 26 24  GLUCOSE 116* 123*  BUN 6* <5*  CREATININE 1.07* 0.99  CALCIUM 8.2* 7.9*   PT/INR No results for input(s): LABPROT, INR in the last 72 hours. CMP     Component Value Date/Time   NA 141 10/02/2020 0437   NA 141 01/25/2020 1054   K 3.1 (L) 10/02/2020 0437   CL 111 10/02/2020 0437   CO2 24 10/02/2020 0437   GLUCOSE 123 (H) 10/02/2020 0437   BUN <5 (L) 10/02/2020 0437   BUN 15 01/25/2020 1054   CREATININE 0.99 10/02/2020 0437   CALCIUM 7.9 (L)  10/02/2020 0437   PROT 5.3 (L) 10/02/2020 0437   ALBUMIN 2.9 (L) 10/02/2020 0437   AST 27 10/02/2020 0437   ALT 19 10/02/2020 0437   ALKPHOS 41 10/02/2020 0437   BILITOT 0.6 10/02/2020 0437   GFRNONAA >60 10/02/2020 0437   GFRAA 63 01/25/2020 1054   Lipase     Component Value Date/Time   LIPASE 32 09/29/2020 0533       Studies/Results: CT ENTERO ABD/PELVIS W CONTAST  Result Date: 10/01/2020 CLINICAL DATA:  Infectious gastroenteritis or colitis, abdominal pain for 2 weeks, nausea and vomiting or weight loss EXAM: CT ABDOMEN AND PELVIS WITH CONTRAST (ENTEROGRAPHY) TECHNIQUE: Multidetector CT of the abdomen and pelvis during bolus administration of intravenous contrast. Negative oral contrast was given. CONTRAST:  143mL OMNIPAQUE IOHEXOL 300 MG/ML SOLN, additional negative oral enteric contrast COMPARISON:  09/28/2020 FINDINGS: Lower chest: Small bilateral pleural effusions and associated atelectasis or consolidation. Hepatobiliary: No solid liver abnormality is seen. Redemonstrated definitively benign hepatic hemangiomata with peripheral nodular enhancement (series 2, image 15). Status post cholecystectomy. No biliary dilatation. Pancreas: Unremarkable. No pancreatic ductal dilatation or surrounding inflammatory changes. Spleen: Normal in size without significant abnormality. Adrenals/Urinary Tract: Adrenal glands are unremarkable. Kidneys are normal, without renal calculi, solid lesion, or hydronephrosis. Bladder is unremarkable. Stomach/Bowel: Mucosal thickening of the pylorus and duodenal bulb (series 2, image  25). Previously noted abnormality of the proximal jejunum is not appreciated. The proximal small bowel is generally under distended but unremarkable in appearance (series 2, image 46). Appendix appears normal. Fluid throughout the proximal colon. No evidence of bowel wall thickening, distention, or inflammatory changes. Vascular/Lymphatic: Scattered aortic atherosclerosis. No enlarged  abdominal or pelvic lymph nodes. Reproductive: Status post hysterectomy. Other: No abdominal wall hernia or abnormality. No abdominopelvic ascites. Musculoskeletal: No acute or significant osseous findings. IMPRESSION: 1. Mucosal thickening of the pylorus and duodenal bulb, suggestive of nonspecific infectious or inflammatory gastritis/duodenitis, new compared to prior examination. 2. Previously noted abnormality of the proximal jejunum is not appreciated. The proximal small bowel is generally underdistended but unremarkable in appearance on this examination. 3. Small bilateral pleural effusions and associated atelectasis or consolidation, new compared to prior examination. 4. Redemonstrated definitively benign hepatic hemangiomata. 5. Aortic atherosclerosis (ICD10-I70.0). Electronically Signed   By: Eddie Candle M.D.   On: 10/01/2020 16:09    Anti-infectives: Anti-infectives (From admission, onward)   Start     Dose/Rate Route Frequency Ordered Stop   09/29/20 1200  ciprofloxacin (CIPRO) IVPB 400 mg        400 mg 200 mL/hr over 60 Minutes Intravenous Every 12 hours 09/29/20 0525     09/29/20 0900  metroNIDAZOLE (FLAGYL) IVPB 500 mg        500 mg 100 mL/hr over 60 Minutes Intravenous Every 8 hours 09/29/20 0519     09/28/20 2330  ciprofloxacin (CIPRO) IVPB 400 mg        400 mg 200 mL/hr over 60 Minutes Intravenous  Once 09/28/20 2318 09/29/20 0109   09/28/20 2330  metroNIDAZOLE (FLAGYL) IVPB 500 mg        500 mg 100 mL/hr over 60 Minutes Intravenous  Once 09/28/20 2318 09/29/20 0212       Assessment/Plan HTN Hypothyroidism CKD  Abdominal pain, focal thickening of segment of jejunum - etiology unclear - CT entero 10/25 showed nonspecific infectious or inflammatory gastritis/duodenitis; previously noted abnormality of the proximal jejunum is not appreciated. - Symptoms improving, only mild left sided abdominal tenderness on exam today. Will see what GI finds on EGD. No plans for surgical  intervention at this time.  ID - cipro/flagyl 10/22>>day#4 FEN - IVF, NPO for procedure VTE - sq heparin Foley - none Follow up - TBD    LOS: 3 days    Wellington Hampshire, [facility] Surgery 10/02/2020, 9:01 AM Please see Amion for pager number during day hours 7:00am-4:30pm

## 2020-10-02 NOTE — Anesthesia Preprocedure Evaluation (Addendum)
Anesthesia Evaluation  Patient identified by MRN, date of birth, ID band Patient awake    Reviewed: Allergy & Precautions, NPO status , Patient's Chart, lab work & pertinent test results, reviewed documented beta blocker date and time   History of Anesthesia Complications Negative for: history of anesthetic complications  Airway Mallampati: II  TM Distance: >3 FB Neck ROM: Full    Dental  (+) Teeth Intact, Dental Advisory Given   Pulmonary COPD,  COPD inhaler, former smoker,  09/29/2020 SARS coronavirus NEG   breath sounds clear to auscultation       Cardiovascular hypertension, Pt. on medications and Pt. on home beta blockers (-) angina Rhythm:Regular Rate:Normal  '16 cath: no significant coronary disease   Neuro/Psych negative neurological ROS     GI/Hepatic Neg liver ROS, GERD  Medicated,abd pain   Endo/Other  neg diabetesHypothyroidism Pre-diabetic: no meds  Renal/GU      Musculoskeletal   Abdominal   Peds  Hematology  (+) Blood dyscrasia (Hb 9.7), anemia ,   Anesthesia Other Findings   Reproductive/Obstetrics                            Anesthesia Physical Anesthesia Plan  ASA: III  Anesthesia Plan: MAC   Post-op Pain Management:    Induction:   PONV Risk Score and Plan: 2 and Treatment may vary due to age or medical condition  Airway Management Planned: Natural Airway and Nasal Cannula  Additional Equipment: None  Intra-op Plan:   Post-operative Plan:   Informed Consent: I have reviewed the patients History and Physical, chart, labs and discussed the procedure including the risks, benefits and alternatives for the proposed anesthesia with the patient or authorized representative who has indicated his/her understanding and acceptance.     Dental advisory given  Plan Discussed with: CRNA and Surgeon  Anesthesia Plan Comments:        Anesthesia Quick  Evaluation

## 2020-10-02 NOTE — Transfer of Care (Signed)
Immediate Anesthesia Transfer of Care Note  Patient: Alexis Molina  Procedure(s) Performed: ESOPHAGOGASTRODUODENOSCOPY (EGD) WITH PROPOFOL (N/A ) BIOPSY  Patient Location: Endoscopy Unit  Anesthesia Type:MAC  Level of Consciousness: awake and patient cooperative  Airway & Oxygen Therapy: Patient Spontanous Breathing and Patient connected to face mask  Post-op Assessment: Report given to RN and Post -op Vital signs reviewed and stable  Post vital signs: Reviewed and stable  Last Vitals:  Vitals Value Taken Time  BP    Temp    Pulse 69 10/02/20 1215  Resp 24 10/02/20 1215  SpO2 100 % 10/02/20 1215  Vitals shown include unvalidated device data.  Last Pain:  Vitals:   10/02/20 1100  TempSrc: Oral  PainSc: 0-No pain      Patients Stated Pain Goal: 2 (37/29/02 1115)  Complications: No complications documented.

## 2020-10-02 NOTE — Anesthesia Postprocedure Evaluation (Signed)
Anesthesia Post Note  Patient: Alexis Molina  Procedure(s) Performed: ESOPHAGOGASTRODUODENOSCOPY (EGD) WITH PROPOFOL (N/A ) BIOPSY     Patient location during evaluation: Endoscopy Anesthesia Type: MAC Level of consciousness: awake and alert, patient cooperative and oriented Pain management: pain level controlled Vital Signs Assessment: post-procedure vital signs reviewed and stable Respiratory status: nonlabored ventilation, spontaneous breathing and respiratory function stable Cardiovascular status: stable and blood pressure returned to baseline Postop Assessment: no apparent nausea or vomiting Anesthetic complications: no   No complications documented.  Last Vitals:  Vitals:   10/02/20 1220 10/02/20 1230  BP: (!) 125/53 136/69  Pulse:  (!) 55  Resp:  17  Temp:    SpO2:  100%    Last Pain:  Vitals:   10/02/20 1230  TempSrc:   PainSc: 0-No pain                 Riham Polyakov,E. Lemoyne Scarpati

## 2020-10-03 ENCOUNTER — Encounter (HOSPITAL_COMMUNITY): Payer: Self-pay | Admitting: Gastroenterology

## 2020-10-03 ENCOUNTER — Other Ambulatory Visit: Payer: Self-pay

## 2020-10-03 DIAGNOSIS — E039 Hypothyroidism, unspecified: Secondary | ICD-10-CM

## 2020-10-03 DIAGNOSIS — K529 Noninfective gastroenteritis and colitis, unspecified: Secondary | ICD-10-CM | POA: Diagnosis not present

## 2020-10-03 DIAGNOSIS — E876 Hypokalemia: Secondary | ICD-10-CM

## 2020-10-03 DIAGNOSIS — I10 Essential (primary) hypertension: Secondary | ICD-10-CM | POA: Diagnosis not present

## 2020-10-03 DIAGNOSIS — R1084 Generalized abdominal pain: Secondary | ICD-10-CM | POA: Diagnosis not present

## 2020-10-03 LAB — CBC
HCT: 29.7 % — ABNORMAL LOW (ref 36.0–46.0)
Hemoglobin: 10 g/dL — ABNORMAL LOW (ref 12.0–15.0)
MCH: 27.4 pg (ref 26.0–34.0)
MCHC: 33.7 g/dL (ref 30.0–36.0)
MCV: 81.4 fL (ref 80.0–100.0)
Platelets: 177 10*3/uL (ref 150–400)
RBC: 3.65 MIL/uL — ABNORMAL LOW (ref 3.87–5.11)
RDW: 14.7 % (ref 11.5–15.5)
WBC: 3.6 10*3/uL — ABNORMAL LOW (ref 4.0–10.5)
nRBC: 0 % (ref 0.0–0.2)

## 2020-10-03 LAB — COMPREHENSIVE METABOLIC PANEL
ALT: 25 U/L (ref 0–44)
AST: 35 U/L (ref 15–41)
Albumin: 2.8 g/dL — ABNORMAL LOW (ref 3.5–5.0)
Alkaline Phosphatase: 40 U/L (ref 38–126)
Anion gap: 6 (ref 5–15)
BUN: <5 mg/dL — ABNORMAL LOW (ref 8–23)
CO2: 21 mmol/L — ABNORMAL LOW (ref 22–32)
Calcium: 7.9 mg/dL — ABNORMAL LOW (ref 8.9–10.3)
Chloride: 113 mmol/L — ABNORMAL HIGH (ref 98–111)
Creatinine, Ser: 1.09 mg/dL — ABNORMAL HIGH (ref 0.44–1.00)
GFR, Estimated: 55 mL/min — ABNORMAL LOW (ref >60)
Glucose, Bld: 130 mg/dL — ABNORMAL HIGH (ref 70–99)
Potassium: 3.4 mmol/L — ABNORMAL LOW (ref 3.5–5.1)
Sodium: 140 mmol/L (ref 135–145)
Total Bilirubin: 0.6 mg/dL (ref 0.3–1.2)
Total Protein: 5.1 g/dL — ABNORMAL LOW (ref 6.5–8.1)

## 2020-10-03 LAB — MAGNESIUM: Magnesium: 1.3 mg/dL — ABNORMAL LOW (ref 1.7–2.4)

## 2020-10-03 LAB — GLUCOSE, CAPILLARY
Glucose-Capillary: 128 mg/dL — ABNORMAL HIGH (ref 70–99)
Glucose-Capillary: 110 mg/dL — ABNORMAL HIGH (ref 70–99)

## 2020-10-03 LAB — SURGICAL PATHOLOGY

## 2020-10-03 MED ORDER — PANTOPRAZOLE SODIUM 40 MG PO TBEC
40.0000 mg | DELAYED_RELEASE_TABLET | Freq: Two times a day (BID) | ORAL | Status: DC
  Administered 2020-10-03 (×2): 40 mg via ORAL
  Filled 2020-10-03: qty 1

## 2020-10-03 MED ORDER — MAGNESIUM SULFATE 4 GM/100ML IV SOLN
4.0000 g | Freq: Once | INTRAVENOUS | Status: AC
  Administered 2020-10-03: 14:00:00 4 g via INTRAVENOUS
  Filled 2020-10-03: qty 100

## 2020-10-03 MED ORDER — OMEPRAZOLE 40 MG PO CPDR
40.0000 mg | DELAYED_RELEASE_CAPSULE | Freq: Every day | ORAL | 2 refills | Status: DC
Start: 2020-10-03 — End: 2021-07-22

## 2020-10-03 MED ORDER — TRIAMTERENE-HCTZ 75-50 MG PO TABS: 1.0000 | ORAL_TABLET | Freq: Every day | ORAL | Status: AC

## 2020-10-03 MED ORDER — POTASSIUM CHLORIDE CRYS ER 20 MEQ PO TBCR
40.0000 meq | EXTENDED_RELEASE_TABLET | Freq: Once | ORAL | Status: AC
  Administered 2020-10-03: 40 meq via ORAL
  Filled 2020-10-03: qty 2

## 2020-10-03 MED ORDER — METRONIDAZOLE 500 MG PO TABS
500.0000 mg | ORAL_TABLET | Freq: Three times a day (TID) | ORAL | Status: DC
  Administered 2020-10-03 (×2): 500 mg via ORAL
  Filled 2020-10-03 (×2): qty 1

## 2020-10-03 MED ORDER — MAGNESIUM OXIDE 400 (241.3 MG) MG PO TABS: 400.0000 mg | ORAL_TABLET | Freq: Two times a day (BID) | ORAL | 0 refills | Status: AC

## 2020-10-03 MED ORDER — MAGNESIUM OXIDE 400 (241.3 MG) MG PO TABS: 400.0000 mg | ORAL_TABLET | Freq: Two times a day (BID) | ORAL | Status: DC

## 2020-10-03 NOTE — Progress Notes (Signed)
Chaplain engaged in follow-up visit with Alexis Molina.  Alexis Molina shared she was wondering about how to do follow-ups with chaplain.  Chaplain let her know to have her paged or to have nurse put in a consult.  Alexis Molina and chaplain checked-in with each other discussing Alexis Molina's children, the culture of [place] where Alexis Molina lives, and her family.    Chaplain will continue to follow-up and provide support.     10/03/20 1200  Clinical Encounter Type  Visited With Patient  Visit Type Follow-up

## 2020-10-03 NOTE — Progress Notes (Signed)
Discharge instructions discussed with patient, verbalized agreement and understanding 

## 2020-10-03 NOTE — Discharge Summary (Signed)
Physician Discharge Summary  DARYL QUIROS TDD:[id number] DOB: [birth year] DOA: 09/28/2020  PCP: Hulan Fess, MD  Admit date: 09/28/2020 Discharge date: 10/03/2020  Time spent: 55 minutes  Recommendations for Outpatient Follow-up:  1. Follow-up with Hulan Fess, MD in 1 week.  On follow-up patient will need a basic metabolic profile, magnesium level checked to follow-up on electrolytes and renal function.  Blood pressure also need to be reassessed. 2. Follow-up with Dr. Michail Sermon, gastroenterology in 6 weeks.   Discharge Diagnoses:  Principal Problem:   Generalized abdominal pain Active Problems:   Enteritis   Essential hypertension   Hypothyroidism   Abdominal pain   Hypomagnesemia   Hypokalemia   Discharge Condition: Stable and improved  Diet recommendation: Heart healthy  Filed Weights   09/29/20 0309 10/02/20 1100  Weight: 72.6 kg 70.3 kg    History of present illness:  HPI per Dr. Marella Chimes Alexis Molina is a 69 y.o. female with history of hypertension, post ablative hypothyroidism presents to the ER at [facility] with complaints of abdominal pain.  Patient has benign left upper quadrant and epigastric abdominal discomfort for the last 10 days with loose stools.  Patient only has one episode of loose stools every day with no blood in it denies any fever chills vomiting is able to tolerate diet and is able to take her medications.  The abdominal pain is not related to food.  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  Has had a procedure done for bowel incontinence 2 years ago at [facility] by Dr. Amalia Hailey.  Since the pain was worsening patient came to the ER.  Denies taking any antibiotics recently.  ED Course: In the ER patient was afebrile not hypoxic and Covid test was negative.  Labs are largely unremarkable with creatinine mildly elevated at baseline.  LFTs  were normal.  CT abdomen pelvis shows jejunal thickening concerning for infectious versus inflammatory possible ischemic enteritis.  Lactic acid was normal.  Patient was started on empiric antibiotics fluids and admitted for further management.  Potassium was mildly low.  Hospital Course:  #1 enteritis involving jejunal area/abnormal CT abdomen and pelvis/acute gastritis Patient had presented with complaints of abdominal pain, loose stools.  CT abdomen and pelvis which was done on admission showed jejunal thickening concerning for infectious versus inflammatory versus ischemic enteritis.  GI was consulted as well as general surgery.  General surgery assessed patient and no surgical indication as such signed off 10/02/2020.  CT enterography was done that showed new gastritis/duodenitis since recent exam, presenting jejunal abnormality no longer seen.  Patient subsequently underwent upper endoscopy 10/02/2020 which showed acute gastritis without bleeding.  Biopsies were obtained.  Patient maintained on IV PPI twice daily during the hospitalization and transition to oral PPI daily.  GI subsequently signed off with recommendations to follow-up in the outpatient setting for 6 to 8 weeks.  On admission patient placed empirically on IV antibiotics and received a total of 5 to 6 days of antibiotics during this hospitalization.  Patient improved clinically.  No further antibiotics needed on discharge.  Patient be discharged home on PPI daily.  Outpatient follow-up with PCP and gastroenterology.  2.  Hypertension Patient's antihypertensive medications were held on admission.  As patient improved clinically blood pressure started to trend back up.  Patient's lisinopril was resumed.  Patient's triamterene HCTZ was held during the hospitalization will be  resumed 5 days post discharge.  Outpatient follow-up with PCP.  3.  Hypokalemia/hypomagnesemia Likely secondary to GI losses.  Repleted.  Patient will be discharged  home on 1 week of magnesium oxide.  Outpatient follow-up with PCP.  4.  Chronic renal failure stage II Remained stable throughout the hospitalization.  5.  History of bowel incontinence Status post implant placed at [facility] 2 years ago.  Stable.  Outpatient follow-up.  6.  Hypothyroidism Patient maintained on home regimen Synthroid.  Outpatient follow-up.  Procedures:  CT abdomen and pelvis 09/28/2020  CT enterography abdomen and pelvis 10/01/2020  Upper endoscopy 10/02/2020 per Dr. Michail Sermon--- acute gastritis without bleeding.  Consultations:  Gastroenterology: Dr. Paulita Fujita 09/29/2020  General surgery: Dr. Harlow Asa 09/29/2020  Discharge Exam: Vitals:   10/03/20 0619 10/03/20 1357  BP: 125/66 129/61  Pulse: (!) 56 (!) 56  Resp: 16 18  Temp: 98.8 F (37.1 C) 98.7 F (37.1 C)  SpO2: 100% 100%    General: NAD Cardiovascular: RRR Respiratory: CTAB  Discharge Instructions   Discharge Instructions    Diet - low sodium heart healthy   Complete by: As directed    Increase activity slowly   Complete by: As directed      Allergies as of 10/03/2020      Reactions   Darvocet [propoxyphene N-acetaminophen] Other (See Comments)   Asthma attack   Orange Fruit [citrus] Itching, Swelling   Vicodin [hydrocodone-acetaminophen] Hives, Itching   Dust Mite Extract Itching   Morphine Itching   Glimepiride Itching   Penicillins Hives   Sulfa Antibiotics Hives      Medication List    STOP taking these medications   Fish Oil 1000 MG Caps   melatonin 5 MG Tabs     TAKE these medications   albuterol 108 (90 Base) MCG/ACT inhaler Commonly known as: VENTOLIN HFA Inhale 2 puffs into the lungs every 4 (four) hours as needed for wheezing or shortness of breath.   diphenhydrAMINE 25 MG tablet Commonly known as: BENADRYL Take 25 mg by mouth every 6 (six) hours as needed. Itching   fexofenadine 180 MG tablet Commonly known as: ALLEGRA Take 180 mg by mouth daily.    levothyroxine 50 MCG tablet Commonly known as: SYNTHROID Take 50 mcg by mouth daily. What changed: Another medication with the same name was removed. Continue taking this medication, and follow the directions you see here.   lisinopril 20 MG tablet Commonly known as: ZESTRIL Take 20 mg by mouth daily.   magnesium oxide 400 (241.3 Mg) MG tablet Commonly known as: MAG-OX Take 1 tablet (400 mg total) by mouth 2 (two) times daily for 7 days.   metoprolol tartrate 50 MG tablet Commonly known as: LOPRESSOR Take 1 tablet by mouth 2 hours prior to Cardiac CT   montelukast 10 MG tablet Commonly known as: SINGULAIR Take 10 mg by mouth daily.   multivitamin with minerals Tabs tablet Take 1 tablet by mouth daily.   omeprazole 40 MG capsule Commonly known as: PRILOSEC Take 1 capsule (40 mg total) by mouth daily.   polycarbophil 625 MG tablet Commonly known as: FIBERCON Take 625 mg by mouth daily.   rosuvastatin 20 MG tablet Commonly known as: CRESTOR Take 20 mg by mouth daily.   tiZANidine 2 MG tablet Commonly known as: ZANAFLEX Take 1 tablet by mouth daily as needed for muscle spasms.   triamterene-hydrochlorothiazide 75-50 MG tablet Commonly known as: MAXZIDE Take 1 tablet by mouth daily. Start taking on: October 07, 2020  What changed: These instructions start on October 07, 2020. If you are unsure what to do until then, ask your doctor or other care provider.   Vitamin D 125 MCG (5000 UT) Caps Take 5,000 Units by mouth daily.      Allergies  Allergen Reactions  . Darvocet [Propoxyphene N-Acetaminophen] Other (See Comments)    Asthma attack  . Orange Fruit [Citrus] Itching and Swelling  . Vicodin [Hydrocodone-Acetaminophen] Hives and Itching  . Dust Mite Extract Itching  . Morphine Itching  . Glimepiride Itching  . Penicillins Hives  . Sulfa Antibiotics Hives    Follow-up Information    Little, Lennette Bihari, MD. Schedule an appointment as soon as possible for a visit  in 1 week(s).   Specialty: Family Medicine Contact information: [place] [address] [phone]        Jettie Booze, MD .   Specialties: Cardiology, Radiology, Interventional Cardiology Contact information: [address] [place] [address] [phone]        Wilford Corner, MD. Schedule an appointment as soon as possible for a visit in 6 week(s).   Specialty: Gastroenterology Contact information: [address] [place] [address] [phone]                The results of significant diagnostics from this hospitalization (including imaging, microbiology, ancillary and laboratory) are listed below for reference.    Significant Diagnostic Studies: CT ABDOMEN PELVIS W CONTRAST  Addendum Date: 09/29/2020   ADDENDUM REPORT: 09/29/2020 08:19 ADDENDUM: Trace amount of free fluid in the pelvis is nonspecific. Differential considerations given that the patient is on an Ace inhibitor could also include angioedema of the small bowel, a rare complication related to Ace inhibitor use. This usually occurs within days of initiating this medication but can occasionally occur after chronic use. Would correlate with any repeated history of abdominal pain without clear explanation. These results were called by telephone at the time of interpretation on 09/29/2020 at 8:18 am to provider Dr. Wyline Copas, Who verbally acknowledged these results. Electronically Signed   By: Zetta Bills M.D.   On: 09/29/2020 08:19   Result Date: 09/29/2020 CLINICAL DATA:  Acute abdominal pain, LEFT-sided abdominal and flank pain, no improvement. EXAM: CT ABDOMEN AND PELVIS WITH CONTRAST TECHNIQUE: Multidetector CT imaging of the abdomen and pelvis was performed using the standard protocol following bolus administration of intravenous contrast. CONTRAST:  156mL OMNIPAQUE IOHEXOL 300 MG/ML  SOLN COMPARISON:  November 01, 2018 FINDINGS: Lower chest:  No consolidation.  No pleural effusion. Hepatobiliary: Hepatic hemangioma in the RIGHT hepatic lobe measures 4.3 x 3.3 cm, enlarged from November of 2019 where it measured approximately 3.1 x 2.8 cm. Hepatic hemangioma in the caudate and other small hemangiomata and cysts are stable. Portal vein is patent. Hepatic veins are patent. Post cholecystectomy with mild cholecystectomy biliary duct distension that is not changed. Pancreas: No pancreatic ductal dilation, inflammation or suspicious lesion. Stable intrapancreatic lipoma. Spleen: Spleen normal in size and contour. Adrenals/Urinary Tract: Adrenal glands are normal. Symmetric renal enhancement. Low-density lesion in the upper pole of the RIGHT kidney compatible with small cyst just at 1 cm. Symmetric renal enhancement. No hydronephrosis or suspicious renal lesion. Urinary bladder is normal. Stomach/Bowel: Marked thickening of a segment of jejunum with perienteric stranding. No signs of pneumatosis. Question poor enhancement of the medial wall of the jejunum (image 29 of series 2) with marked mural stratification involving approximately 5 cm of the jejunum. Mild  mesenteric stranding elsewhere in the jejunal mesentery. No ileal thickening. The appendix is normal. The remainder of the small bowel enhances normally. Colon is unremarkable. Vascular/Lymphatic: Calcified and noncalcified atheromatous plaque in the abdominal aorta. SMA is patent. SMV is patent. There is no gastrohepatic or hepatoduodenal ligament lymphadenopathy. No retroperitoneal or mesenteric lymphadenopathy. No pelvic adenopathy Reproductive: Post hysterectomy.  No adnexal mass. Other: No ascites.  No free air. Musculoskeletal: Sacral nerve stimulator, power pack over LEFT hip. No acute musculoskeletal findings. IMPRESSION: 1. Marked thickening of a segment of jejunum with perienteric stranding. Question poor enhancement of the medial wall of the jejunum (image 29 of series 2) marked mural  stratification involving approximately 5 cm of the jejunum. Findings may represent infectious or inflammatory enteritis. Given segmental nature and severity ischemia is also considered. Some hypoenhancement is seen along the medial wall though this could be related to edema. Consider correlation with lactate and repeat imaging if symptoms should worsen. 2. Some mild mesenteric elsewhere within the jejunal mesentery though to a lesser extent and without the mural stratification of bowel wall thickening seen in the segment described above. 3. Hepatic hemangiomas and cysts. 4. Aortic atherosclerosis. These results were called by telephone at the time of interpretation on 09/28/2020 at 6:56 pm to provider [facility] , who verbally acknowledged these results. Aortic Atherosclerosis (ICD10-I70.0). Electronically Signed: By: Zetta Bills M.D. On: 09/28/2020 18:57   CT ENTERO ABD/PELVIS W CONTAST  Result Date: 10/01/2020 CLINICAL DATA:  Infectious gastroenteritis or colitis, abdominal pain for 2 weeks, nausea and vomiting or weight loss EXAM: CT ABDOMEN AND PELVIS WITH CONTRAST (ENTEROGRAPHY) TECHNIQUE: Multidetector CT of the abdomen and pelvis during bolus administration of intravenous contrast. Negative oral contrast was given. CONTRAST:  193mL OMNIPAQUE IOHEXOL 300 MG/ML SOLN, additional negative oral enteric contrast COMPARISON:  09/28/2020 FINDINGS: Lower chest: Small bilateral pleural effusions and associated atelectasis or consolidation. Hepatobiliary: No solid liver abnormality is seen. Redemonstrated definitively benign hepatic hemangiomata with peripheral nodular enhancement (series 2, image 15). Status post cholecystectomy. No biliary dilatation. Pancreas: Unremarkable. No pancreatic ductal dilatation or surrounding inflammatory changes. Spleen: Normal in size without significant abnormality. Adrenals/Urinary Tract: Adrenal glands are unremarkable. Kidneys are normal, without renal calculi, solid  lesion, or hydronephrosis. Bladder is unremarkable. Stomach/Bowel: Mucosal thickening of the pylorus and duodenal bulb (series 2, image 25). Previously noted abnormality of the proximal jejunum is not appreciated. The proximal small bowel is generally under distended but unremarkable in appearance (series 2, image 46). Appendix appears normal. Fluid throughout the proximal colon. No evidence of bowel wall thickening, distention, or inflammatory changes. Vascular/Lymphatic: Scattered aortic atherosclerosis. No enlarged abdominal or pelvic lymph nodes. Reproductive: Status post hysterectomy. Other: No abdominal wall hernia or abnormality. No abdominopelvic ascites. Musculoskeletal: No acute or significant osseous findings. IMPRESSION: 1. Mucosal thickening of the pylorus and duodenal bulb, suggestive of nonspecific infectious or inflammatory gastritis/duodenitis, new compared to prior examination. 2. Previously noted abnormality of the proximal jejunum is not appreciated. The proximal small bowel is generally underdistended but unremarkable in appearance on this examination. 3. Small bilateral pleural effusions and associated atelectasis or consolidation, new compared to prior examination. 4. Redemonstrated definitively benign hepatic hemangiomata. 5. Aortic atherosclerosis (ICD10-I70.0). Electronically Signed   By: Eddie Candle M.D.   On: 10/01/2020 16:09   OCT, Retina - OU - Both Eyes  Result Date: 09/11/2020 Right Eye Quality was good. Scan locations included subfoveal. Central Foveal Thickness: 281. Progression has been stable. Findings include normal foveal contour. Left Eye Scan  locations included subfoveal. Central Foveal Thickness: 283. Progression has been stable. Findings include normal foveal contour. Notes Incidental posterior vitreous detachment noted left eye   Microbiology: Recent Results (from the past 240 hour(s))  Urine culture     Status: Abnormal   Collection Time: 09/28/20  3:40 PM    Specimen: Urine, Random  Result Value Ref Range Status   Specimen Description   Final    URINE, RANDOM Performed at [facility], [address] [place], [place] [address]    Special Requests   Final    NONE Performed at [facility], [address] [place], [place] [address]    Culture (A)  Final    <10,000 COLONIES/mL INSIGNIFICANT GROWTH Performed at [facility], [place] [address], [place], [place] [address]    Report Status 09/30/2020 FINAL  Final  Respiratory Panel by RT PCR (Flu A&B, Covid) - Nasopharyngeal Swab     Status: None   Collection Time: 09/29/20 12:10 AM   Specimen: Nasopharyngeal Swab  Result Value Ref Range Status   SARS Coronavirus 2 by RT PCR NEGATIVE NEGATIVE Final    Comment: (NOTE) SARS-CoV-2 target nucleic acids are NOT DETECTED.  The SARS-CoV-2 RNA is generally detectable in upper respiratoy specimens during the acute phase of infection. The lowest concentration of SARS-CoV-2 viral copies this assay can detect is 131 copies/mL. A negative result does not preclude SARS-Cov-2 infection and should not be used as the sole basis for treatment or other patient management decisions. A negative result may occur with  improper specimen collection/handling, submission of specimen other than nasopharyngeal swab, presence of viral mutation(s) within the areas targeted by this assay, and inadequate number of viral copies (<131 copies/mL). A negative result must be combined with clinical observations, patient history, and epidemiological information. The expected result is Negative.  Fact Sheet for Patients:  PinkCheek.be  Fact Sheet for Healthcare Providers:  GravelBags.it  This test is no t yet approved or cleared by the [place] FDA and  has been authorized for detection and/or diagnosis of SARS-CoV-2 by FDA under an Emergency Use Authorization (EUA). This EUA will  remain  in effect (meaning this test can be used) for the duration of the COVID-19 declaration under Section 564(b)(1) of the Act, 21 U.S.C. section 360bbb-3(b)(1), unless the authorization is terminated or revoked sooner.     Influenza A by PCR NEGATIVE NEGATIVE Final   Influenza B by PCR NEGATIVE NEGATIVE Final    Comment: (NOTE) The Xpert Xpress SARS-CoV-2/FLU/RSV assay is intended as an aid in  the diagnosis of influenza from Nasopharyngeal swab specimens and  should not be used as a sole basis for treatment. Nasal washings and  aspirates are unacceptable for Xpert Xpress SARS-CoV-2/FLU/RSV  testing.  Fact Sheet for Patients: PinkCheek.be  Fact Sheet for Healthcare Providers: GravelBags.it  This test is not yet approved or cleared by the [place] FDA and  has been authorized for detection and/or diagnosis of SARS-CoV-2 by  FDA under an Emergency Use Authorization (EUA). This EUA will remain  in effect (meaning this test can be used) for the duration of the  Covid-19 declaration under Section 564(b)(1) of the Act, 21  U.S.C. section 360bbb-3(b)(1), unless the authorization is  terminated or revoked. Performed at [facility], [address], [place], [place] [address]      Labs: Basic Metabolic Panel: Recent Labs  Lab 09/28/20 1539 09/28/20 1539 09/29/20 0533 09/30/20 7169  10/01/20 0505 10/02/20 0437 10/03/20 0428  NA 137   < > 140 140 143 141 140  K 3.0*   < > 4.1 3.6 4.0 3.1* 3.4*  CL 98   < > 105 107 110 111 113*  CO2 27   < > 26 27 26 24  21*  GLUCOSE 94   < > 94 117* 116* 123* 130*  BUN 12   < > 10 8 6* <5* <5*  CREATININE 1.02*   < > 1.07* 1.09* 1.07* 0.99 1.09*  CALCIUM 9.9   < > 9.2 8.2* 8.2* 7.9* 7.9*  MG 1.7  --   --   --   --   --  1.3*   < > = values in this interval not displayed.   Liver Function Tests: Recent Labs  Lab 09/29/20 0533 09/30/20 0610 10/01/20 0505  10/02/20 0437 10/03/20 0428  AST 19 20 18 27  35  ALT 17 15 15 19 25   ALKPHOS 49 39 39 41 40  BILITOT 1.2 0.8 0.8 0.6 0.6  PROT 6.8 5.5* 5.4* 5.3* 5.1*  ALBUMIN 3.8 3.1* 3.0* 2.9* 2.8*   Recent Labs  Lab 09/28/20 1539 09/29/20 0533  LIPASE 40 32   No results for input(s): AMMONIA in the last 168 hours. CBC: Recent Labs  Lab 09/29/20 0533 09/30/20 0610 10/01/20 0505 10/02/20 0437 10/03/20 0428  WBC 5.0 3.2* 3.6* 4.0 3.6*  NEUTROABS 3.2  --   --   --   --   HGB 11.7* 9.9* 10.1* 9.7* 10.0*  HCT 35.0* 29.2* 30.4* 28.6* 29.7*  MCV 80.6 80.9 82.2 80.1 81.4  PLT 209 168 192 185 177   Cardiac Enzymes: No results for input(s): CKTOTAL, CKMB, CKMBINDEX, TROPONINI in the last 168 hours. BNP: BNP (last 3 results) No results for input(s): BNP in the last 8760 hours.  ProBNP (last 3 results) No results for input(s): PROBNP in the last 8760 hours.  CBG: Recent Labs  Lab 10/02/20 1114 10/02/20 1744 10/02/20 2331 10/03/20 0621 10/03/20 1216  GLUCAP 98 151* 113* 128* 110*       Signed:  Irine Seal MD.  [facility] 10/03/2020, 5:17 PM

## 2020-10-03 NOTE — Care Management Important Message (Signed)
Important Message  Patient Details IM Letter given to the Patient Name: Alexis Molina MRN: [id number] Date of Birth: 1951-11-04   Medicare Important Message Given:  Yes     [name] 10/03/2020, 11:27 AM

## 2020-10-10 DIAGNOSIS — R7309 Other abnormal glucose: Secondary | ICD-10-CM | POA: Diagnosis not present

## 2020-10-10 DIAGNOSIS — K2900 Acute gastritis without bleeding: Secondary | ICD-10-CM | POA: Diagnosis not present

## 2020-10-10 DIAGNOSIS — R7303 Prediabetes: Secondary | ICD-10-CM | POA: Diagnosis not present

## 2020-10-10 DIAGNOSIS — D500 Iron deficiency anemia secondary to blood loss (chronic): Secondary | ICD-10-CM | POA: Diagnosis not present

## 2020-10-10 DIAGNOSIS — E778 Other disorders of glycoprotein metabolism: Secondary | ICD-10-CM | POA: Diagnosis not present

## 2020-10-10 DIAGNOSIS — E876 Hypokalemia: Secondary | ICD-10-CM | POA: Diagnosis not present

## 2020-11-12 DIAGNOSIS — Z1231 Encounter for screening mammogram for malignant neoplasm of breast: Secondary | ICD-10-CM | POA: Diagnosis not present

## 2020-11-17 DIAGNOSIS — K219 Gastro-esophageal reflux disease without esophagitis: Secondary | ICD-10-CM | POA: Diagnosis not present

## 2020-11-17 DIAGNOSIS — K529 Noninfective gastroenteritis and colitis, unspecified: Secondary | ICD-10-CM | POA: Diagnosis not present

## 2020-11-17 DIAGNOSIS — Z86010 Personal history of colonic polyps: Secondary | ICD-10-CM | POA: Diagnosis not present

## 2020-11-21 DIAGNOSIS — E785 Hyperlipidemia, unspecified: Secondary | ICD-10-CM | POA: Diagnosis not present

## 2020-11-21 DIAGNOSIS — E039 Hypothyroidism, unspecified: Secondary | ICD-10-CM | POA: Diagnosis not present

## 2020-11-21 DIAGNOSIS — R8290 Unspecified abnormal findings in urine: Secondary | ICD-10-CM | POA: Diagnosis not present

## 2020-11-21 DIAGNOSIS — R430 Anosmia: Secondary | ICD-10-CM | POA: Diagnosis not present

## 2020-11-21 DIAGNOSIS — I10 Essential (primary) hypertension: Secondary | ICD-10-CM | POA: Diagnosis not present

## 2020-11-21 DIAGNOSIS — M8588 Other specified disorders of bone density and structure, other site: Secondary | ICD-10-CM | POA: Diagnosis not present

## 2020-11-21 DIAGNOSIS — Z0000 Encounter for general adult medical examination without abnormal findings: Secondary | ICD-10-CM | POA: Diagnosis not present

## 2020-11-21 DIAGNOSIS — H6123 Impacted cerumen, bilateral: Secondary | ICD-10-CM | POA: Diagnosis not present

## 2020-11-21 DIAGNOSIS — Z8719 Personal history of other diseases of the digestive system: Secondary | ICD-10-CM | POA: Diagnosis not present

## 2020-11-21 DIAGNOSIS — E1169 Type 2 diabetes mellitus with other specified complication: Secondary | ICD-10-CM | POA: Diagnosis not present

## 2020-11-21 DIAGNOSIS — M25511 Pain in right shoulder: Secondary | ICD-10-CM | POA: Diagnosis not present

## 2020-11-21 DIAGNOSIS — Z86010 Personal history of colonic polyps: Secondary | ICD-10-CM | POA: Diagnosis not present

## 2020-11-23 DIAGNOSIS — Z01818 Encounter for other preprocedural examination: Secondary | ICD-10-CM | POA: Diagnosis not present

## 2020-11-23 DIAGNOSIS — Z86010 Personal history of colonic polyps: Secondary | ICD-10-CM | POA: Diagnosis not present

## 2020-11-23 DIAGNOSIS — K635 Polyp of colon: Secondary | ICD-10-CM | POA: Diagnosis not present

## 2020-11-23 DIAGNOSIS — Z1211 Encounter for screening for malignant neoplasm of colon: Secondary | ICD-10-CM | POA: Diagnosis not present

## 2020-11-27 DIAGNOSIS — M7581 Other shoulder lesions, right shoulder: Secondary | ICD-10-CM | POA: Diagnosis not present

## 2020-12-03 DIAGNOSIS — E119 Type 2 diabetes mellitus without complications: Secondary | ICD-10-CM | POA: Diagnosis not present

## 2020-12-03 DIAGNOSIS — E1169 Type 2 diabetes mellitus with other specified complication: Secondary | ICD-10-CM | POA: Diagnosis not present

## 2020-12-03 DIAGNOSIS — E1165 Type 2 diabetes mellitus with hyperglycemia: Secondary | ICD-10-CM | POA: Diagnosis not present

## 2020-12-03 DIAGNOSIS — E039 Hypothyroidism, unspecified: Secondary | ICD-10-CM | POA: Diagnosis not present

## 2020-12-03 DIAGNOSIS — D500 Iron deficiency anemia secondary to blood loss (chronic): Secondary | ICD-10-CM | POA: Diagnosis not present

## 2020-12-03 DIAGNOSIS — E785 Hyperlipidemia, unspecified: Secondary | ICD-10-CM | POA: Diagnosis not present

## 2020-12-03 DIAGNOSIS — I10 Essential (primary) hypertension: Secondary | ICD-10-CM | POA: Diagnosis not present

## 2020-12-03 DIAGNOSIS — E782 Mixed hyperlipidemia: Secondary | ICD-10-CM | POA: Diagnosis not present

## 2020-12-11 DIAGNOSIS — K529 Noninfective gastroenteritis and colitis, unspecified: Secondary | ICD-10-CM | POA: Diagnosis not present

## 2020-12-18 DIAGNOSIS — J3489 Other specified disorders of nose and nasal sinuses: Secondary | ICD-10-CM | POA: Diagnosis not present

## 2020-12-18 DIAGNOSIS — R430 Anosmia: Secondary | ICD-10-CM

## 2020-12-18 DIAGNOSIS — R432 Parageusia: Secondary | ICD-10-CM

## 2020-12-18 DIAGNOSIS — J342 Deviated nasal septum: Secondary | ICD-10-CM | POA: Diagnosis not present

## 2020-12-18 DIAGNOSIS — R6889 Other general symptoms and signs: Secondary | ICD-10-CM | POA: Diagnosis not present

## 2020-12-18 HISTORY — DX: Parageusia: R43.2

## 2020-12-18 HISTORY — DX: Anosmia: R43.0

## 2020-12-19 DIAGNOSIS — R6889 Other general symptoms and signs: Secondary | ICD-10-CM | POA: Diagnosis not present

## 2020-12-19 DIAGNOSIS — N3941 Urge incontinence: Secondary | ICD-10-CM | POA: Diagnosis not present

## 2020-12-19 DIAGNOSIS — Z4549 Encounter for adjustment and management of other implanted nervous system device: Secondary | ICD-10-CM | POA: Diagnosis not present

## 2020-12-25 DIAGNOSIS — R6889 Other general symptoms and signs: Secondary | ICD-10-CM | POA: Diagnosis not present

## 2020-12-26 DIAGNOSIS — Z86010 Personal history of colonic polyps: Secondary | ICD-10-CM | POA: Diagnosis not present

## 2020-12-26 DIAGNOSIS — R1319 Other dysphagia: Secondary | ICD-10-CM | POA: Diagnosis not present

## 2020-12-26 DIAGNOSIS — K529 Noninfective gastroenteritis and colitis, unspecified: Secondary | ICD-10-CM | POA: Diagnosis not present

## 2021-01-02 DIAGNOSIS — R6889 Other general symptoms and signs: Secondary | ICD-10-CM | POA: Diagnosis not present

## 2021-01-03 DIAGNOSIS — R8290 Unspecified abnormal findings in urine: Secondary | ICD-10-CM | POA: Diagnosis not present

## 2021-01-03 DIAGNOSIS — E039 Hypothyroidism, unspecified: Secondary | ICD-10-CM | POA: Diagnosis not present

## 2021-01-03 DIAGNOSIS — E1169 Type 2 diabetes mellitus with other specified complication: Secondary | ICD-10-CM | POA: Diagnosis not present

## 2021-01-03 DIAGNOSIS — I10 Essential (primary) hypertension: Secondary | ICD-10-CM | POA: Diagnosis not present

## 2021-01-03 DIAGNOSIS — E1165 Type 2 diabetes mellitus with hyperglycemia: Secondary | ICD-10-CM | POA: Diagnosis not present

## 2021-01-03 DIAGNOSIS — Z0000 Encounter for general adult medical examination without abnormal findings: Secondary | ICD-10-CM | POA: Diagnosis not present

## 2021-01-03 DIAGNOSIS — R6889 Other general symptoms and signs: Secondary | ICD-10-CM | POA: Diagnosis not present

## 2021-01-03 DIAGNOSIS — Z86010 Personal history of colonic polyps: Secondary | ICD-10-CM | POA: Diagnosis not present

## 2021-01-03 DIAGNOSIS — E785 Hyperlipidemia, unspecified: Secondary | ICD-10-CM | POA: Diagnosis not present

## 2021-01-03 DIAGNOSIS — M8588 Other specified disorders of bone density and structure, other site: Secondary | ICD-10-CM | POA: Diagnosis not present

## 2021-01-04 DIAGNOSIS — R6889 Other general symptoms and signs: Secondary | ICD-10-CM | POA: Diagnosis not present

## 2021-01-04 DIAGNOSIS — R1319 Other dysphagia: Secondary | ICD-10-CM | POA: Diagnosis not present

## 2021-01-04 DIAGNOSIS — Z1211 Encounter for screening for malignant neoplasm of colon: Secondary | ICD-10-CM | POA: Diagnosis not present

## 2021-01-04 DIAGNOSIS — Z01818 Encounter for other preprocedural examination: Secondary | ICD-10-CM | POA: Diagnosis not present

## 2021-01-07 ENCOUNTER — Other Ambulatory Visit (HOSPITAL_BASED_OUTPATIENT_CLINIC_OR_DEPARTMENT_OTHER): Payer: Self-pay | Admitting: Family Medicine

## 2021-01-07 DIAGNOSIS — R6889 Other general symptoms and signs: Secondary | ICD-10-CM | POA: Diagnosis not present

## 2021-01-07 DIAGNOSIS — N289 Disorder of kidney and ureter, unspecified: Secondary | ICD-10-CM

## 2021-01-07 DIAGNOSIS — N3941 Urge incontinence: Secondary | ICD-10-CM | POA: Diagnosis not present

## 2021-01-09 DIAGNOSIS — K2970 Gastritis, unspecified, without bleeding: Secondary | ICD-10-CM | POA: Diagnosis not present

## 2021-01-09 DIAGNOSIS — A048 Other specified bacterial intestinal infections: Secondary | ICD-10-CM | POA: Diagnosis not present

## 2021-01-09 DIAGNOSIS — D132 Benign neoplasm of duodenum: Secondary | ICD-10-CM | POA: Diagnosis not present

## 2021-01-10 ENCOUNTER — Ambulatory Visit (HOSPITAL_BASED_OUTPATIENT_CLINIC_OR_DEPARTMENT_OTHER)
Admission: RE | Admit: 2021-01-10 | Discharge: 2021-01-10 | Disposition: A | Discharge status: Home / Self Care | Payer: Medicare HMO | Source: Ambulatory Visit | Attending: Family Medicine | Admitting: Family Medicine

## 2021-01-10 ENCOUNTER — Other Ambulatory Visit: Payer: Self-pay

## 2021-01-10 DIAGNOSIS — N289 Disorder of kidney and ureter, unspecified: Secondary | ICD-10-CM | POA: Insufficient documentation

## 2021-01-10 DIAGNOSIS — R944 Abnormal results of kidney function studies: Secondary | ICD-10-CM | POA: Diagnosis not present

## 2021-01-10 DIAGNOSIS — R6889 Other general symptoms and signs: Secondary | ICD-10-CM | POA: Diagnosis not present

## 2021-01-10 DIAGNOSIS — K200 Eosinophilic esophagitis: Secondary | ICD-10-CM | POA: Diagnosis not present

## 2021-01-14 DIAGNOSIS — Z780 Asymptomatic menopausal state: Secondary | ICD-10-CM | POA: Diagnosis not present

## 2021-01-14 DIAGNOSIS — R6889 Other general symptoms and signs: Secondary | ICD-10-CM | POA: Diagnosis not present

## 2021-01-28 DIAGNOSIS — E1165 Type 2 diabetes mellitus with hyperglycemia: Secondary | ICD-10-CM | POA: Diagnosis not present

## 2021-01-31 DIAGNOSIS — I129 Hypertensive chronic kidney disease with stage 1 through stage 4 chronic kidney disease, or unspecified chronic kidney disease: Secondary | ICD-10-CM | POA: Diagnosis not present

## 2021-01-31 DIAGNOSIS — D631 Anemia in chronic kidney disease: Secondary | ICD-10-CM | POA: Diagnosis not present

## 2021-01-31 DIAGNOSIS — E1165 Type 2 diabetes mellitus with hyperglycemia: Secondary | ICD-10-CM | POA: Diagnosis not present

## 2021-01-31 DIAGNOSIS — E782 Mixed hyperlipidemia: Secondary | ICD-10-CM | POA: Diagnosis not present

## 2021-01-31 DIAGNOSIS — E785 Hyperlipidemia, unspecified: Secondary | ICD-10-CM | POA: Diagnosis not present

## 2021-01-31 DIAGNOSIS — I10 Essential (primary) hypertension: Secondary | ICD-10-CM | POA: Diagnosis not present

## 2021-01-31 DIAGNOSIS — E1169 Type 2 diabetes mellitus with other specified complication: Secondary | ICD-10-CM | POA: Diagnosis not present

## 2021-01-31 DIAGNOSIS — D500 Iron deficiency anemia secondary to blood loss (chronic): Secondary | ICD-10-CM | POA: Diagnosis not present

## 2021-01-31 DIAGNOSIS — E1122 Type 2 diabetes mellitus with diabetic chronic kidney disease: Secondary | ICD-10-CM | POA: Diagnosis not present

## 2021-01-31 DIAGNOSIS — E119 Type 2 diabetes mellitus without complications: Secondary | ICD-10-CM | POA: Diagnosis not present

## 2021-01-31 DIAGNOSIS — N183 Chronic kidney disease, stage 3 unspecified: Secondary | ICD-10-CM | POA: Diagnosis not present

## 2021-01-31 DIAGNOSIS — K219 Gastro-esophageal reflux disease without esophagitis: Secondary | ICD-10-CM | POA: Diagnosis not present

## 2021-01-31 DIAGNOSIS — E039 Hypothyroidism, unspecified: Secondary | ICD-10-CM | POA: Diagnosis not present

## 2021-01-31 DIAGNOSIS — D509 Iron deficiency anemia, unspecified: Secondary | ICD-10-CM | POA: Diagnosis not present

## 2021-02-04 DIAGNOSIS — R82998 Other abnormal findings in urine: Secondary | ICD-10-CM | POA: Diagnosis not present

## 2021-02-04 DIAGNOSIS — R6889 Other general symptoms and signs: Secondary | ICD-10-CM | POA: Diagnosis not present

## 2021-02-04 DIAGNOSIS — K5900 Constipation, unspecified: Secondary | ICD-10-CM | POA: Diagnosis not present

## 2021-02-04 DIAGNOSIS — N3941 Urge incontinence: Secondary | ICD-10-CM | POA: Diagnosis not present

## 2021-02-06 DIAGNOSIS — K209 Esophagitis, unspecified without bleeding: Secondary | ICD-10-CM | POA: Diagnosis not present

## 2021-02-06 DIAGNOSIS — D132 Benign neoplasm of duodenum: Secondary | ICD-10-CM | POA: Diagnosis not present

## 2021-02-15 DIAGNOSIS — Z1211 Encounter for screening for malignant neoplasm of colon: Secondary | ICD-10-CM | POA: Diagnosis not present

## 2021-02-15 DIAGNOSIS — D132 Benign neoplasm of duodenum: Secondary | ICD-10-CM | POA: Diagnosis not present

## 2021-02-15 DIAGNOSIS — Z01818 Encounter for other preprocedural examination: Secondary | ICD-10-CM | POA: Diagnosis not present

## 2021-02-21 DIAGNOSIS — K219 Gastro-esophageal reflux disease without esophagitis: Secondary | ICD-10-CM | POA: Diagnosis not present

## 2021-02-21 DIAGNOSIS — E782 Mixed hyperlipidemia: Secondary | ICD-10-CM | POA: Diagnosis not present

## 2021-02-21 DIAGNOSIS — E039 Hypothyroidism, unspecified: Secondary | ICD-10-CM | POA: Diagnosis not present

## 2021-02-21 DIAGNOSIS — D500 Iron deficiency anemia secondary to blood loss (chronic): Secondary | ICD-10-CM | POA: Diagnosis not present

## 2021-02-21 DIAGNOSIS — I10 Essential (primary) hypertension: Secondary | ICD-10-CM | POA: Diagnosis not present

## 2021-02-21 DIAGNOSIS — E1165 Type 2 diabetes mellitus with hyperglycemia: Secondary | ICD-10-CM | POA: Diagnosis not present

## 2021-02-21 DIAGNOSIS — E1169 Type 2 diabetes mellitus with other specified complication: Secondary | ICD-10-CM | POA: Diagnosis not present

## 2021-02-21 DIAGNOSIS — D132 Benign neoplasm of duodenum: Secondary | ICD-10-CM | POA: Diagnosis not present

## 2021-03-19 DIAGNOSIS — R152 Fecal urgency: Secondary | ICD-10-CM | POA: Diagnosis not present

## 2021-03-19 DIAGNOSIS — N3941 Urge incontinence: Secondary | ICD-10-CM | POA: Diagnosis not present

## 2021-03-19 DIAGNOSIS — R159 Full incontinence of feces: Secondary | ICD-10-CM | POA: Diagnosis not present

## 2021-04-26 DIAGNOSIS — E119 Type 2 diabetes mellitus without complications: Secondary | ICD-10-CM | POA: Diagnosis not present

## 2021-04-26 DIAGNOSIS — E1169 Type 2 diabetes mellitus with other specified complication: Secondary | ICD-10-CM | POA: Diagnosis not present

## 2021-04-26 DIAGNOSIS — E1165 Type 2 diabetes mellitus with hyperglycemia: Secondary | ICD-10-CM | POA: Diagnosis not present

## 2021-04-26 DIAGNOSIS — D500 Iron deficiency anemia secondary to blood loss (chronic): Secondary | ICD-10-CM | POA: Diagnosis not present

## 2021-04-26 DIAGNOSIS — I10 Essential (primary) hypertension: Secondary | ICD-10-CM | POA: Diagnosis not present

## 2021-04-26 DIAGNOSIS — K219 Gastro-esophageal reflux disease without esophagitis: Secondary | ICD-10-CM | POA: Diagnosis not present

## 2021-04-26 DIAGNOSIS — E782 Mixed hyperlipidemia: Secondary | ICD-10-CM | POA: Diagnosis not present

## 2021-04-26 DIAGNOSIS — E039 Hypothyroidism, unspecified: Secondary | ICD-10-CM | POA: Diagnosis not present

## 2021-06-02 DIAGNOSIS — E1169 Type 2 diabetes mellitus with other specified complication: Secondary | ICD-10-CM | POA: Diagnosis not present

## 2021-06-02 DIAGNOSIS — E119 Type 2 diabetes mellitus without complications: Secondary | ICD-10-CM | POA: Diagnosis not present

## 2021-06-02 DIAGNOSIS — K219 Gastro-esophageal reflux disease without esophagitis: Secondary | ICD-10-CM | POA: Diagnosis not present

## 2021-06-02 DIAGNOSIS — D500 Iron deficiency anemia secondary to blood loss (chronic): Secondary | ICD-10-CM | POA: Diagnosis not present

## 2021-06-02 DIAGNOSIS — E039 Hypothyroidism, unspecified: Secondary | ICD-10-CM | POA: Diagnosis not present

## 2021-06-02 DIAGNOSIS — E1165 Type 2 diabetes mellitus with hyperglycemia: Secondary | ICD-10-CM | POA: Diagnosis not present

## 2021-06-02 DIAGNOSIS — E782 Mixed hyperlipidemia: Secondary | ICD-10-CM | POA: Diagnosis not present

## 2021-06-02 DIAGNOSIS — I10 Essential (primary) hypertension: Secondary | ICD-10-CM | POA: Diagnosis not present

## 2021-06-20 DIAGNOSIS — E119 Type 2 diabetes mellitus without complications: Secondary | ICD-10-CM | POA: Diagnosis not present

## 2021-06-20 DIAGNOSIS — D132 Benign neoplasm of duodenum: Secondary | ICD-10-CM | POA: Diagnosis not present

## 2021-06-20 DIAGNOSIS — K838 Other specified diseases of biliary tract: Secondary | ICD-10-CM | POA: Diagnosis not present

## 2021-07-17 DIAGNOSIS — R079 Chest pain, unspecified: Secondary | ICD-10-CM | POA: Diagnosis not present

## 2021-07-18 ENCOUNTER — Telehealth: Payer: Self-pay | Admitting: Interventional Cardiology

## 2021-07-18 NOTE — Telephone Encounter (Signed)
Calling to get patient seen for chest pain

## 2021-07-18 NOTE — Telephone Encounter (Signed)
Spoke with Pamala Hurry from [employer] who states pt was seen yesterday in their practice with worsening intermittent CP since July 1,2022.  Normal EKG in the office.  Pamala Hurry is requesting an appointment for pt as she placed a referral for pt this morning to [facility].  Pt previously seen by Dr Irish Lack in 2021.  Pamala Hurry advised appointment has already been scheduled to see Dr Cheron Every 07/23/2021.  Barbara verbalizes understanding and thanked Therapist, sports for assistance.

## 2021-07-22 ENCOUNTER — Other Ambulatory Visit: Payer: Self-pay

## 2021-07-22 DIAGNOSIS — K449 Diaphragmatic hernia without obstruction or gangrene: Secondary | ICD-10-CM | POA: Insufficient documentation

## 2021-07-22 DIAGNOSIS — Z8719 Personal history of other diseases of the digestive system: Secondary | ICD-10-CM

## 2021-07-22 DIAGNOSIS — R600 Localized edema: Secondary | ICD-10-CM | POA: Insufficient documentation

## 2021-07-22 DIAGNOSIS — H269 Unspecified cataract: Secondary | ICD-10-CM | POA: Insufficient documentation

## 2021-07-22 DIAGNOSIS — K2900 Acute gastritis without bleeding: Secondary | ICD-10-CM | POA: Insufficient documentation

## 2021-07-22 DIAGNOSIS — W5911XA Bitten by nonvenomous snake, initial encounter: Secondary | ICD-10-CM | POA: Insufficient documentation

## 2021-07-22 DIAGNOSIS — H43391 Other vitreous opacities, right eye: Secondary | ICD-10-CM | POA: Insufficient documentation

## 2021-07-22 DIAGNOSIS — M8588 Other specified disorders of bone density and structure, other site: Secondary | ICD-10-CM | POA: Insufficient documentation

## 2021-07-22 DIAGNOSIS — Z6830 Body mass index (BMI) 30.0-30.9, adult: Secondary | ICD-10-CM

## 2021-07-22 DIAGNOSIS — E785 Hyperlipidemia, unspecified: Secondary | ICD-10-CM | POA: Insufficient documentation

## 2021-07-22 DIAGNOSIS — E669 Obesity, unspecified: Secondary | ICD-10-CM | POA: Insufficient documentation

## 2021-07-22 DIAGNOSIS — Z8371 Family history of colonic polyps: Secondary | ICD-10-CM

## 2021-07-22 DIAGNOSIS — D1803 Hemangioma of intra-abdominal structures: Secondary | ICD-10-CM

## 2021-07-22 DIAGNOSIS — N762 Acute vulvitis: Secondary | ICD-10-CM

## 2021-07-22 DIAGNOSIS — E1165 Type 2 diabetes mellitus with hyperglycemia: Secondary | ICD-10-CM

## 2021-07-22 DIAGNOSIS — D500 Iron deficiency anemia secondary to blood loss (chronic): Secondary | ICD-10-CM

## 2021-07-22 DIAGNOSIS — K580 Irritable bowel syndrome with diarrhea: Secondary | ICD-10-CM

## 2021-07-22 DIAGNOSIS — F339 Major depressive disorder, recurrent, unspecified: Secondary | ICD-10-CM

## 2021-07-22 DIAGNOSIS — E778 Other disorders of glycoprotein metabolism: Secondary | ICD-10-CM

## 2021-07-22 DIAGNOSIS — K219 Gastro-esophageal reflux disease without esophagitis: Secondary | ICD-10-CM

## 2021-07-22 DIAGNOSIS — D126 Benign neoplasm of colon, unspecified: Secondary | ICD-10-CM | POA: Insufficient documentation

## 2021-07-22 DIAGNOSIS — J309 Allergic rhinitis, unspecified: Secondary | ICD-10-CM | POA: Insufficient documentation

## 2021-07-22 DIAGNOSIS — F419 Anxiety disorder, unspecified: Secondary | ICD-10-CM | POA: Insufficient documentation

## 2021-07-22 DIAGNOSIS — Z83719 Family history of colon polyps, unspecified: Secondary | ICD-10-CM | POA: Insufficient documentation

## 2021-07-22 DIAGNOSIS — Z9109 Other allergy status, other than to drugs and biological substances: Secondary | ICD-10-CM

## 2021-07-22 DIAGNOSIS — E559 Vitamin D deficiency, unspecified: Secondary | ICD-10-CM | POA: Insufficient documentation

## 2021-07-22 DIAGNOSIS — K760 Fatty (change of) liver, not elsewhere classified: Secondary | ICD-10-CM | POA: Insufficient documentation

## 2021-07-22 DIAGNOSIS — I7300 Raynaud's syndrome without gangrene: Secondary | ICD-10-CM

## 2021-07-22 DIAGNOSIS — R5383 Other fatigue: Secondary | ICD-10-CM | POA: Insufficient documentation

## 2021-07-22 DIAGNOSIS — R7303 Prediabetes: Secondary | ICD-10-CM

## 2021-07-22 DIAGNOSIS — R609 Edema, unspecified: Secondary | ICD-10-CM | POA: Insufficient documentation

## 2021-07-22 DIAGNOSIS — N898 Other specified noninflammatory disorders of vagina: Secondary | ICD-10-CM

## 2021-07-22 HISTORY — DX: Major depressive disorder, recurrent, unspecified: F33.9

## 2021-07-22 HISTORY — DX: Personal history of other diseases of the digestive system: Z87.19

## 2021-07-22 HISTORY — DX: Acute gastritis without bleeding: K29.00

## 2021-07-22 HISTORY — DX: Raynaud's syndrome without gangrene: I73.00

## 2021-07-22 HISTORY — DX: Prediabetes: R73.03

## 2021-07-22 HISTORY — DX: Hemangioma of intra-abdominal structures: D18.03

## 2021-07-22 HISTORY — DX: Iron deficiency anemia secondary to blood loss (chronic): D50.0

## 2021-07-22 HISTORY — DX: Type 2 diabetes mellitus with hyperglycemia: E11.65

## 2021-07-22 HISTORY — DX: Other disorders of glycoprotein metabolism: E77.8

## 2021-07-22 HISTORY — DX: Gastro-esophageal reflux disease without esophagitis: K21.9

## 2021-07-22 HISTORY — DX: Other allergy status, other than to drugs and biological substances: Z91.09

## 2021-07-22 HISTORY — DX: Other specified noninflammatory disorders of vagina: N89.8

## 2021-07-22 HISTORY — DX: Family history of colonic polyps: Z83.71

## 2021-07-22 HISTORY — DX: Irritable bowel syndrome with diarrhea: K58.0

## 2021-07-22 HISTORY — DX: Body mass index (BMI) 30.0-30.9, adult: Z68.30

## 2021-07-22 HISTORY — DX: Acute vulvitis: N76.2

## 2021-07-22 HISTORY — DX: Other specified disorders of bone density and structure, other site: M85.88

## 2021-07-22 HISTORY — DX: Family history of colon polyps, unspecified: Z83.719

## 2021-07-23 ENCOUNTER — Other Ambulatory Visit: Payer: Self-pay

## 2021-07-23 ENCOUNTER — Encounter: Payer: Self-pay | Admitting: Cardiology

## 2021-07-23 ENCOUNTER — Ambulatory Visit (INDEPENDENT_AMBULATORY_CARE_PROVIDER_SITE_OTHER): Payer: Medicare HMO | Admitting: Cardiology

## 2021-07-23 VITALS — BP 112/68 | HR 69 | Ht 62.0 in | Wt 144.2 lb

## 2021-07-23 DIAGNOSIS — E782 Mixed hyperlipidemia: Secondary | ICD-10-CM | POA: Insufficient documentation

## 2021-07-23 DIAGNOSIS — R079 Chest pain, unspecified: Secondary | ICD-10-CM | POA: Insufficient documentation

## 2021-07-23 DIAGNOSIS — R072 Precordial pain: Secondary | ICD-10-CM | POA: Diagnosis not present

## 2021-07-23 DIAGNOSIS — R011 Cardiac murmur, unspecified: Secondary | ICD-10-CM | POA: Diagnosis not present

## 2021-07-23 DIAGNOSIS — I10 Essential (primary) hypertension: Secondary | ICD-10-CM

## 2021-07-23 HISTORY — DX: Chest pain, unspecified: R07.9

## 2021-07-23 HISTORY — DX: Mixed hyperlipidemia: E78.2

## 2021-07-23 HISTORY — DX: Essential (primary) hypertension: I10

## 2021-07-23 MED ORDER — NITROGLYCERIN 0.4 MG SL SUBL: 0.4000 mg | SUBLINGUAL_TABLET | SUBLINGUAL | 6 refills | Status: DC | PRN

## 2021-07-23 NOTE — Patient Instructions (Signed)

## 2021-07-23 NOTE — Progress Notes (Signed)
Cardiology Office Note:    Date:  07/23/2021   ID:  Alexis Molina, DOB 1951-10-07, MRN [id number]  PCP:  Kristen Loader, FNP  Cardiologist:  Jenean Lindau, MD   Referring MD: Orvan July, NP    ASSESSMENT:    1. Precordial pain   2. Murmur, cardiac   3. Essential hypertension   4. Chest pain of uncertain etiology   5. Mixed dyslipidemia    PLAN:    In order of problems listed above:  Primary prevention stressed with the patient.  Importance of compliance with diet medication stressed and she vocalized understanding. Essential hypertension: Blood pressure stable and diet was emphasized.  Lifestyle modification urged. Mixed dyslipidemia: On statin therapy and managed by primary care.  Diet emphasized. Cardiac murmur: Echocardiogram will be done to assess murmur heard on auscultation. Chest pain: Atypical etiology however in view of risk factors we will do a Lexiscan stress Cardiolite to assess for any objective evidence of obstructive coronary artery disease.  She is agreeable. Sublingual nitroglycerin prescription was sent, its protocol and 911 protocol explained and the patient vocalized understanding questions were answered to the patient's satisfaction.  She knows to go to the nearest emergency room for any concerning symptoms. Patient will be seen in follow-up appointment in 6 months or earlier if the patient has any concerns    Medication Adjustments/Labs and Tests Ordered: Current medicines are reviewed at length with the patient today.  Concerns regarding medicines are outlined above.  Orders Placed This Encounter  Procedures   MYOCARDIAL PERFUSION IMAGING   EKG 12-Lead   ECHOCARDIOGRAM COMPLETE   Meds ordered this encounter  Medications   nitroGLYCERIN (NITROSTAT) 0.4 MG SL tablet    Sig: Place 1 tablet (0.4 mg total) under the tongue every 5 (five) minutes as needed.    Dispense:  25 tablet    Refill:  6     History of Present Illness:    Alexis Molina is a 70 y.o. female who is being seen today for the evaluation of chest pain at the request of [name], NP.  Patient is a pleasant 70 year old female.  She has past medical history of hypertension and dyslipidemia.  She tells me that she is [facility] and [facility]  Occasionally she will have chest tightness.  No radiation to the neck or to the arms.  No orthopnea PND she has no chest symptoms with this chest pain.  At other times she exercises otherwise without any significant issues.  At the time of my evaluation, the patient is alert awake oriented and in no distress.  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[place])    Peripheral edema    Peripheral edema    after knee surgery   Raynaud's syndrome without gangrene  Snake bite    SOB (shortness of breath)    Thyroid disease    Tubular adenoma of colon    Vitamin D deficiency    Vitreous floaters of right eye     Past Surgical History:  Procedure Laterality Date   ABDOMINAL HYSTERECTOMY     BIOPSY  10/02/2020   Procedure: BIOPSY;  Surgeon: Wilford Corner, MD;  Location: [facility];  Service: Endoscopy;;   CARDIAC CATHETERIZATION N/A 07/30/2015   Procedure: Left Heart Cath and Coronary Angiography;  Surgeon: Jettie Booze, MD;  Location: [facility];  Service: Cardiovascular;  Laterality: N/A;   CARPAL TUNNEL RELEASE     CHOLECYSTECTOMY     ESOPHAGOGASTRODUODENOSCOPY (EGD) WITH PROPOFOL  N/A 10/02/2020   Procedure: ESOPHAGOGASTRODUODENOSCOPY (EGD) WITH PROPOFOL;  Surgeon: Wilford Corner, MD;  Location: [facility];  Service: Endoscopy;  Laterality: N/A;   KNEE SURGERY     ROTATOR CUFF REPAIR Left    TUBAL LIGATION      Current Medications: Current Meds  Medication Sig   albuterol (VENTOLIN HFA) 108 (90 Base) MCG/ACT inhaler Inhale 2 puffs into the lungs every 4 (four) hours as needed for wheezing or shortness of breath.   diphenhydrAMINE (BENADRYL) 25 MG tablet Take 25 mg by mouth every 6 (six) hours as needed for itching. Itching   famotidine (PEPCID) 20 MG tablet Take 1 tablet by mouth daily.   fluticasone (FLONASE) 50 MCG/ACT nasal spray Place 2 sprays into both nostrils at bedtime.   levothyroxine (SYNTHROID) 75 MCG tablet Take 1 tablet by mouth daily.   lisinopril (PRINIVIL,ZESTRIL) 20 MG tablet Take 20 mg by mouth daily.   Multiple Vitamin (MULTIVITAMIN WITH MINERALS) TABS tablet Take 1 tablet by mouth every other day. Gummy bears/ Unknown strength   nitroGLYCERIN (NITROSTAT) 0.4 MG SL tablet Place 1 tablet (0.4 mg total) under the tongue every 5 (five) minutes as needed.   pantoprazole (PROTONIX) 40 MG tablet Take 1 tablet by mouth daily.   rosuvastatin (CRESTOR) 20 MG tablet Take 20 mg by mouth daily.   triamterene-hydrochlorothiazide (MAXZIDE) 75-50 MG tablet Take 1 tablet by mouth daily.     Allergies:   Darvocet [propoxyphene n-acetaminophen], Orange fruit [citrus], Vicodin [hydrocodone-acetaminophen], Dust mite extract, Betamethasone, Celecoxib, Metformin, Morphine, Oxycodone-acetaminophen, Glimepiride, Penicillins, and Sulfa antibiotics   Social History   Socioeconomic History   Marital status: Widowed    Spouse name: Not on file   Number of children: Not on file   Years of education: Not on file   Highest education level: Not on file  Occupational History   Not on file  Tobacco Use   Smoking status: Former    Types: Cigarettes    Quit date:  12/09/1987    Years since quitting: 33.6   Smokeless tobacco: Never  Vaping Use   Vaping Use: Never used  Substance and Sexual Activity   Alcohol use: No   Drug use: No   Sexual activity: Not on file  Other Topics Concern   Not on file  Social History Narrative   Not on file   Social Determinants of Health   Financial Resource Strain: Not on file  Food Insecurity: Not on file  Transportation Needs: Not on file  Physical Activity: Not on file  Stress: Not on file  Social Connections: Not on file     Family History: The patient's family history includes Alzheimer's disease in her mother; Arthritis in her father; Diabetes in her mother; Heart failure in her father; Hypertension  in her father and mother; Lung cancer in her brother; Ulcers in her father.  ROS:   Please see the history of present illness.    All other systems reviewed and are negative.  EKGs/Labs/Other Studies Reviewed:    The following studies were reviewed today: EKG reveals sinus rhythm and nonspecific ST changes   Recent Labs: 10/03/2020: ALT 25; BUN <5; Creatinine, Ser 1.09; Hemoglobin 10.0; Magnesium 1.3; Platelets 177; Potassium 3.4; Sodium 140  Recent Lipid Panel No results found for: CHOL, TRIG, HDL, CHOLHDL, VLDL, LDLCALC, LDLDIRECT  Physical Exam:    VS:  BP 112/68 (BP Location: Left Arm, Patient Position: Sitting)   Pulse 69   Ht '5\' 2"'$  (1.575 m)   Wt 144 lb 3.2 oz (65.4 kg)   SpO2 99%   BMI 26.37 kg/m     Wt Readings from Last 3 Encounters:  07/23/21 144 lb 3.2 oz (65.4 kg)  10/02/20 155 lb (70.3 kg)  01/22/20 150 lb (68 kg)     GEN: Patient is in no acute distress HEENT: Normal NECK: No JVD; No carotid bruits LYMPHATICS: No lymphadenopathy CARDIAC: S1 S2 regular, 2/6 systolic murmur at the apex. RESPIRATORY:  Clear to auscultation without rales, wheezing or rhonchi  ABDOMEN: Soft, non-tender, non-distended MUSCULOSKELETAL:  No edema; No deformity  SKIN: Warm and dry NEUROLOGIC:   Alert and oriented x 3 PSYCHIATRIC:  Normal affect    Signed, Jenean Lindau, MD  07/23/2021 10:19 AM    [facility]

## 2021-07-31 ENCOUNTER — Telehealth (HOSPITAL_COMMUNITY): Payer: Self-pay | Admitting: *Deleted

## 2021-07-31 NOTE — Telephone Encounter (Signed)
Left message on voicemail per DPR in reference to upcoming appointment scheduled on 8/31/22with detailed instructions given per Myocardial Perfusion Study Information Sheet for the test. LM to arrive 15 minutes early, and that it is imperative to arrive on time for appointment to keep from having the test rescheduled. If you need to cancel or reschedule your appointment, please call the office within 24 hours of your appointment. Failure to do so may result in a cancellation of your appointment, and a $50 no show fee. Phone number given for call back for any questions. Kirstie Peri

## 2021-08-06 DIAGNOSIS — I10 Essential (primary) hypertension: Secondary | ICD-10-CM | POA: Diagnosis not present

## 2021-08-06 DIAGNOSIS — E039 Hypothyroidism, unspecified: Secondary | ICD-10-CM | POA: Diagnosis not present

## 2021-08-06 DIAGNOSIS — E1165 Type 2 diabetes mellitus with hyperglycemia: Secondary | ICD-10-CM | POA: Diagnosis not present

## 2021-08-06 DIAGNOSIS — E1169 Type 2 diabetes mellitus with other specified complication: Secondary | ICD-10-CM | POA: Diagnosis not present

## 2021-08-06 DIAGNOSIS — E119 Type 2 diabetes mellitus without complications: Secondary | ICD-10-CM | POA: Diagnosis not present

## 2021-08-06 DIAGNOSIS — D500 Iron deficiency anemia secondary to blood loss (chronic): Secondary | ICD-10-CM | POA: Diagnosis not present

## 2021-08-06 DIAGNOSIS — K219 Gastro-esophageal reflux disease without esophagitis: Secondary | ICD-10-CM | POA: Diagnosis not present

## 2021-08-06 DIAGNOSIS — E785 Hyperlipidemia, unspecified: Secondary | ICD-10-CM | POA: Diagnosis not present

## 2021-08-06 DIAGNOSIS — E782 Mixed hyperlipidemia: Secondary | ICD-10-CM | POA: Diagnosis not present

## 2021-08-07 ENCOUNTER — Other Ambulatory Visit: Payer: Self-pay

## 2021-08-07 ENCOUNTER — Ambulatory Visit (INDEPENDENT_AMBULATORY_CARE_PROVIDER_SITE_OTHER): Payer: Medicare HMO

## 2021-08-07 DIAGNOSIS — R011 Cardiac murmur, unspecified: Secondary | ICD-10-CM

## 2021-08-07 LAB — ECHOCARDIOGRAM COMPLETE
AR max vel: 1.44 cm2
AV Area VTI: 1.4 cm2
AV Area mean vel: 1.37 cm2
AV Mean grad: 12.5 mmHg
AV Peak grad: 22.4 mmHg
Ao pk vel: 2.37 m/s
Area-P 1/2: 4.19 cm2
P 1/2 time: 515 msec
S' Lateral: 2.5 cm

## 2021-08-08 ENCOUNTER — Ambulatory Visit (HOSPITAL_COMMUNITY): Payer: Medicare HMO | Attending: Internal Medicine

## 2021-08-08 DIAGNOSIS — R072 Precordial pain: Secondary | ICD-10-CM | POA: Diagnosis not present

## 2021-08-08 LAB — MYOCARDIAL PERFUSION IMAGING
Angina Index: 2
LV dias vol: 50 mL (ref 46–106)
LV sys vol: 16 mL
Nuc Stress EF: 69 %
Peak HR: 151 {beats}/min
Rest HR: 83 {beats}/min
Rest Nuclear Isotope Dose: 11 mCi
SDS: 0
SRS: 2
SSS: 2
ST Depression (mm): 0 mm
Stress Nuclear Isotope Dose: 30.9 mCi
TID: 1.2

## 2021-08-08 MED ORDER — TECHNETIUM TC 99M TETROFOSMIN IV KIT
30.9000 | PACK | Freq: Once | INTRAVENOUS | Status: AC | PRN
  Administered 2021-08-08: 30.9 via INTRAVENOUS
  Filled 2021-08-08: qty 31

## 2021-08-08 MED ORDER — TECHNETIUM TC 99M TETROFOSMIN IV KIT
11.0000 | PACK | Freq: Once | INTRAVENOUS | Status: AC | PRN
  Administered 2021-08-08: 11 via INTRAVENOUS
  Filled 2021-08-08: qty 11

## 2021-09-17 ENCOUNTER — Other Ambulatory Visit: Payer: Self-pay

## 2021-09-17 ENCOUNTER — Ambulatory Visit (INDEPENDENT_AMBULATORY_CARE_PROVIDER_SITE_OTHER): Payer: Medicare HMO | Admitting: Ophthalmology

## 2021-09-17 ENCOUNTER — Encounter (INDEPENDENT_AMBULATORY_CARE_PROVIDER_SITE_OTHER): Payer: Self-pay | Admitting: Ophthalmology

## 2021-09-17 DIAGNOSIS — E1169 Type 2 diabetes mellitus with other specified complication: Secondary | ICD-10-CM | POA: Diagnosis not present

## 2021-09-17 DIAGNOSIS — E119 Type 2 diabetes mellitus without complications: Secondary | ICD-10-CM

## 2021-09-17 DIAGNOSIS — H35073 Retinal telangiectasis, bilateral: Secondary | ICD-10-CM

## 2021-09-17 DIAGNOSIS — H43391 Other vitreous opacities, right eye: Secondary | ICD-10-CM

## 2021-09-17 NOTE — Assessment & Plan Note (Signed)
Suspicion in the past with periodic CME but this condition is now resolved since 2013

## 2021-09-17 NOTE — Assessment & Plan Note (Signed)
No detectable diabetic retinopathy stable

## 2021-09-17 NOTE — Progress Notes (Signed)
09/17/2021     CHIEF COMPLAINT Patient presents for  Chief Complaint  Patient presents with   Retina Follow Up      HISTORY OF PRESENT ILLNESS: Alexis Molina is a 70 y.o. female who presents to the clinic today for:   HPI     Retina Follow Up   Patient presents with  Other.  In both eyes.  This started 1 year ago.  Severity is mild.  Duration of 1 year.  Since onset it is stable.        Comments   1 year fu ou and oct Pt states, "I have been having waviness in both of my eyes since September. It looks like I am under water and I cannot focus. It happens about twice a week. I used to have things like this when I was about to have a migraine but I have not gotten a migraine." Pt denies any FOL but states that she has stable old floaters       Last edited by Kendra Opitz, COA on 09/17/2021  3:12 PM.      Referring physician: Rutherford Guys, MD [place],  [place] [id number]  HISTORICAL INFORMATION:   Selected notes from the [facility]: No current outpatient medications on file. (Ophthalmic Drugs)   No current facility-administered medications for this visit. (Ophthalmic Drugs)   Current Outpatient Medications (Other)  Medication Sig   albuterol (VENTOLIN HFA) 108 (90 Base) MCG/ACT inhaler Inhale 2 puffs into the lungs every 4 (four) hours as needed for wheezing or shortness of breath.   Calcium Polycarbophil (FIBER) 625 MG TABS Take 625 mg by mouth as needed. (Patient not taking: Reported on 07/23/2021)   diphenhydrAMINE (BENADRYL) 25 MG tablet Take 25 mg by mouth every 6 (six) hours as needed for itching. Itching   diphenhydrAMINE (BENADRYL) 25 MG tablet Take 1 tablet by mouth at bedtime. As needed (Patient not taking: Reported on 07/23/2021)   famotidine (PEPCID) 20 MG tablet Take 1 tablet by mouth daily.   fexofenadine (ALLEGRA) 180 MG tablet Take 1 tablet by mouth daily. (Patient not taking: Reported on  07/23/2021)   fluticasone (FLONASE) 50 MCG/ACT nasal spray Place 2 sprays into both nostrils at bedtime.   levothyroxine (SYNTHROID) 50 MCG tablet Take 50 mcg by mouth daily. (Patient not taking: Reported on 07/23/2021)   levothyroxine (SYNTHROID) 75 MCG tablet Take 1 tablet by mouth daily.   lisinopril (PRINIVIL,ZESTRIL) 20 MG tablet Take 20 mg by mouth daily.   Multiple Vitamin (MULTIVITAMIN WITH MINERALS) TABS tablet Take 1 tablet by mouth every other day. Gummy bears/ Unknown strength   nitroGLYCERIN (NITROSTAT) 0.4 MG SL tablet Place 1 tablet (0.4 mg total) under the tongue every 5 (five) minutes as needed.   pantoprazole (PROTONIX) 40 MG tablet Take 1 tablet by mouth daily.   rosuvastatin (CRESTOR) 20 MG tablet Take 20 mg by mouth daily.   triamterene-hydrochlorothiazide (MAXZIDE) 75-50 MG tablet Take 1 tablet by mouth daily.   No current facility-administered medications for this visit. (Other)      REVIEW OF SYSTEMS:    ALLERGIES Allergies  Allergen Reactions   Darvocet [Propoxyphene N-Acetaminophen] Other (See Comments)    Asthma attack   Orange Fruit [Citrus] Itching and Swelling   Vicodin [Hydrocodone-Acetaminophen] Hives and Itching   Dust Mite Extract Itching   Betamethasone Other (See Comments)   Celecoxib Other (See Comments)    abd  pain    Metformin Other (See Comments)   Morphine Itching   Oxycodone-Acetaminophen Other (See Comments)   Glimepiride Itching   Penicillins Hives   Sulfa Antibiotics Hives    PAST MEDICAL HISTORY Past Medical History:  Diagnosis Date   Allergic rhinitis    Anxiety    Body mass index 30.0-30.9, adult    Cataract    Cataracts, bilateral    Chest pain    Depression    Diabetes mellitus without complication ([place])    DM (diabetes mellitus) ([place])    Fatigue    Fatty liver    FH: colonic polyps    Hemangioma of intra-abdominal structures    Hiatal hernia    History of colonic polyps    Hyperlipidemia    Hypertension     Hypothyroidism    IBS (irritable bowel syndrome)    Obesity, unspecified    Osteopenia    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[place])    Peripheral edema    Peripheral edema    after knee surgery   Raynaud's syndrome without gangrene    Snake bite    SOB (shortness of breath)    Thyroid disease    Tubular adenoma of colon    Vitamin D deficiency    Vitreomacular adhesion of right eye 09/11/2020   Vitreous floaters of right eye    Past Surgical History:  Procedure Laterality Date   ABDOMINAL HYSTERECTOMY     BIOPSY  10/02/2020   Procedure: BIOPSY;  Surgeon: Wilford Corner, MD;  Location: [facility];  Service: Endoscopy;;   CARDIAC CATHETERIZATION N/A 07/30/2015   Procedure: Left Heart Cath and Coronary Angiography;  Surgeon: Jettie Booze, MD;  Location: [facility];  Service: Cardiovascular;  Laterality: N/A;   CARPAL TUNNEL RELEASE     CHOLECYSTECTOMY     ESOPHAGOGASTRODUODENOSCOPY (EGD) WITH PROPOFOL N/A 10/02/2020   Procedure: ESOPHAGOGASTRODUODENOSCOPY (EGD) WITH PROPOFOL;  Surgeon: Wilford Corner, MD;  Location: [facility];  Service: Endoscopy;  Laterality: N/A;   KNEE SURGERY     ROTATOR CUFF REPAIR Left    TUBAL LIGATION      FAMILY HISTORY Family History  Problem Relation Age of Onset   Alzheimer's disease Mother    Diabetes Mother    Hypertension Mother    Ulcers Father    Hypertension Father    Arthritis Father    Heart failure Father    Lung cancer Brother     SOCIAL HISTORY Social History   Tobacco Use   Smoking status: Former    Types: Cigarettes    Quit date: 12/09/1987    Years since quitting: 33.7   Smokeless tobacco: Never  Vaping Use   Vaping Use: Never used  Substance Use Topics   Alcohol use: No   Drug use: No         OPHTHALMIC EXAM:  Base Eye Exam     Visual Acuity (ETDRS)       Right Left   Dist [place] 20/30 -1 20/25   Dist ph [place] 20/25 +2          Tonometry (Tonopen, 3:16 PM)       Right Left    Pressure 18 25         Tonometry #2 (Tonopen, 3:16 PM)       Right Left   Pressure  26         Pupils       Pupils Dark Light Shape React APD   Right  PERRL 4 3 Round Brisk None   Left PERRL 4 3 Round Brisk None         Visual Fields (Counting fingers)       Left Right    Full Full         Extraocular Movement       Right Left    Full Full         Neuro/Psych     Oriented x3: Yes   Mood/Affect: Normal         Dilation     Both eyes: 1.0% Mydriacyl, 2.5% Phenylephrine @ 3:16 PM           Slit Lamp and Fundus Exam     External Exam       Right Left   External Normal Normal         Slit Lamp Exam       Right Left   Lids/Lashes Normal Normal   Conjunctiva/Sclera White and quiet White and quiet   Cornea Clear Clear   Anterior Chamber Deep and quiet Deep and quiet   Iris Round and reactive Round and reactive   Lens Centered posterior chamber intraocular lens, multifocal Centered posterior chamber intraocular lens, multifocal   Anterior Vitreous Normal Normal         Fundus Exam       Right Left   Posterior Vitreous Normal Posterior vitreous detachment   Disc Normal Normal   C/D Ratio 0.35 0.4   Macula Normal, no hemorrhage minor atrophy centrally   Vessels no DR no DR   Periphery Normal Normal            IMAGING AND PROCEDURES  Imaging and Procedures for 09/17/21  OCT, Retina - OU - Both Eyes       Right Eye Quality was good. Scan locations included subfoveal. Central Foveal Thickness: 288. Progression has been stable. Findings include normal foveal contour.   Left Eye Scan locations included subfoveal. Central Foveal Thickness: 288. Progression has been stable. Findings include normal foveal contour.   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 Retinal telangiectasia of both eyes  H35.073     2. Other vitreous opacities, right eye  H43.391 OCT, Retina - OU - Both Eyes    3. Type 2 diabetes mellitus without complication, without long-term current use of insulin (HCC)  E11.9 OCT, Retina - OU - Both Eyes    4. Type 2 diabetes mellitus with other specified complication, without long-term current use of insulin (HCC)  E11.69       1.  No detectable diabetic retinopathy in either eye.  Minor vitreous opacities continue to observe  2.  3.  Ophthalmic Meds Ordered this visit:  No orders of the defined types were placed in this encounter.      Return in about 1 year (around 09/17/2022) for DILATE OU, OCT, COLOR FP.  There are no Patient Instructions on file for this visit.   Explained the diagnoses, plan, and follow up with the patient and they expressed understanding.  Patient expressed understanding of the importance of  proper follow up care.   Clent Demark Zariel Capano M.D. Diseases & Surgery of the Retina and Vitreous Retina & Diabetic [place] 09/17/21     Abbreviations: M myopia (nearsighted); A astigmatism; H hyperopia (farsighted); P presbyopia; Mrx spectacle prescription;  CTL contact lenses; OD right eye; OS left eye; OU both eyes  XT exotropia; ET esotropia; PEK punctate epithelial keratitis; PEE punctate epithelial erosions; DES dry eye syndrome; MGD meibomian gland dysfunction; ATs artificial tears; PFAT's preservative free artificial tears; [place] nuclear sclerotic cataract; PSC posterior subcapsular cataract; ERM epi-retinal membrane; PVD posterior vitreous detachment; RD retinal detachment; DM diabetes mellitus; DR diabetic retinopathy; NPDR non-proliferative diabetic retinopathy; PDR proliferative diabetic  retinopathy; CSME clinically significant macular edema; DME diabetic macular edema; dbh dot blot hemorrhages; CWS cotton wool spot; POAG primary open angle glaucoma; C/D cup-to-disc ratio; HVF humphrey visual field; GVF goldmann visual field; OCT optical coherence tomography; IOP intraocular pressure; BRVO Branch retinal vein occlusion; CRVO central retinal vein occlusion; CRAO central retinal artery occlusion; BRAO branch retinal artery occlusion; RT retinal tear; SB scleral buckle; PPV pars plana vitrectomy; VH Vitreous hemorrhage; PRP panretinal laser photocoagulation; IVK intravitreal kenalog; VMT vitreomacular traction; MH Macular hole;  NVD neovascularization of the disc; NVE neovascularization elsewhere; AREDS age related eye disease study; ARMD age related macular degeneration; POAG primary open angle glaucoma; EBMD epithelial/anterior basement membrane dystrophy; ACIOL anterior chamber intraocular lens; IOL intraocular lens; PCIOL posterior chamber intraocular lens; Phaco/IOL phacoemulsification with intraocular lens placement; [place] photorefractive keratectomy; LASIK laser assisted in situ keratomileusis; HTN hypertension; DM diabetes mellitus; COPD chronic obstructive pulmonary disease

## 2021-09-17 NOTE — Assessment & Plan Note (Signed)
Minor from PVD

## 2021-09-18 DIAGNOSIS — R152 Fecal urgency: Secondary | ICD-10-CM | POA: Diagnosis not present

## 2021-09-18 DIAGNOSIS — N3941 Urge incontinence: Secondary | ICD-10-CM | POA: Diagnosis not present

## 2021-09-18 DIAGNOSIS — R3915 Urgency of urination: Secondary | ICD-10-CM

## 2021-09-18 DIAGNOSIS — R159 Full incontinence of feces: Secondary | ICD-10-CM | POA: Diagnosis not present

## 2021-09-18 HISTORY — DX: Urgency of urination: R39.15

## 2021-11-14 DIAGNOSIS — Z1231 Encounter for screening mammogram for malignant neoplasm of breast: Secondary | ICD-10-CM | POA: Diagnosis not present

## 2021-11-22 DIAGNOSIS — Z0000 Encounter for general adult medical examination without abnormal findings: Secondary | ICD-10-CM | POA: Diagnosis not present

## 2021-11-22 DIAGNOSIS — Z1389 Encounter for screening for other disorder: Secondary | ICD-10-CM | POA: Diagnosis not present

## 2021-11-26 DIAGNOSIS — M8588 Other specified disorders of bone density and structure, other site: Secondary | ICD-10-CM | POA: Diagnosis not present

## 2021-11-26 DIAGNOSIS — E1169 Type 2 diabetes mellitus with other specified complication: Secondary | ICD-10-CM | POA: Diagnosis not present

## 2021-11-26 DIAGNOSIS — E559 Vitamin D deficiency, unspecified: Secondary | ICD-10-CM | POA: Diagnosis not present

## 2021-11-26 DIAGNOSIS — E785 Hyperlipidemia, unspecified: Secondary | ICD-10-CM | POA: Diagnosis not present

## 2021-11-26 DIAGNOSIS — N1832 Chronic kidney disease, stage 3b: Secondary | ICD-10-CM | POA: Diagnosis not present

## 2021-11-26 DIAGNOSIS — F338 Other recurrent depressive disorders: Secondary | ICD-10-CM | POA: Diagnosis not present

## 2021-11-26 DIAGNOSIS — I10 Essential (primary) hypertension: Secondary | ICD-10-CM | POA: Diagnosis not present

## 2021-11-26 DIAGNOSIS — E1122 Type 2 diabetes mellitus with diabetic chronic kidney disease: Secondary | ICD-10-CM | POA: Diagnosis not present

## 2021-11-26 DIAGNOSIS — E039 Hypothyroidism, unspecified: Secondary | ICD-10-CM | POA: Diagnosis not present

## 2021-11-28 DIAGNOSIS — E1122 Type 2 diabetes mellitus with diabetic chronic kidney disease: Secondary | ICD-10-CM | POA: Diagnosis not present

## 2021-11-28 DIAGNOSIS — E1169 Type 2 diabetes mellitus with other specified complication: Secondary | ICD-10-CM | POA: Diagnosis not present

## 2021-11-28 DIAGNOSIS — E039 Hypothyroidism, unspecified: Secondary | ICD-10-CM | POA: Diagnosis not present

## 2021-11-28 DIAGNOSIS — E1165 Type 2 diabetes mellitus with hyperglycemia: Secondary | ICD-10-CM | POA: Diagnosis not present

## 2021-11-28 DIAGNOSIS — E782 Mixed hyperlipidemia: Secondary | ICD-10-CM | POA: Diagnosis not present

## 2021-11-28 DIAGNOSIS — I10 Essential (primary) hypertension: Secondary | ICD-10-CM | POA: Diagnosis not present

## 2021-11-28 DIAGNOSIS — K219 Gastro-esophageal reflux disease without esophagitis: Secondary | ICD-10-CM | POA: Diagnosis not present

## 2021-12-11 DIAGNOSIS — H40012 Open angle with borderline findings, low risk, left eye: Secondary | ICD-10-CM | POA: Diagnosis not present

## 2021-12-26 DIAGNOSIS — E1122 Type 2 diabetes mellitus with diabetic chronic kidney disease: Secondary | ICD-10-CM | POA: Diagnosis not present

## 2021-12-26 DIAGNOSIS — D500 Iron deficiency anemia secondary to blood loss (chronic): Secondary | ICD-10-CM | POA: Diagnosis not present

## 2021-12-26 DIAGNOSIS — E1165 Type 2 diabetes mellitus with hyperglycemia: Secondary | ICD-10-CM | POA: Diagnosis not present

## 2021-12-26 DIAGNOSIS — E039 Hypothyroidism, unspecified: Secondary | ICD-10-CM | POA: Diagnosis not present

## 2021-12-26 DIAGNOSIS — E1169 Type 2 diabetes mellitus with other specified complication: Secondary | ICD-10-CM | POA: Diagnosis not present

## 2021-12-26 DIAGNOSIS — K219 Gastro-esophageal reflux disease without esophagitis: Secondary | ICD-10-CM | POA: Diagnosis not present

## 2021-12-26 DIAGNOSIS — E785 Hyperlipidemia, unspecified: Secondary | ICD-10-CM | POA: Diagnosis not present

## 2021-12-26 DIAGNOSIS — I10 Essential (primary) hypertension: Secondary | ICD-10-CM | POA: Diagnosis not present

## 2021-12-26 DIAGNOSIS — N1832 Chronic kidney disease, stage 3b: Secondary | ICD-10-CM | POA: Diagnosis not present

## 2021-12-31 DIAGNOSIS — E039 Hypothyroidism, unspecified: Secondary | ICD-10-CM | POA: Diagnosis not present

## 2021-12-31 DIAGNOSIS — R6889 Other general symptoms and signs: Secondary | ICD-10-CM | POA: Diagnosis not present

## 2022-01-06 IMAGING — CT CT HEART MORP W/ CTA COR W/ SCORE W/ CA W/CM &/OR W/O CM
4 of 7 series · 8 of 20 positions shown, 9 images · IV contrast (APPLIED)
Comparison: 01/22/2020 chest radiograph. 04/26/2005 CTA chest
report only. Abdominal MRI report of 11/20/2018.
COMPARISON: 01/22/2020 chest radiograph. 04/26/2005 CTA chest
report only. Abdominal MRI report of 11/20/2018.

Addendum:
EXAM:
OVER-READ INTERPRETATION  CT CHEST

The following report is an over-read performed by radiologist Dr.
Hong Yan Mizoguchi [[email or web address]] on 02/01/2020. This over-read
does not include interpretation of cardiac or coronary anatomy or
pathology. The coronary CTA interpretation by the cardiologist is
attached.
CLINICAL DATA: 68F with family history of CAD, DM, hypertension
palpitations, and chest pain.
Cardiac/Coronary  CT
TECHNIQUE: The patient was scanned on a Phillips Force scanner.

[Series 6: best diast 69 % · axial · 0.39mm/px · z∈[-211,-175]mm · 2 of 271 slices shown, 3 images]
[im 91/271  vessel]
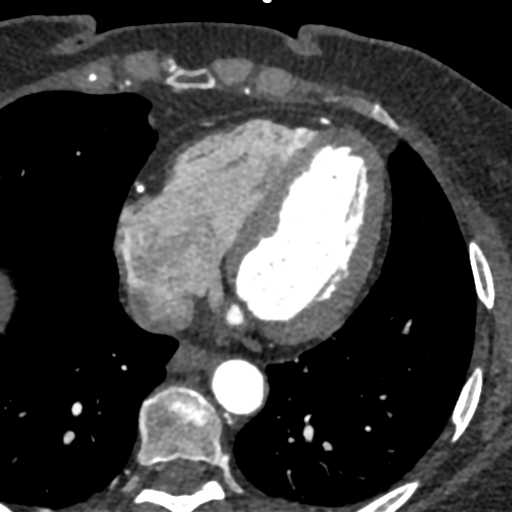
[im 91/271  lung]
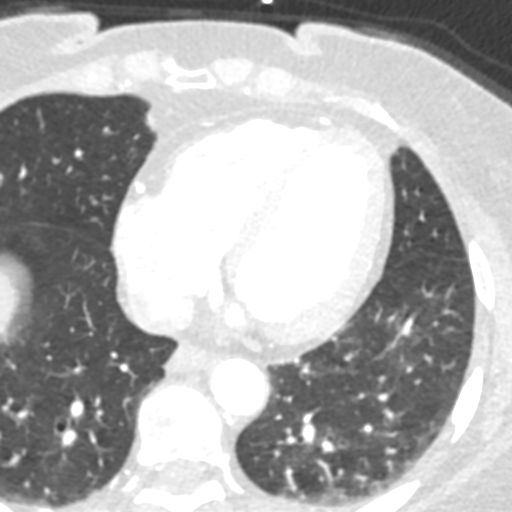
[im 181/271  vessel]
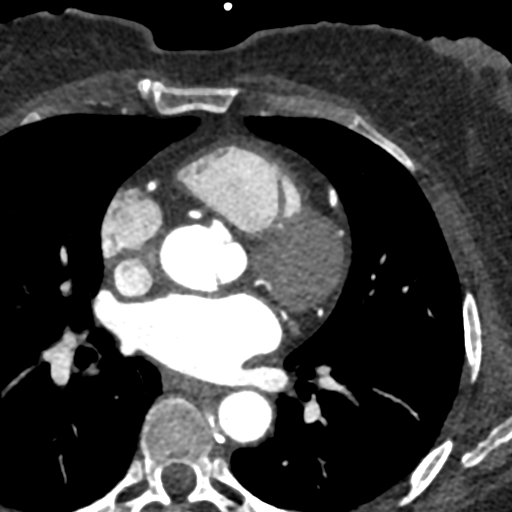

[Series 8: best syst 31 % · axial · 0.39mm/px · z∈[-211,-175]mm · 2 of 271 slices shown]
[im 91/271  vessel]
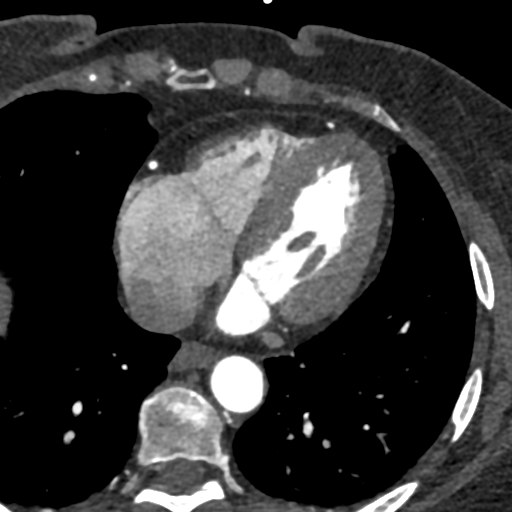
[im 181/271  vessel]
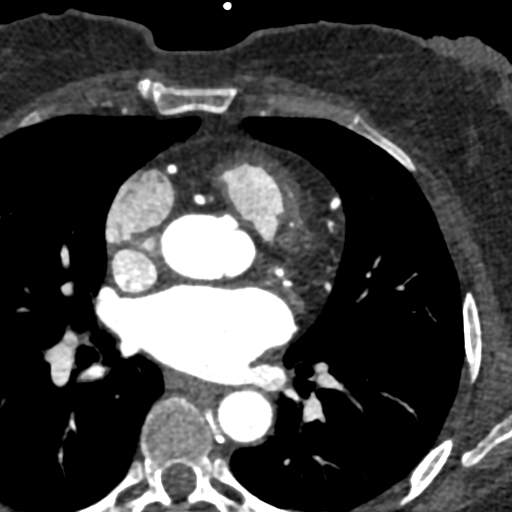

[Series 9: ts diast sharp 31 % · axial · 0.39mm/px · z∈[-211,-175]mm · 2 of 271 slices shown]
[im 91/271  lung]
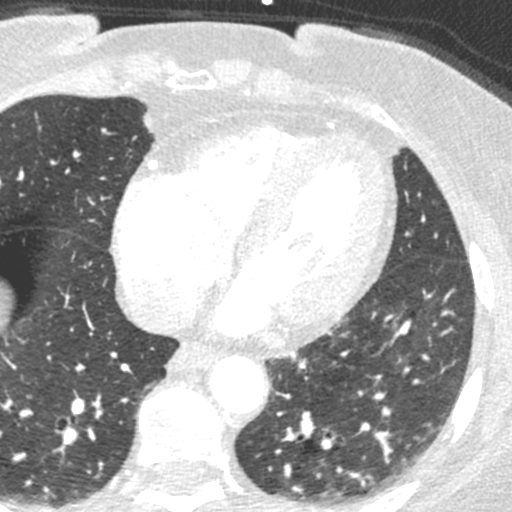
[im 181/271  lung]
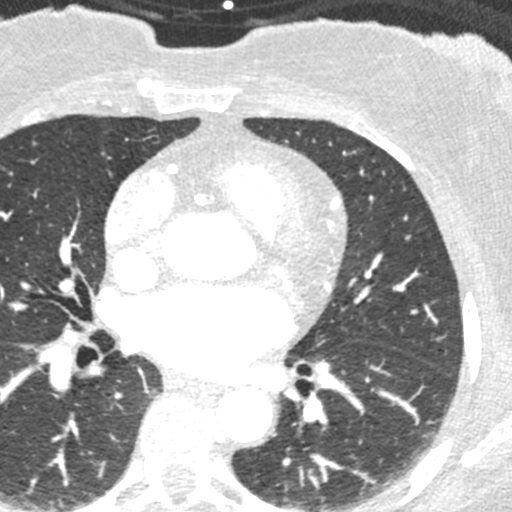

[Series 10: ts syst sharp 31 % · axial · 0.39mm/px · z∈[-211,-175]mm · 2 of 271 slices shown]
[im 91/271  lung]
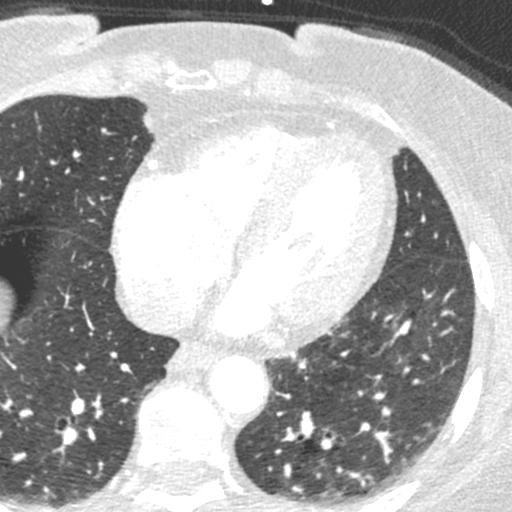
[im 181/271  lung]
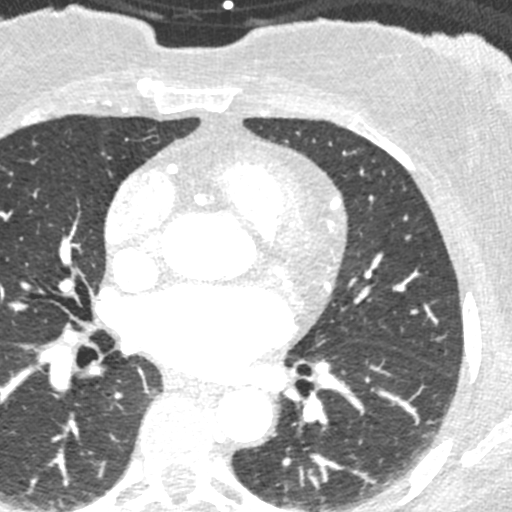

[8 of 20 positions shown; findings below may reference images not displayed]

FINDINGS: Vascular: Aortic atherosclerosis. No dissection. No central
pulmonary embolism, on this non-dedicated study.

Mediastinum/Nodes: No imaged thoracic adenopathy.

Lungs/Pleura: No pleural fluid.  Clear imaged lungs.

Upper Abdomen: High right hepatic lobe hypoattenuating lesion was
characterized as a cyst on MRI of 11/20/2018. Normal imaged portions
of the stomach, spleen.

Musculoskeletal: No acute osseous abnormality.
IMPRESSION: No acute findings in the imaged extracardiac chest. Aortic
Atherosclerosis (PG5RR-4F6.6).
FINDINGS: A 120 kV prospective scan was triggered in the descending thoracic
aorta at 111 HU's. Axial non-contrast 3 mm slices were carried out
through the heart. The data set was analyzed on a dedicated work
station and scored using the Agatson method. Gantry rotation speed
was 250 msecs and collimation was .6 mm. No beta blockade and 0.8 mg
of sl NTG was given. The 3D data set was reconstructed in 5%
intervals of the 67-82 % of the R-R cycle. Diastolic phases were
analyzed on a dedicated work station using MPR, MIP and VRT modes.
The patient received 80 cc of contrast.

Aorta: Normal size. Mild calcification of the aortic root. No
dissection.

Aortic Valve:  Trileaflet.  Mild calcification.

Coronary Arteries:  Normal coronary origin.  Right dominance.

RCA is a large dominant artery that gives rise to PDA and 3 ODVENS
branches. There is no plaque.

Left main is a large artery that gives rise to LAD and LCX arteries.

LAD is a large vessel that has no plaque.

LCX is a non-dominant artery that gives rise to one large OM1
branch. LCX is small distal to OM1. There is a small OM2. There is
no plaque.

Other findings:

Normal pulmonary vein drainage into the left atrium.

Normal let atrial appendage without a thrombus.

Normal size of the pulmonary artery.
IMPRESSION: 1. Coronary calcium score of 0. This was 0 percentile for age and
sex matched control.

2. Normal coronary origin with right dominance.

3. No evidence of CAD.

4.  Mild calcification of the aortic root and the aortic valve.

*** End of Addendum ***
EXAM:
OVER-READ INTERPRETATION  CT CHEST

The following report is an over-read performed by radiologist Dr.
Hong Yan Mizoguchi [[email or web address]] on 02/01/2020. This over-read
does not include interpretation of cardiac or coronary anatomy or
pathology. The coronary CTA interpretation by the cardiologist is
attached.
FINDINGS: Vascular: Aortic atherosclerosis. No dissection. No central
pulmonary embolism, on this non-dedicated study.

Mediastinum/Nodes: No imaged thoracic adenopathy.

Lungs/Pleura: No pleural fluid.  Clear imaged lungs.

Upper Abdomen: High right hepatic lobe hypoattenuating lesion was
characterized as a cyst on MRI of 11/20/2018. Normal imaged portions
of the stomach, spleen.

Musculoskeletal: No acute osseous abnormality.
IMPRESSION: No acute findings in the imaged extracardiac chest. Aortic
Atherosclerosis (PG5RR-4F6.6).

## 2022-01-13 DIAGNOSIS — H524 Presbyopia: Secondary | ICD-10-CM | POA: Diagnosis not present

## 2022-01-13 DIAGNOSIS — H401131 Primary open-angle glaucoma, bilateral, mild stage: Secondary | ICD-10-CM | POA: Diagnosis not present

## 2022-01-13 DIAGNOSIS — Z961 Presence of intraocular lens: Secondary | ICD-10-CM | POA: Diagnosis not present

## 2022-01-13 DIAGNOSIS — E119 Type 2 diabetes mellitus without complications: Secondary | ICD-10-CM | POA: Diagnosis not present

## 2022-01-13 DIAGNOSIS — R6889 Other general symptoms and signs: Secondary | ICD-10-CM | POA: Diagnosis not present

## 2022-01-15 DIAGNOSIS — N1831 Chronic kidney disease, stage 3a: Secondary | ICD-10-CM | POA: Diagnosis not present

## 2022-01-23 DIAGNOSIS — I129 Hypertensive chronic kidney disease with stage 1 through stage 4 chronic kidney disease, or unspecified chronic kidney disease: Secondary | ICD-10-CM | POA: Diagnosis not present

## 2022-01-23 DIAGNOSIS — N1831 Chronic kidney disease, stage 3a: Secondary | ICD-10-CM | POA: Diagnosis not present

## 2022-01-23 DIAGNOSIS — E1122 Type 2 diabetes mellitus with diabetic chronic kidney disease: Secondary | ICD-10-CM | POA: Diagnosis not present

## 2022-01-23 DIAGNOSIS — E785 Hyperlipidemia, unspecified: Secondary | ICD-10-CM | POA: Diagnosis not present

## 2022-01-23 DIAGNOSIS — E039 Hypothyroidism, unspecified: Secondary | ICD-10-CM | POA: Diagnosis not present

## 2022-01-23 DIAGNOSIS — R6889 Other general symptoms and signs: Secondary | ICD-10-CM | POA: Diagnosis not present

## 2022-01-24 ENCOUNTER — Other Ambulatory Visit: Payer: Self-pay

## 2022-01-24 DIAGNOSIS — H401131 Primary open-angle glaucoma, bilateral, mild stage: Secondary | ICD-10-CM

## 2022-01-24 DIAGNOSIS — E119 Type 2 diabetes mellitus without complications: Secondary | ICD-10-CM | POA: Insufficient documentation

## 2022-01-24 DIAGNOSIS — E1121 Type 2 diabetes mellitus with diabetic nephropathy: Secondary | ICD-10-CM

## 2022-01-24 DIAGNOSIS — I10 Essential (primary) hypertension: Secondary | ICD-10-CM | POA: Insufficient documentation

## 2022-01-24 DIAGNOSIS — N1832 Chronic kidney disease, stage 3b: Secondary | ICD-10-CM

## 2022-01-24 DIAGNOSIS — Z79899 Other long term (current) drug therapy: Secondary | ICD-10-CM

## 2022-01-24 DIAGNOSIS — Z86010 Personal history of colonic polyps: Secondary | ICD-10-CM | POA: Insufficient documentation

## 2022-01-24 DIAGNOSIS — I7300 Raynaud's syndrome without gangrene: Secondary | ICD-10-CM | POA: Insufficient documentation

## 2022-01-24 HISTORY — DX: Other long term (current) drug therapy: Z79.899

## 2022-01-24 HISTORY — DX: Chronic kidney disease, stage 3b: N18.32

## 2022-01-24 HISTORY — DX: Type 2 diabetes mellitus with diabetic nephropathy: E11.21

## 2022-01-24 HISTORY — DX: Primary open-angle glaucoma, bilateral, mild stage: H40.1131

## 2022-01-28 ENCOUNTER — Other Ambulatory Visit: Payer: Self-pay

## 2022-01-28 ENCOUNTER — Ambulatory Visit: Payer: Medicare HMO | Admitting: Cardiology

## 2022-01-28 VITALS — BP 108/52 | HR 62 | Ht 62.0 in | Wt 144.8 lb

## 2022-01-28 DIAGNOSIS — E782 Mixed hyperlipidemia: Secondary | ICD-10-CM | POA: Diagnosis not present

## 2022-01-28 DIAGNOSIS — I10 Essential (primary) hypertension: Secondary | ICD-10-CM

## 2022-01-28 DIAGNOSIS — R6889 Other general symptoms and signs: Secondary | ICD-10-CM | POA: Diagnosis not present

## 2022-01-28 NOTE — Progress Notes (Signed)
Cardiology Office Note:    Date:  01/28/2022   ID:  Alexis Molina, Alexis Molina 1951/05/25, MRN [id number]  PCP:  Kristen Loader, FNP  Cardiologist:  Jenean Lindau, MD   Referring MD: Kristen Loader, FNP    ASSESSMENT:    1. Essential hypertension   2. Mixed hyperlipidemia    PLAN:    In order of problems listed above:  Primary prevention stressed with the patient.  Importance of compliance with diet medication stressed and she vocalized understanding.  She was advised to walk at least half an hour a day 5 days a week and she promises to do so. Essential hypertension: Lifestyle modification urged.  Her blood pressure stable.  She is on ACE inhibitor's because of diet-controlled diabetes she tells me. Mixed dyslipidemia: On statin therapy.  Lipids reviewed.  This is followed by primary care. She will be seen in follow-up appointment on a as needed basis only.  She had multiple questions which were answered to her satisfaction.   Medication Adjustments/Labs and Tests Ordered: Current medicines are reviewed at length with the patient today.  Concerns regarding medicines are outlined above.  No orders of the defined types were placed in this encounter.  No orders of the defined types were placed in this encounter.    Chief Complaint  Patient presents with   Follow-up          History of Present Illness:    Alexis Molina is a 71 y.o. female.  Patient has past medical history of essential hypertension and mixed dyslipidemia.  She denies any problems at this time and takes care of activities of daily living.  She exercises on elliptical on a daily spaces without any problems.  No chest pain orthopnea or PND.  At the time of my evaluation, the patient is alert awake oriented and in no distress.  Past Medical History:  Diagnosis Date   Abdominal pain 09/29/2020   Acute gastritis 07/22/2021   Allergic rhinitis    Anemia due to chronic blood loss 07/22/2021   Angina pectoris  ([place]) 01/28/2017   Anosmia 12/18/2020   Anxiety    Body mass index (BMI) 30.0-30.9, adult 07/22/2021   Carpal tunnel syndrome of left wrist 06/03/2016   Cataracts, bilateral    Chest pain of uncertain etiology 03/30/5360   Chronic kidney disease, stage 3b ([place]) 01/24/2022   Chronic left shoulder pain 09/18/2016   Diabetes mellitus without complication ([place])    Diabetic renal disease ([place]) 01/24/2022   Dysfunctional voiding of urine 02/24/2019   Dysgeusia 12/18/2020   Enteritis 09/28/2020   Essential hypertension 07/23/2021   Family history of colonic polyps 07/22/2021   Family history of premature CAD 05/11/2015   Fatigue    Fatty liver    Gastroesophageal reflux disease 07/22/2021   Generalized abdominal pain 09/29/2020   Hemangioma of intra-abdominal structure 07/22/2021   Hiatal hernia    History of colonic polyps    History of gastritis 07/22/2021   Hyperglycemia due to type 2 diabetes mellitus ([place]) 07/22/2021   Hyperlipidemia    Hypertension    Hypertensive disorder 12/08/1988   Hypokalemia    Hypomagnesemia    Hypoproteinemia ([place]) 07/22/2021   Hypothyroidism    Incontinence of feces with fecal urgency 02/24/2019   Irritable bowel syndrome with diarrhea 07/22/2021   Medication management 01/24/2022   Mixed dyslipidemia 07/23/2021   Neck pain 12/15/2016   Obesity, unspecified    Osteopenia of lumbar spine 07/22/2021   Other  allergy status, other than to drugs and biological substances 07/22/2021   Other vitreous opacities, right eye 09/11/2020   Peripheral edema    Peripheral edema    after knee surgery   Posterior vitreous detachment of both eyes 09/11/2020   Prediabetes 07/22/2021   Primary open-angle glaucoma, bilateral, mild stage 01/24/2022   Problematic vaginal discharge 07/22/2021   Raynaud's disease 07/22/2021   Raynaud's syndrome without gangrene    Recurrent depression ([place]) 07/22/2021   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   S/P arthroscopy of left shoulder 04/07/2017    Shortness of breath 12/12/2019   Snake bite    Tear of left rotator cuff 12/15/2016   Trigger middle finger of left hand 01/24/2016   Tubular adenoma of colon    Type 2 diabetes mellitus with other specified complication ([place]) 0/0/8676   Urge urinary incontinence 02/24/2019   Urinary urgency 09/18/2021   Vaginal lesion 07/22/2021   Vitamin D deficiency    Vitreous floaters of right eye    Vulvitis 07/22/2021    Past Surgical History:  Procedure Laterality Date   ABDOMINAL HYSTERECTOMY     BIOPSY  10/02/2020   Procedure: BIOPSY;  Surgeon: Wilford Corner, MD;  Location: [facility];  Service: Endoscopy;;   CARDIAC CATHETERIZATION N/A 07/30/2015   Procedure: Left Heart Cath and Coronary Angiography;  Surgeon: Jettie Booze, MD;  Location: [facility];  Service: Cardiovascular;  Laterality: N/A;   CARPAL TUNNEL RELEASE     CHOLECYSTECTOMY     ESOPHAGOGASTRODUODENOSCOPY (EGD) WITH PROPOFOL N/A 10/02/2020   Procedure: ESOPHAGOGASTRODUODENOSCOPY (EGD) WITH PROPOFOL;  Surgeon: Wilford Corner, MD;  Location: [facility];  Service: Endoscopy;  Laterality: N/A;   KNEE SURGERY     ROTATOR CUFF REPAIR Left    TUBAL LIGATION      Current Medications: Current Meds  Medication Sig   levothyroxine (SYNTHROID) 75 MCG tablet Take 50 mcg by mouth daily.   lisinopril (PRINIVIL,ZESTRIL) 20 MG tablet Take 10 mg by mouth daily.   Multiple Vitamin (MULTIVITAMIN WITH MINERALS) TABS tablet Take 1 tablet by mouth every other day. Gummy bears/ Unknown strength   nitroGLYCERIN (NITROSTAT) 0.4 MG SL tablet Place 0.4 mg under the tongue every 5 (five) minutes as needed for chest pain.   pantoprazole (PROTONIX) 40 MG tablet Take 1 tablet by mouth daily.   rosuvastatin (CRESTOR) 20 MG tablet Take 20 mg by mouth daily.   triamterene-hydrochlorothiazide (MAXZIDE) 75-50 MG tablet Take 1 tablet by mouth daily.     Allergies:   Darvocet [propoxyphene n-acetaminophen], Orange fruit [citrus], Vicodin  [hydrocodone-acetaminophen], Dust mite extract, Betamethasone, Celecoxib, Metformin, Morphine, Oxycodone-acetaminophen, Glimepiride, Penicillins, and Sulfa antibiotics   Social History   Socioeconomic History   Marital status: Widowed    Spouse name: Not on file   Number of children: Not on file   Years of education: Not on file   Highest education level: Not on file  Occupational History   Not on file  Tobacco Use   Smoking status: Former    Types: Cigarettes    Quit date: 12/09/1987    Years since quitting: 34.1   Smokeless tobacco: Never  Vaping Use   Vaping Use: Never used  Substance and Sexual Activity   Alcohol use: No   Drug use: No   Sexual activity: Not on file  Other Topics Concern   Not on file  Social History Narrative   Not on file   Social Determinants of Health   Financial Resource Strain:  Not on file  Food Insecurity: Not on file  Transportation Needs: Not on file  Physical Activity: Not on file  Stress: Not on file  Social Connections: Not on file     Family History: The patient's family history includes Alzheimer's disease in her mother; Arthritis in her father; Diabetes in her mother; Heart failure in her father; Hypertension in her father and mother; Lung cancer in her brother; Ulcers in her father.  ROS:   Please see the history of present illness.    All other systems reviewed and are negative.  EKGs/Labs/Other Studies Reviewed:    The following studies were reviewed today: IMPRESSION: 1. Coronary calcium score of 0. This was 0 percentile for age and sex matched control.   2. Normal coronary origin with right dominance.   3. No evidence of CAD.   4.  Mild calcification of the aortic root and the aortic valve.   Skeet Latch, MD     Electronically Signed   By: Skeet Latch   On: 02/01/2020 18:25     Recent Labs: No results found for requested labs within last 8760 hours.  Recent Lipid Panel No results found for: CHOL,  TRIG, HDL, CHOLHDL, VLDL, LDLCALC, LDLDIRECT  Physical Exam:    VS:  BP (!) 108/52 (BP Location: Right Arm, Patient Position: Sitting)    Pulse 62    Ht 5\' 2"  (1.575 m)    Wt 144 lb 12.8 oz (65.7 kg)    SpO2 99%    BMI 26.48 kg/m     Wt Readings from Last 3 Encounters:  01/28/22 144 lb 12.8 oz (65.7 kg)  08/08/21 144 lb (65.3 kg)  07/23/21 144 lb 3.2 oz (65.4 kg)     GEN: Patient is in no acute distress HEENT: Normal NECK: No JVD; No carotid bruits LYMPHATICS: No lymphadenopathy CARDIAC: Hear sounds regular, 2/6 systolic murmur at the apex. RESPIRATORY:  Clear to auscultation without rales, wheezing or rhonchi  ABDOMEN: Soft, non-tender, non-distended MUSCULOSKELETAL:  No edema; No deformity  SKIN: Warm and dry NEUROLOGIC:  Alert and oriented x 3 PSYCHIATRIC:  Normal affect   Signed, Jenean Lindau, MD  01/28/2022 1:29 PM    [facility]

## 2022-01-28 NOTE — Patient Instructions (Signed)
Medication Instructions:  Your physician recommends that you continue on your current medications as directed. Please refer to the Current Medication list given to you today.  *If you need a refill on your cardiac medications before your next appointment, please call your pharmacy*   Lab Work: NONE If you have labs (blood work) drawn today and your tests are completely normal, you will receive your results only by: [place] (if you have MyChart) OR A paper copy in the mail If you have any lab test that is abnormal or we need to change your treatment, we will call you to review the results.   Testing/Procedures: NONE   Follow-Up: At [facility], you and your health needs are our priority.  As part of our continuing mission to provide you with exceptional heart care, we have created designated Provider Care Teams.  These Care Teams include your primary Cardiologist (physician) and Advanced Practice Providers (APPs -  Physician Assistants and Nurse Practitioners) who all work together to provide you with the care you need, when you need it.  We recommend signing up for the patient portal called "MyChart".  Sign up information is provided on this After Visit Summary.  MyChart is used to connect with patients for Virtual Visits (Telemedicine).  Patients are able to view lab/test results, encounter notes, upcoming appointments, etc.  Non-urgent messages can be sent to your provider as well.   To learn more about what you can do with MyChart, go to [email or web address].    Your next appointment:  As Needed for all your cardiac needs   The format for your next appointment:     Provider:   Jyl Heinz, MD    Other Instructions

## 2022-02-11 DIAGNOSIS — H04123 Dry eye syndrome of bilateral lacrimal glands: Secondary | ICD-10-CM | POA: Diagnosis not present

## 2022-02-11 DIAGNOSIS — H401131 Primary open-angle glaucoma, bilateral, mild stage: Secondary | ICD-10-CM | POA: Diagnosis not present

## 2022-03-21 DIAGNOSIS — E1169 Type 2 diabetes mellitus with other specified complication: Secondary | ICD-10-CM | POA: Diagnosis not present

## 2022-03-21 DIAGNOSIS — E1122 Type 2 diabetes mellitus with diabetic chronic kidney disease: Secondary | ICD-10-CM | POA: Diagnosis not present

## 2022-03-21 DIAGNOSIS — E039 Hypothyroidism, unspecified: Secondary | ICD-10-CM | POA: Diagnosis not present

## 2022-03-21 DIAGNOSIS — E782 Mixed hyperlipidemia: Secondary | ICD-10-CM | POA: Diagnosis not present

## 2022-03-21 DIAGNOSIS — I10 Essential (primary) hypertension: Secondary | ICD-10-CM | POA: Diagnosis not present

## 2022-05-01 DIAGNOSIS — R6889 Other general symptoms and signs: Secondary | ICD-10-CM | POA: Diagnosis not present

## 2022-05-09 DIAGNOSIS — R6889 Other general symptoms and signs: Secondary | ICD-10-CM | POA: Diagnosis not present

## 2022-05-09 DIAGNOSIS — I10 Essential (primary) hypertension: Secondary | ICD-10-CM | POA: Diagnosis not present

## 2022-05-09 DIAGNOSIS — Z4542 Encounter for adjustment and management of neuropacemaker (brain) (peripheral nerve) (spinal cord): Secondary | ICD-10-CM | POA: Diagnosis not present

## 2022-05-09 DIAGNOSIS — E039 Hypothyroidism, unspecified: Secondary | ICD-10-CM | POA: Diagnosis not present

## 2022-05-09 DIAGNOSIS — E785 Hyperlipidemia, unspecified: Secondary | ICD-10-CM | POA: Diagnosis not present

## 2022-05-09 DIAGNOSIS — Z87891 Personal history of nicotine dependence: Secondary | ICD-10-CM | POA: Diagnosis not present

## 2022-05-09 DIAGNOSIS — K219 Gastro-esophageal reflux disease without esophagitis: Secondary | ICD-10-CM | POA: Diagnosis not present

## 2022-05-09 DIAGNOSIS — Z79899 Other long term (current) drug therapy: Secondary | ICD-10-CM | POA: Diagnosis not present

## 2022-05-09 DIAGNOSIS — N398 Other specified disorders of urinary system: Secondary | ICD-10-CM | POA: Diagnosis not present

## 2022-05-14 DIAGNOSIS — H401131 Primary open-angle glaucoma, bilateral, mild stage: Secondary | ICD-10-CM | POA: Diagnosis not present

## 2022-05-14 DIAGNOSIS — R6889 Other general symptoms and signs: Secondary | ICD-10-CM | POA: Diagnosis not present

## 2022-05-14 DIAGNOSIS — H3581 Retinal edema: Secondary | ICD-10-CM | POA: Diagnosis not present

## 2022-05-19 ENCOUNTER — Ambulatory Visit (INDEPENDENT_AMBULATORY_CARE_PROVIDER_SITE_OTHER): Payer: Medicare HMO | Admitting: Ophthalmology

## 2022-05-19 ENCOUNTER — Encounter (INDEPENDENT_AMBULATORY_CARE_PROVIDER_SITE_OTHER): Payer: Self-pay | Admitting: Ophthalmology

## 2022-05-19 DIAGNOSIS — H43811 Vitreous degeneration, right eye: Secondary | ICD-10-CM | POA: Diagnosis not present

## 2022-05-19 DIAGNOSIS — H401131 Primary open-angle glaucoma, bilateral, mild stage: Secondary | ICD-10-CM

## 2022-05-19 DIAGNOSIS — H35073 Retinal telangiectasis, bilateral: Secondary | ICD-10-CM | POA: Diagnosis not present

## 2022-05-19 DIAGNOSIS — H35372 Puckering of macula, left eye: Secondary | ICD-10-CM

## 2022-05-19 DIAGNOSIS — H35352 Cystoid macular degeneration, left eye: Secondary | ICD-10-CM

## 2022-05-19 DIAGNOSIS — Z9889 Other specified postprocedural states: Secondary | ICD-10-CM

## 2022-05-19 HISTORY — DX: Other specified postprocedural states: Z98.890

## 2022-05-19 HISTORY — DX: Puckering of macula, left eye: H35.372

## 2022-05-19 HISTORY — DX: Cystoid macular degeneration, left eye: H35.352

## 2022-05-19 MED ORDER — KETOROLAC TROMETHAMINE 0.5 % OP SOLN: 1.0000 [drp] | Freq: Four times a day (QID) | OPHTHALMIC | 0 refills | Status: DC

## 2022-05-19 MED ORDER — TIMOLOL MALEATE 0.5 % OP SOLN: 1.0000 [drp] | Freq: Two times a day (BID) | OPHTHALMIC | 6 refills | Status: DC

## 2022-05-19 NOTE — Assessment & Plan Note (Signed)
Vitrectomy, removal of VMT, 2013.  No recurrence

## 2022-05-19 NOTE — Assessment & Plan Note (Signed)
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  Treat as daily with NSAIDs first

## 2022-05-19 NOTE — Assessment & Plan Note (Signed)
Physiologic stable 

## 2022-05-19 NOTE — Assessment & Plan Note (Signed)
Patient does compress mash or rub her eyes and she does have some prominence of the globes outside of the anterior orbital rim.  Also has an allergy suffer.  Recurrence not to compress mash or rub the eyes.  In the meantime we will start topical NSAID

## 2022-05-19 NOTE — Progress Notes (Signed)
05/19/2022     CHIEF COMPLAINT Patient presents for  Chief Complaint  Patient presents with   Retina Follow Up      HISTORY OF PRESENT ILLNESS: Alexis Molina is a 71 y.o. female who presents to the clinic today for:   HPI     Retina Follow Up           Diagnosis: Other   Laterality: both eyes   Severity: moderate   Course: stable         Comments   WIP- Macular edema, OS, ref by Gershon Crane. Pt stated vision has decreased about 6 months ago in the left eye. "I went to my doctor Dr. Gershon Crane last week and he sent me over here due to blurry vision." Pt states no new onset floaters. Pt reports when she closes her eyes in the evening time, she notices a ring of light coming across her vision.  Pt had surgery on June 2nd in hip. LATANOPROST-  drop into both eyes daily.      Last edited by Silvestre Moment on 05/19/2022  1:07 PM.      Referring physician: Kristen Loader, [place],  [place] [id number]  HISTORICAL INFORMATION:   Selected notes from the MEDICAL RECORD NUMBER       CURRENT MEDICATIONS: Current Outpatient Medications (Ophthalmic Drugs)  Medication Sig   ketorolac (ACULAR) 0.5 % ophthalmic solution Place 1 drop into the left eye 4 (four) times daily.   timolol (TIMOPTIC) 0.5 % ophthalmic solution Place 1 drop into the left eye 2 (two) times daily.   latanoprost (XALATAN) 0.005 % ophthalmic solution Place 1 drop into both eyes daily.   No current facility-administered medications for this visit. (Ophthalmic Drugs)   Current Outpatient Medications (Other)  Medication Sig   albuterol (VENTOLIN HFA) 108 (90 Base) MCG/ACT inhaler Inhale 2 puffs into the lungs every 4 (four) hours as needed for wheezing or shortness of breath.   diphenhydrAMINE (BENADRYL) 25 MG tablet Take 25 mg by mouth every 6 (six) hours as needed for itching.   levothyroxine (SYNTHROID) 75 MCG tablet Take 50 mcg by mouth daily.   lisinopril (PRINIVIL,ZESTRIL) 20 MG tablet  Take 10 mg by mouth daily.   Multiple Vitamin (MULTIVITAMIN WITH MINERALS) TABS tablet Take 1 tablet by mouth every other day. Gummy bears/ Unknown strength   nitroGLYCERIN (NITROSTAT) 0.4 MG SL tablet Place 0.4 mg under the tongue every 5 (five) minutes as needed for chest pain.   pantoprazole (PROTONIX) 40 MG tablet Take 1 tablet by mouth daily.   rosuvastatin (CRESTOR) 20 MG tablet Take 20 mg by mouth daily.   triamterene-hydrochlorothiazide (MAXZIDE) 75-50 MG tablet Take 1 tablet by mouth daily.   No current facility-administered medications for this visit. (Other)      REVIEW OF SYSTEMS: ROS   Negative for: Constitutional, Gastrointestinal, Neurological, Skin, Genitourinary, Musculoskeletal, HENT, Endocrine, Cardiovascular, Eyes, Respiratory, Psychiatric, Allergic/Imm, Heme/Lymph Last edited by Silvestre Moment on 05/19/2022  1:07 PM.       ALLERGIES Allergies  Allergen Reactions   Darvocet [Propoxyphene N-Acetaminophen] Other (See Comments)    Asthma attack   Orange Fruit [Citrus] Itching and Swelling   Vicodin [Hydrocodone-Acetaminophen] Hives and Itching   Dust Mite Extract Itching   Betamethasone Other (See Comments)     w/Marcaine, itching   Celecoxib Other (See Comments)    abd pain     Metformin Other (See Comments)    Other reaction(s): SEVERE diarrhea  Morphine Itching   Oxycodone-Acetaminophen Other (See Comments)    Other reaction(s): itching, swelling   Glimepiride Itching   Penicillins Hives   Sulfa Antibiotics Hives    PAST MEDICAL HISTORY Past Medical History:  Diagnosis Date   Abdominal pain 09/29/2020   Acute gastritis 07/22/2021   Allergic rhinitis    Anemia due to chronic blood loss 07/22/2021   Angina pectoris ([place]) 01/28/2017   Anosmia 12/18/2020   Anxiety    Body mass index (BMI) 30.0-30.9, adult 07/22/2021   Carpal tunnel syndrome of left wrist 06/03/2016   Cataracts, bilateral    Chest pain of uncertain etiology 1/65/5374   Chronic kidney  disease, stage 3b ([place]) 01/24/2022   Chronic left shoulder pain 09/18/2016   Diabetes mellitus without complication ([place])    Diabetic renal disease ([place]) 01/24/2022   Dysfunctional voiding of urine 02/24/2019   Dysgeusia 12/18/2020   Enteritis 09/28/2020   Essential hypertension 07/23/2021   Family history of colonic polyps 07/22/2021   Family history of premature CAD 05/11/2015   Fatigue    Fatty liver    Gastroesophageal reflux disease 07/22/2021   Generalized abdominal pain 09/29/2020   Hemangioma of intra-abdominal structure 07/22/2021   Hiatal hernia    History of colonic polyps    History of gastritis 07/22/2021   Hyperglycemia due to type 2 diabetes mellitus ([place]) 07/22/2021   Hyperlipidemia    Hypertension    Hypertensive disorder 12/08/1988   Hypokalemia    Hypomagnesemia    Hypoproteinemia ([place]) 07/22/2021   Hypothyroidism    Incontinence of feces with fecal urgency 02/24/2019   Irritable bowel syndrome with diarrhea 07/22/2021   Medication management 01/24/2022   Mixed dyslipidemia 07/23/2021   Neck pain 12/15/2016   Obesity, unspecified    Osteopenia of lumbar spine 07/22/2021   Other allergy status, other than to drugs and biological substances 07/22/2021   Other vitreous opacities, right eye 09/11/2020   Peripheral edema    Peripheral edema    after knee surgery   Posterior vitreous detachment of both eyes 09/11/2020   Prediabetes 07/22/2021   Primary open-angle glaucoma, bilateral, mild stage 01/24/2022   Problematic vaginal discharge 07/22/2021   Raynaud's disease 07/22/2021   Raynaud's syndrome without gangrene    Recurrent depression ([place]) 07/22/2021   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   S/P arthroscopy of left shoulder 04/07/2017   Shortness of breath 12/12/2019   Snake bite    Tear of left rotator cuff 12/15/2016   Trigger middle finger of left hand 01/24/2016   Tubular adenoma of colon    Type 2 diabetes mellitus with other specified complication ([place]) 07/09/7077   Urge  urinary incontinence 02/24/2019   Urinary urgency 09/18/2021   Vaginal lesion 07/22/2021   Vitamin D deficiency    Vitreous floaters of right eye    Vulvitis 07/22/2021   Past Surgical History:  Procedure Laterality Date   ABDOMINAL HYSTERECTOMY     BIOPSY  10/02/2020   Procedure: BIOPSY;  Surgeon: Wilford Corner, MD;  Location: [facility];  Service: Endoscopy;;   CARDIAC CATHETERIZATION N/A 07/30/2015   Procedure: Left Heart Cath and Coronary Angiography;  Surgeon: Jettie Booze, MD;  Location: [facility];  Service: Cardiovascular;  Laterality: N/A;   CARPAL TUNNEL RELEASE     CHOLECYSTECTOMY     ESOPHAGOGASTRODUODENOSCOPY (EGD) WITH PROPOFOL N/A 10/02/2020   Procedure: ESOPHAGOGASTRODUODENOSCOPY (EGD) WITH PROPOFOL;  Surgeon: Wilford Corner, MD;  Location: [facility];  Service: Endoscopy;  Laterality: N/A;  KNEE SURGERY     ROTATOR CUFF REPAIR Left    TUBAL LIGATION      FAMILY HISTORY Family History  Problem Relation Age of Onset   Alzheimer's disease Mother    Diabetes Mother    Hypertension Mother    Ulcers Father    Hypertension Father    Arthritis Father    Heart failure Father    Lung cancer Brother     SOCIAL HISTORY Social History   Tobacco Use   Smoking status: Former    Types: Cigarettes    Quit date: 12/09/1987    Years since quitting: 34.4   Smokeless tobacco: Never  Vaping Use   Vaping Use: Never used  Substance Use Topics   Alcohol use: No   Drug use: No         OPHTHALMIC EXAM:  Base Eye Exam     Visual Acuity (ETDRS)       Right Left   Dist [place] 20/40 20/50 -2   Dist ph [place] 20/20 NI         Tonometry (Tonopen, 1:15 PM)       Right Left   Pressure 14 27         Pupils       Pupils APD   Right PERRL None   Left PERRL None         Visual Fields       Left Right    Full Full         Extraocular Movement       Right Left    Full Full         Neuro/Psych     Oriented x3: Yes    Mood/Affect: Normal         Dilation     Both eyes: 1.0% Mydriacyl, 2.5% Phenylephrine @ 1:15 PM           Slit Lamp and Fundus Exam     External Exam       Right Left   External Normal Normal         Slit Lamp Exam       Right Left   Lids/Lashes Normal Normal   Conjunctiva/Sclera White and quiet White and quiet   Cornea Clear Clear   Anterior Chamber Deep and quiet Deep and quiet   Iris Round and reactive Round and reactive   Lens Centered posterior chamber intraocular lens, multifocal Centered posterior chamber intraocular lens, multifocal   Anterior Vitreous Normal Normal         Fundus Exam       Right Left   Posterior Vitreous Normal Posterior vitreous detachment   Disc Normal Normal   C/D Ratio 0.35 0.4   Macula Normal, no hemorrhage minor atrophy centrally   Vessels no DR no DR   Periphery Normal Normal            IMAGING AND PROCEDURES  Imaging and Procedures for 05/19/22  OCT, Retina - OU - Both Eyes       Right Eye Quality was good. Scan locations included subfoveal. Central Foveal Thickness: 294. Progression has been stable. Findings include normal foveal contour.   Left Eye Scan locations included subfoveal. Central Foveal Thickness: 339. Progression has been stable. Findings include cystoid macular edema, epiretinal membrane.   Notes OD PVD.  OS is post vitrectomy  OS with small amount of temporal CME and small serous collection subfoveal likely from overlying mild tangential traction from ERM  Color Fundus Photography Optos - OU - Both Eyes       Right Eye Progression has been stable. Disc findings include normal observations. Macula : normal observations. Vessels : normal observations. Periphery : normal observations.   Left Eye Progression has been stable. Disc findings include normal observations. Macula : normal observations. Vessels : normal observations.              ASSESSMENT/PLAN:  Primary open-angle  glaucoma, bilateral, mild stage OS, will add topical timolol 0.5% 1 drop left eye twice daily for intraocular pressure lowering  Posterior vitreous detachment of right eye Physiologic stable  History of vitrectomy Vitrectomy, removal of VMT, 2013.  No recurrence  Left epiretinal membrane OS, with tangential traction may be contributory to this findings left eye.  Treat as daily with NSAIDs first  Cystoid macular edema of left eye Patient does compress mash or rub her eyes and she does have some prominence of the globes outside of the anterior orbital rim.  Also has an allergy suffer.  Recurrence not to compress mash or rub the eyes.  In the meantime we will start topical NSAID     ICD-10-CM   1. 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 Primary open-angle glaucoma, bilateral, mild stage  H40.1131     3. Posterior vitreous detachment of right eye  H43.811     4. History of vitrectomy  Z98.890     5. Left epiretinal membrane  H35.372     6. Cystoid macular edema of left eye  H35.352       1.  OD, stable  2.  OS, history of VMT, post vitrectomy 10 years previous looks great, yet there is tangential traction from an epiretinal membrane developing this may be CME causative however there is also may be a component of compression of the globe by the patient because of allergy symptoms.  We will commence therapy with topical NSAIDs  3.  IOP OS still too high.  We will add topical timolol 1 drop left eye twice daily.  She is to stop this medication promptly if symptoms of wheezing or shortness of breath develop  Ophthalmic Meds Ordered this visit:  Meds ordered this encounter  Medications   timolol (TIMOPTIC) 0.5 % ophthalmic solution    Sig: Place 1 drop into the left eye 2 (two) times daily.    Dispense:  5 mL    Refill:  6   ketorolac (ACULAR) 0.5 % ophthalmic solution    Sig: Place 1  drop into the left eye 4 (four) times daily.    Dispense:  5 mL    Refill:  0       Return in about 5 weeks (around 06/23/2022) for NO DILATE, OCT.  There are no Patient Instructions on file for this visit.   Explained the diagnoses, plan, and follow up with the patient and they expressed understanding.  Patient expressed understanding of the importance of proper follow up care.   Clent Demark Angelicia Lessner M.D. Diseases & Surgery of the Retina and Vitreous Retina [facility] Diabetic [place] 05/19/22     Abbreviations: M myopia (nearsighted); A astigmatism; H hyperopia (farsighted); P presbyopia; Mrx spectacle prescription;  CTL contact lenses; OD right eye; OS left eye; OU both eyes  XT exotropia; ET esotropia; PEK punctate epithelial keratitis; PEE punctate epithelial erosions; DES dry  eye syndrome; MGD meibomian gland dysfunction; ATs artificial tears; PFAT's preservative free artificial tears; [place] nuclear sclerotic cataract; PSC posterior subcapsular cataract; ERM epi-retinal membrane; PVD posterior vitreous detachment; RD retinal detachment; DM diabetes mellitus; DR diabetic retinopathy; NPDR non-proliferative diabetic retinopathy; PDR proliferative diabetic retinopathy; CSME clinically significant macular edema; DME diabetic macular edema; dbh dot blot hemorrhages; CWS cotton wool spot; POAG primary open angle glaucoma; C/D cup-to-disc ratio; HVF humphrey visual field; GVF goldmann visual field; OCT optical coherence tomography; IOP intraocular pressure; BRVO Branch retinal vein occlusion; CRVO central retinal vein occlusion; CRAO central retinal artery occlusion; BRAO branch retinal artery occlusion; RT retinal tear; SB scleral buckle; PPV pars plana vitrectomy; VH Vitreous hemorrhage; PRP panretinal laser photocoagulation; IVK intravitreal kenalog; VMT vitreomacular traction; MH Macular hole;  NVD neovascularization of the disc; NVE neovascularization elsewhere; AREDS age related eye disease study; ARMD  age related macular degeneration; POAG primary open angle glaucoma; EBMD epithelial/anterior basement membrane dystrophy; ACIOL anterior chamber intraocular lens; IOL intraocular lens; PCIOL posterior chamber intraocular lens; Phaco/IOL phacoemulsification with intraocular lens placement; [place] photorefractive keratectomy; LASIK laser assisted in situ keratomileusis; HTN hypertension; DM diabetes mellitus; COPD chronic obstructive pulmonary disease

## 2022-05-19 NOTE — Assessment & Plan Note (Signed)
OS, will add topical timolol 0.5% 1 drop left eye twice daily for intraocular pressure lowering

## 2022-05-23 DIAGNOSIS — R82998 Other abnormal findings in urine: Secondary | ICD-10-CM | POA: Diagnosis not present

## 2022-05-23 DIAGNOSIS — R6889 Other general symptoms and signs: Secondary | ICD-10-CM | POA: Diagnosis not present

## 2022-05-23 DIAGNOSIS — R3915 Urgency of urination: Secondary | ICD-10-CM | POA: Diagnosis not present

## 2022-05-27 DIAGNOSIS — N1832 Chronic kidney disease, stage 3b: Secondary | ICD-10-CM | POA: Diagnosis not present

## 2022-05-27 DIAGNOSIS — M8588 Other specified disorders of bone density and structure, other site: Secondary | ICD-10-CM | POA: Diagnosis not present

## 2022-05-27 DIAGNOSIS — E1122 Type 2 diabetes mellitus with diabetic chronic kidney disease: Secondary | ICD-10-CM | POA: Diagnosis not present

## 2022-05-27 DIAGNOSIS — K760 Fatty (change of) liver, not elsewhere classified: Secondary | ICD-10-CM | POA: Diagnosis not present

## 2022-05-27 DIAGNOSIS — E1169 Type 2 diabetes mellitus with other specified complication: Secondary | ICD-10-CM | POA: Diagnosis not present

## 2022-05-27 DIAGNOSIS — I700 Atherosclerosis of aorta: Secondary | ICD-10-CM | POA: Diagnosis not present

## 2022-05-27 DIAGNOSIS — I10 Essential (primary) hypertension: Secondary | ICD-10-CM | POA: Diagnosis not present

## 2022-05-27 DIAGNOSIS — R6889 Other general symptoms and signs: Secondary | ICD-10-CM | POA: Diagnosis not present

## 2022-05-27 DIAGNOSIS — E782 Mixed hyperlipidemia: Secondary | ICD-10-CM | POA: Diagnosis not present

## 2022-06-11 ENCOUNTER — Other Ambulatory Visit (INDEPENDENT_AMBULATORY_CARE_PROVIDER_SITE_OTHER): Payer: Self-pay | Admitting: Ophthalmology

## 2022-06-24 ENCOUNTER — Ambulatory Visit (INDEPENDENT_AMBULATORY_CARE_PROVIDER_SITE_OTHER): Payer: Medicare HMO | Admitting: Ophthalmology

## 2022-06-24 ENCOUNTER — Encounter (INDEPENDENT_AMBULATORY_CARE_PROVIDER_SITE_OTHER): Payer: Self-pay | Admitting: Ophthalmology

## 2022-06-24 DIAGNOSIS — H401131 Primary open-angle glaucoma, bilateral, mild stage: Secondary | ICD-10-CM | POA: Diagnosis not present

## 2022-06-24 DIAGNOSIS — H35073 Retinal telangiectasis, bilateral: Secondary | ICD-10-CM

## 2022-06-24 DIAGNOSIS — H35352 Cystoid macular degeneration, left eye: Secondary | ICD-10-CM

## 2022-06-24 DIAGNOSIS — R6889 Other general symptoms and signs: Secondary | ICD-10-CM | POA: Diagnosis not present

## 2022-06-24 MED ORDER — KETOROLAC TROMETHAMINE 0.5 % OP SOLN: 1.0000 [drp] | Freq: Three times a day (TID) | OPHTHALMIC | 5 refills | Status: DC

## 2022-06-24 MED ORDER — KETOROLAC TROMETHAMINE 0.5 % OP SOLN: 1.0000 [drp] | Freq: Three times a day (TID) | OPHTHALMIC | 0 refills | Status: DC

## 2022-06-24 MED ORDER — TIMOLOL MALEATE 0.5 % OP SOLN
1.0000 [drp] | Freq: Two times a day (BID) | OPHTHALMIC | 6 refills | Status: AC
Start: 2022-06-24 — End: 2023-06-24

## 2022-06-24 NOTE — Progress Notes (Signed)
06/24/2022     CHIEF COMPLAINT Patient presents for  Chief Complaint  Patient presents with   Retina Follow Up      HISTORY OF PRESENT ILLNESS: Alexis Molina is a 71 y.o. female who presents to the clinic today for:   HPI     Retina Follow Up           Laterality: both eyes   Severity: moderate   Course: stable         Comments   5 weeks for NO DILATE, OCT. Pt stated there are no changes in vision.       Last edited by Silvestre Moment on 06/24/2022  1:36 PM.      Referring physician: Rutherford Guys, [address] [place],  [place] [id number]  HISTORICAL INFORMATION:   Selected notes from the MEDICAL RECORD NUMBER       CURRENT MEDICATIONS: Current Outpatient Medications (Ophthalmic Drugs)  Medication Sig   ketorolac (ACULAR) 0.5 % ophthalmic solution Place 1 drop into the left eye 3 (three) times daily.   latanoprost (XALATAN) 0.005 % ophthalmic solution Place 1 drop into both eyes daily.   timolol (TIMOPTIC) 0.5 % ophthalmic solution Place 1 drop into the left eye 2 (two) times daily.   No current facility-administered medications for this visit. (Ophthalmic Drugs)   Current Outpatient Medications (Other)  Medication Sig   albuterol (VENTOLIN HFA) 108 (90 Base) MCG/ACT inhaler Inhale 2 puffs into the lungs every 4 (four) hours as needed for wheezing or shortness of breath.   diphenhydrAMINE (BENADRYL) 25 MG tablet Take 25 mg by mouth every 6 (six) hours as needed for itching.   levothyroxine (SYNTHROID) 75 MCG tablet Take 50 mcg by mouth daily.   lisinopril (PRINIVIL,ZESTRIL) 20 MG tablet Take 10 mg by mouth daily.   Multiple Vitamin (MULTIVITAMIN WITH MINERALS) TABS tablet Take 1 tablet by mouth every other day. Gummy bears/ Unknown strength   nitroGLYCERIN (NITROSTAT) 0.4 MG SL tablet Place 0.4 mg under the tongue every 5 (five) minutes as needed for chest pain.   pantoprazole (PROTONIX) 40 MG tablet Take 1 tablet by mouth daily.   rosuvastatin  (CRESTOR) 20 MG tablet Take 20 mg by mouth daily.   triamterene-hydrochlorothiazide (MAXZIDE) 75-50 MG tablet Take 1 tablet by mouth daily.   No current facility-administered medications for this visit. (Other)      REVIEW OF SYSTEMS: ROS   Negative for: Constitutional, Gastrointestinal, Neurological, Skin, Genitourinary, Musculoskeletal, HENT, Endocrine, Cardiovascular, Eyes, Respiratory, Psychiatric, Allergic/Imm, Heme/Lymph Last edited by Silvestre Moment on 06/24/2022  1:36 PM.       ALLERGIES Allergies  Allergen Reactions   Darvocet [Propoxyphene N-Acetaminophen] Other (See Comments)    Asthma attack   Orange Fruit [Citrus] Itching and Swelling   Vicodin [Hydrocodone-Acetaminophen] Hives and Itching   Dust Mite Extract Itching   Betamethasone Other (See Comments)     w/Marcaine, itching   Celecoxib Other (See Comments)    abd pain     Metformin Other (See Comments)    Other reaction(s): SEVERE diarrhea   Morphine Itching   Oxycodone-Acetaminophen Other (See Comments)    Other reaction(s): itching, swelling   Glimepiride Itching   Penicillins Hives   Sulfa Antibiotics Hives    PAST MEDICAL HISTORY Past Medical History:  Diagnosis Date   Abdominal pain 09/29/2020   Acute gastritis 07/22/2021   Allergic rhinitis    Anemia due to chronic blood loss 07/22/2021   Angina pectoris ([place]) 01/28/2017  Anosmia 12/18/2020   Anxiety    Body mass index (BMI) 30.0-30.9, adult 07/22/2021   Carpal tunnel syndrome of left wrist 06/03/2016   Cataracts, bilateral    Chest pain of uncertain etiology 8/52/7782   Chronic kidney disease, stage 3b ([place]) 01/24/2022   Chronic left shoulder pain 09/18/2016   Diabetes mellitus without complication ([place])    Diabetic renal disease ([place]) 01/24/2022   Dysfunctional voiding of urine 02/24/2019   Dysgeusia 12/18/2020   Enteritis 09/28/2020   Essential hypertension 07/23/2021   Family history of colonic polyps 07/22/2021   Family history of premature CAD  05/11/2015   Fatigue    Fatty liver    Gastroesophageal reflux disease 07/22/2021   Generalized abdominal pain 09/29/2020   Hemangioma of intra-abdominal structure 07/22/2021   Hiatal hernia    History of colonic polyps    History of gastritis 07/22/2021   Hyperglycemia due to type 2 diabetes mellitus ([place]) 07/22/2021   Hyperlipidemia    Hypertension    Hypertensive disorder 12/08/1988   Hypokalemia    Hypomagnesemia    Hypoproteinemia ([place]) 07/22/2021   Hypothyroidism    Incontinence of feces with fecal urgency 02/24/2019   Irritable bowel syndrome with diarrhea 07/22/2021   Medication management 01/24/2022   Mixed dyslipidemia 07/23/2021   Neck pain 12/15/2016   Obesity, unspecified    Osteopenia of lumbar spine 07/22/2021   Other allergy status, other than to drugs and biological substances 07/22/2021   Other vitreous opacities, right eye 09/11/2020   Peripheral edema    Peripheral edema    after knee surgery   Posterior vitreous detachment of both eyes 09/11/2020   Prediabetes 07/22/2021   Primary open-angle glaucoma, bilateral, mild stage 01/24/2022   Problematic vaginal discharge 07/22/2021   Raynaud's disease 07/22/2021   Raynaud's syndrome without gangrene    Recurrent depression ([place]) 07/22/2021   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   S/P arthroscopy of left shoulder 04/07/2017   Shortness of breath 12/12/2019   Snake bite    Tear of left rotator cuff 12/15/2016   Trigger middle finger of left hand 01/24/2016   Tubular adenoma of colon    Type 2 diabetes mellitus with other specified complication ([place]) 03/10/3535   Urge urinary incontinence 02/24/2019   Urinary urgency 09/18/2021   Vaginal lesion 07/22/2021   Vitamin D deficiency    Vitreous floaters of right eye    Vulvitis 07/22/2021   Past Surgical History:  Procedure Laterality Date   ABDOMINAL HYSTERECTOMY     BIOPSY  10/02/2020   Procedure: BIOPSY;  Surgeon: Wilford Corner, MD;  Location: [facility];  Service:  Endoscopy;;   CARDIAC CATHETERIZATION N/A 07/30/2015   Procedure: Left Heart Cath and Coronary Angiography;  Surgeon: Jettie Booze, MD;  Location: [facility];  Service: Cardiovascular;  Laterality: N/A;   CARPAL TUNNEL RELEASE     CHOLECYSTECTOMY     ESOPHAGOGASTRODUODENOSCOPY (EGD) WITH PROPOFOL N/A 10/02/2020   Procedure: ESOPHAGOGASTRODUODENOSCOPY (EGD) WITH PROPOFOL;  Surgeon: Wilford Corner, MD;  Location: [facility];  Service: Endoscopy;  Laterality: N/A;   KNEE SURGERY     ROTATOR CUFF REPAIR Left    TUBAL LIGATION      FAMILY HISTORY Family History  Problem Relation Age of Onset   Alzheimer's disease Mother    Diabetes Mother    Hypertension Mother    Ulcers Father    Hypertension Father    Arthritis Father    Heart failure Father    Lung cancer  Brother     SOCIAL HISTORY Social History   Tobacco Use   Smoking status: Former    Types: Cigarettes    Quit date: 12/09/1987    Years since quitting: 34.5   Smokeless tobacco: Never  Vaping Use   Vaping Use: Never used  Substance Use Topics   Alcohol use: No   Drug use: No         OPHTHALMIC EXAM:  Base Eye Exam     Visual Acuity (ETDRS)       Right Left   Dist [place] 20/25 -2 20/40 -2   Dist ph [place]  NI         Tonometry (Tonopen, 1:41 PM)       Right Left   Pressure 14 16         Pupils       Pupils APD   Right PERRL None   Left PERRL None         Visual Fields       Left Right    Full Full         Extraocular Movement       Right Left    Full Full         Neuro/Psych     Oriented x3: Yes   Mood/Affect: Normal         Dilation     Both eyes: 1.0% Mydriacyl @ 1:41 PM           Slit Lamp and Fundus Exam     External Exam       Right Left   External Normal Normal         Slit Lamp Exam       Right Left   Lids/Lashes Normal Normal   Conjunctiva/Sclera White and quiet White and quiet   Cornea Clear Clear   Anterior Chamber Deep and  quiet Deep and quiet   Iris Round and reactive Round and reactive   Lens Centered posterior chamber intraocular lens, multifocal Centered posterior chamber intraocular lens, multifocal   Anterior Vitreous Normal Normal            IMAGING AND PROCEDURES  Imaging and Procedures for 06/24/22  OCT, Retina - OU - Both Eyes       Right Eye Quality was good. Scan locations included subfoveal. Central Foveal Thickness: 295. Progression has been stable. Findings include normal foveal contour.   Left Eye Scan locations included subfoveal. Central Foveal Thickness: 304. Progression has been stable. Findings include epiretinal membrane.   Notes OD PVD.  OS is post vitrectomy Improved CME OS and improved resolution of subretinal fluid.  On topical ketorolac.  Less thickening OS.             ASSESSMENT/PLAN:  Cystoid macular edema of left eye OS subfoveal fluid and CME have diminished on topical therapy, ketorolac 4 times daily.  We will taper the ketorolac now to 3 times daily to see if we can maintain this effect  Primary open-angle glaucoma, bilateral, mild stage Intraocular pressure previously in the mid 20s now improved to the mid teens on addition of timolol twice daily left eye     ICD-10-CM   1. Retinal telangiectasia of both eyes  H35.073 OCT, Retina - OU - Both Eyes    2. Cystoid macular edema of left eye  H35.352     3. Primary open-angle glaucoma, bilateral, mild stage  H40.1131       1.  OS subfoveal  serous fluid and CME have responded nicely with the addition of ketorolac 4 times daily.  We will continue to use ketorolac 3 times daily to prevent this recurrence and developing  2.  Secondarily intraocular pressure in the mid 20s in the left eye has also improved nicely now on topical timolol to the mid teens with the IOP.  We will continue timolol twice daily as well as latanoprost.  3.  Follow-up Dr. Rutherford Guys as scheduled  Ophthalmic Meds Ordered this  visit:  Meds ordered this encounter  Medications   timolol (TIMOPTIC) 0.5 % ophthalmic solution    Sig: Place 1 drop into the left eye 2 (two) times daily.    Dispense:  5 mL    Refill:  6   DISCONTD: ketorolac (ACULAR) 0.5 % ophthalmic solution    Sig: Place 1 drop into the left eye 3 (three) times daily.    Dispense:  5 mL    Refill:  0   ketorolac (ACULAR) 0.5 % ophthalmic solution    Sig: Place 1 drop into the left eye 3 (three) times daily.    Dispense:  5 mL    Refill:  5       Return in about 4 months (around 10/25/2022) for DILATE OU, OCT.  There are no Patient Instructions on file for this visit.   Explained the diagnoses, plan, and follow up with the patient and they expressed understanding.  Patient expressed understanding of the importance of proper follow up care.   Clent Demark Darus Hershman M.D. Diseases & Surgery of the Retina and Vitreous [facility] 06/24/22     Abbreviations: M myopia (nearsighted); A astigmatism; H hyperopia (farsighted); P presbyopia; Mrx spectacle prescription;  CTL contact lenses; OD right eye; OS left eye; OU both eyes  XT exotropia; ET esotropia; PEK punctate epithelial keratitis; PEE punctate epithelial erosions; DES dry eye syndrome; MGD meibomian gland dysfunction; ATs artificial tears; PFAT's preservative free artificial tears; [place] nuclear sclerotic cataract; PSC posterior subcapsular cataract; ERM epi-retinal membrane; PVD posterior vitreous detachment; RD retinal detachment; DM diabetes mellitus; DR diabetic retinopathy; NPDR non-proliferative diabetic retinopathy; PDR proliferative diabetic retinopathy; CSME clinically significant macular edema; DME diabetic macular edema; dbh dot blot hemorrhages; CWS cotton wool spot; POAG primary open angle glaucoma; C/D cup-to-disc ratio; HVF humphrey visual field; GVF goldmann visual field; OCT optical coherence tomography; IOP intraocular pressure; BRVO Branch retinal vein occlusion; CRVO  central retinal vein occlusion; CRAO central retinal artery occlusion; BRAO branch retinal artery occlusion; RT retinal tear; SB scleral buckle; PPV pars plana vitrectomy; VH Vitreous hemorrhage; PRP panretinal laser photocoagulation; IVK intravitreal kenalog; VMT vitreomacular traction; MH Macular hole;  NVD neovascularization of the disc; NVE neovascularization elsewhere; AREDS age related eye disease study; ARMD age related macular degeneration; POAG primary open angle glaucoma; EBMD epithelial/anterior basement membrane dystrophy; ACIOL anterior chamber intraocular lens; IOL intraocular lens; PCIOL posterior chamber intraocular lens; Phaco/IOL phacoemulsification with intraocular lens placement; [place] photorefractive keratectomy; LASIK laser assisted in situ keratomileusis; HTN hypertension; DM diabetes mellitus; COPD chronic obstructive pulmonary disease

## 2022-06-24 NOTE — Assessment & Plan Note (Signed)
Intraocular pressure previously in the mid 20s now improved to the mid teens on addition of timolol twice daily left eye

## 2022-06-24 NOTE — Assessment & Plan Note (Signed)
OS subfoveal fluid and CME have diminished on topical therapy, ketorolac 4 times daily.  We will taper the ketorolac now to 3 times daily to see if we can maintain this effect

## 2022-07-15 ENCOUNTER — Other Ambulatory Visit (INDEPENDENT_AMBULATORY_CARE_PROVIDER_SITE_OTHER): Payer: Self-pay | Admitting: Ophthalmology

## 2022-07-21 DIAGNOSIS — Z23 Encounter for immunization: Secondary | ICD-10-CM | POA: Diagnosis not present

## 2022-07-31 DIAGNOSIS — E1169 Type 2 diabetes mellitus with other specified complication: Secondary | ICD-10-CM | POA: Diagnosis not present

## 2022-07-31 DIAGNOSIS — E039 Hypothyroidism, unspecified: Secondary | ICD-10-CM | POA: Diagnosis not present

## 2022-07-31 DIAGNOSIS — E1122 Type 2 diabetes mellitus with diabetic chronic kidney disease: Secondary | ICD-10-CM | POA: Diagnosis not present

## 2022-07-31 DIAGNOSIS — I10 Essential (primary) hypertension: Secondary | ICD-10-CM | POA: Diagnosis not present

## 2022-07-31 DIAGNOSIS — E782 Mixed hyperlipidemia: Secondary | ICD-10-CM | POA: Diagnosis not present

## 2022-08-14 DIAGNOSIS — R6889 Other general symptoms and signs: Secondary | ICD-10-CM | POA: Diagnosis not present

## 2022-08-14 DIAGNOSIS — H401131 Primary open-angle glaucoma, bilateral, mild stage: Secondary | ICD-10-CM | POA: Diagnosis not present

## 2022-09-14 ENCOUNTER — Other Ambulatory Visit (INDEPENDENT_AMBULATORY_CARE_PROVIDER_SITE_OTHER): Payer: Self-pay | Admitting: Ophthalmology

## 2022-09-18 ENCOUNTER — Encounter (INDEPENDENT_AMBULATORY_CARE_PROVIDER_SITE_OTHER): Payer: Medicare HMO | Admitting: Ophthalmology

## 2022-09-19 DIAGNOSIS — R82998 Other abnormal findings in urine: Secondary | ICD-10-CM | POA: Diagnosis not present

## 2022-09-19 DIAGNOSIS — R159 Full incontinence of feces: Secondary | ICD-10-CM | POA: Diagnosis not present

## 2022-09-19 DIAGNOSIS — N3941 Urge incontinence: Secondary | ICD-10-CM | POA: Diagnosis not present

## 2022-09-19 DIAGNOSIS — R152 Fecal urgency: Secondary | ICD-10-CM | POA: Diagnosis not present

## 2022-10-22 ENCOUNTER — Ambulatory Visit (HOSPITAL_BASED_OUTPATIENT_CLINIC_OR_DEPARTMENT_OTHER): Payer: Medicare HMO | Admitting: Pulmonary Disease

## 2022-10-22 ENCOUNTER — Encounter (HOSPITAL_BASED_OUTPATIENT_CLINIC_OR_DEPARTMENT_OTHER): Payer: Self-pay | Admitting: Pulmonary Disease

## 2022-10-22 VITALS — BP 120/60 | HR 60 | Ht 62.0 in | Wt 154.4 lb

## 2022-10-22 DIAGNOSIS — T148XXA Other injury of unspecified body region, initial encounter: Secondary | ICD-10-CM | POA: Diagnosis not present

## 2022-10-22 DIAGNOSIS — R6889 Other general symptoms and signs: Secondary | ICD-10-CM | POA: Diagnosis not present

## 2022-10-22 NOTE — Patient Instructions (Signed)
Right upper shoulder/chest pain --REFER to physical therapy. Patient prefers [facility] location --Please contact our office if you find a preferred location for physical therapy and we are happy to send a prescription if needed

## 2022-10-22 NOTE — Progress Notes (Signed)
Subjective:   PATIENT ID: Alexis Molina GENDER: female DOB: 1951-11-07, MRN: [id number]   HPI  Chief Complaint  Patient presents with   Follow-up    If she is not working hard to breathe she's having chest pain. Cardo has been seen    Reason for Visit: Follow-up  [name] is a 71 year old female never smoker with obesity, allergic rhinitis, DM2, hypothyroidism, depression and fatty liver who presents for follow-up for chest pain and shortness of breath.   10/22/22 She was last seen in Pulmonary for shortness of breath in 2021. PFTs were normal and she underwent cardiac evaluation including CT coronary with no CAD, stress test in 08/2021 which was unremarkable and normal echo. Reports right sided chest/shoulder pain that is positional and will be associated with shortness of breath. Reproducible with palpation. Denies wheezing or cough. No reported dizziness, visual changes, reflux, nausea, diaphoresis or radiation of pain.  Social History: Social smoker for 3-4 years during break time. Quit 30 years ago  Environmental exposures:  Previously worked in Environmental education officer jobs.   Past Medical History:  Diagnosis Date   Abdominal pain 09/29/2020   Acute gastritis 07/22/2021   Allergic rhinitis    Anemia due to chronic blood loss 07/22/2021   Angina pectoris ([place]) 01/28/2017   Anosmia 12/18/2020   Anxiety    Body mass index (BMI) 30.0-30.9, adult 07/22/2021   Carpal tunnel syndrome of left wrist 06/03/2016   Cataracts, bilateral    Chest pain of uncertain etiology 6/57/8469   Chronic kidney disease, stage 3b ([place]) 01/24/2022   Chronic left shoulder pain 09/18/2016   Diabetes mellitus without complication ([place])    Diabetic renal disease ([place]) 01/24/2022   Dysfunctional voiding of urine 02/24/2019   Dysgeusia 12/18/2020   Enteritis 09/28/2020   Essential hypertension 07/23/2021   Family history of colonic polyps 07/22/2021   Family history of premature CAD 05/11/2015    Fatigue    Fatty liver    Gastroesophageal reflux disease 07/22/2021   Generalized abdominal pain 09/29/2020   Hemangioma of intra-abdominal structure 07/22/2021   Hiatal hernia    History of colonic polyps    History of gastritis 07/22/2021   Hyperglycemia due to type 2 diabetes mellitus ([place]) 07/22/2021   Hyperlipidemia    Hypertension    Hypertensive disorder 12/08/1988   Hypokalemia    Hypomagnesemia    Hypoproteinemia ([place]) 07/22/2021   Hypothyroidism    Incontinence of feces with fecal urgency 02/24/2019   Irritable bowel syndrome with diarrhea 07/22/2021   Medication management 01/24/2022   Mixed dyslipidemia 07/23/2021   Neck pain 12/15/2016   Obesity, unspecified    Osteopenia of lumbar spine 07/22/2021   Other allergy status, other than to drugs and biological substances 07/22/2021   Other vitreous opacities, right eye 09/11/2020   Peripheral edema    Peripheral edema    after knee surgery   Posterior vitreous detachment of both eyes 09/11/2020   Prediabetes 07/22/2021   Primary open-angle glaucoma, bilateral, mild stage 01/24/2022   Problematic vaginal discharge 07/22/2021   Raynaud's disease 07/22/2021   Raynaud's syndrome without gangrene    Recurrent depression ([place]) 07/22/2021   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   S/P arthroscopy of left shoulder 04/07/2017   Shortness of breath 12/12/2019   Snake bite    Tear of left rotator cuff 12/15/2016   Trigger middle finger of left hand 01/24/2016   Tubular adenoma of colon    Type  2 diabetes mellitus with other specified complication ([place]) 05/08/4430   Urge urinary incontinence 02/24/2019   Urinary urgency 09/18/2021   Vaginal lesion 07/22/2021   Vitamin D deficiency    Vitreous floaters of right eye    Vulvitis 07/22/2021     Family History  Problem Relation Age of Onset   Alzheimer's disease Mother    Diabetes Mother    Hypertension Mother    Ulcers Father    Hypertension Father    Arthritis Father    Heart failure  Father    Lung cancer Brother      Social History   Occupational History   Not on file  Tobacco Use   Smoking status: Former    Types: Cigarettes    Quit date: 12/09/1987    Years since quitting: 34.8   Smokeless tobacco: Never  Vaping Use   Vaping Use: Never used  Substance and Sexual Activity   Alcohol use: No   Drug use: No   Sexual activity: Not on file    Allergies  Allergen Reactions   Darvocet [Propoxyphene N-Acetaminophen] Other (See Comments)    Asthma attack   Orange Fruit [Citrus] Itching and Swelling   Vicodin [Hydrocodone-Acetaminophen] Hives and Itching   Dust Mite Extract Itching   Betamethasone Other (See Comments)     w/Marcaine, itching   Celecoxib Other (See Comments)    abd pain     Metformin Other (See Comments)    Other reaction(s): SEVERE diarrhea   Morphine Itching   Oxycodone-Acetaminophen Other (See Comments)    Other reaction(s): itching, swelling   Glimepiride Itching   Penicillins Hives   Sulfa Antibiotics Hives     Outpatient Medications Prior to Visit  Medication Sig Dispense Refill   albuterol (VENTOLIN HFA) 108 (90 Base) MCG/ACT inhaler Inhale 2 puffs into the lungs every 4 (four) hours as needed for wheezing or shortness of breath.     diphenhydrAMINE (BENADRYL) 25 MG tablet Take 25 mg by mouth every 6 (six) hours as needed for itching.     ketorolac (ACULAR) 0.5 % ophthalmic solution INSTILL 1 DROP INTO LEFT EYE 4 TIMES DAILY 5 mL 0   latanoprost (XALATAN) 0.005 % ophthalmic solution Place 1 drop into both eyes daily.     levothyroxine (SYNTHROID) 75 MCG tablet Take 50 mcg by mouth daily.     lisinopril (ZESTRIL) 10 MG tablet Take 10 mg by mouth daily.     pantoprazole (PROTONIX) 40 MG tablet Take 1 tablet by mouth daily.     rosuvastatin (CRESTOR) 20 MG tablet Take 20 mg by mouth daily.     timolol (TIMOPTIC) 0.5 % ophthalmic solution Place 1 drop into the left eye 2 (two) times daily. 5 mL 6   triamterene-hydrochlorothiazide  (MAXZIDE) 75-50 MG tablet Take 1 tablet by mouth daily.     lisinopril (PRINIVIL,ZESTRIL) 20 MG tablet Take 10 mg by mouth daily. (Patient not taking: Reported on 10/22/2022)     Multiple Vitamin (MULTIVITAMIN WITH MINERALS) TABS tablet Take 1 tablet by mouth every other day. Gummy bears/ Unknown strength (Patient not taking: Reported on 10/22/2022)     nitroGLYCERIN (NITROSTAT) 0.4 MG SL tablet Place 0.4 mg under the tongue every 5 (five) minutes as needed for chest pain. (Patient not taking: Reported on 10/22/2022)     No facility-administered medications prior to visit.    Review of Systems  Constitutional:  Negative for chills, diaphoresis, fever, malaise/fatigue and weight loss.  HENT:  Negative for congestion.  Respiratory:  Positive for shortness of breath. Negative for cough, hemoptysis, sputum production and wheezing.   Cardiovascular:  Positive for chest pain. Negative for palpitations and leg swelling.     Objective:   Vitals:   10/22/22 1309  BP: 120/60  Pulse: 60  SpO2: 99%  Weight: 154 lb 6.4 oz (70 kg)  Height: '5\' 2"'$  (1.575 m)   SpO2: 99 % O2 Device: None (Room air)  Physical Exam: General: Well-appearing, no acute distress HENT: [place], AT Eyes: EOMI, no scleral icterus Respiratory: Clear to auscultation bilaterally.  No crackles, wheezing or rales Cardiovascular: RRR, -M/R/G, no JVD Extremities:-Edema,-tenderness Neuro: AAO x4, CNII-XII grossly intact Psych: Normal mood, normal affect Musculoskeletal: Reproducible chest pain with deep palpation adjacent to the shoulder. Normal range of motion of right arm with some tightness reported.  Data Reviewed:  Imaging: CXR 06/23/13 - No infiltrate, edema or effusion  PFT: 10/22/22 FVC 2.55 (108%) FEV1 2.21 (120%) Ratio 84  TLC 92% DLCO 96% Interpretation: Normal PFTs  Sleep: Home sleep study 02/13/20 - Neg.    Assessment & Plan:   Discussion: 71 year old female never smoker with obesity, allergic rhinitis,  DM2, hypothyroidism, depression and fatty liver who presents for follow-up. Previously seen for shortness of breath and had negative pulmonary follow-up. Since then she has had negative cardiac work-up (CT coronary with no CAD, stress test in 08/2021 which was unremarkable and normal echo) as well. History reviewed with her current symptoms and after evaluation, this is likely musculoskeletal. Repeat pulmonary testing would be low yield and she would benefit from physical therapy.   Right upper shoulder/chest pain --REFER to physical therapy. Patient prefers [facility] location --Please contact our office if you find a preferred location for physical therapy and we are happy to send a prescription if needed --Discussed conservative management: heat, ice, stretching, exercise  --Completed/signed transportation sheet   Health Maintenance Immunization History  Administered Date(s) Administered   Fluad Quad(high Dose 65+) 10/07/2019   PFIZER(Purple Top)SARS-COV-2 Vaccination 01/29/2020, 02/22/2020   Pfizer Covid-19 Vaccine Bivalent Booster 31yr & up 07/21/2022   Pneumococcal Conjugate-13 10/15/2017   Pneumococcal Polysaccharide-23 10/21/2018   CT Lung Screen - not indicated  Orders Placed This Encounter  Procedures   Ambulatory referral to Physical Therapy    Referral Priority:   Routine    Referral Type:   Physical Medicine    Referral Reason:   Specialty Services Required    Requested Specialty:   Physical Therapy    Number of Visits Requested:   1  No orders of the defined types were placed in this encounter.   Return if symptoms worsen or fail to improve.   I have spent a total time of 45-minutes on the day of the appointment including chart review, data review, collecting history, coordinating care and discussing medical diagnosis and plan with the patient/family. Past medical history, allergies, medications were reviewed. Pertinent imaging, labs and tests included in this note have  been reviewed and interpreted independently by me.  CHanston MD [facility] 10/22/2022 1:21 PM  Office Number [phone]

## 2022-10-27 DIAGNOSIS — M25511 Pain in right shoulder: Secondary | ICD-10-CM | POA: Diagnosis not present

## 2022-10-27 DIAGNOSIS — R079 Chest pain, unspecified: Secondary | ICD-10-CM | POA: Diagnosis not present

## 2022-10-28 ENCOUNTER — Encounter (INDEPENDENT_AMBULATORY_CARE_PROVIDER_SITE_OTHER): Payer: Medicare HMO | Admitting: Ophthalmology

## 2022-10-29 ENCOUNTER — Encounter (HOSPITAL_BASED_OUTPATIENT_CLINIC_OR_DEPARTMENT_OTHER): Payer: Self-pay | Admitting: Pulmonary Disease

## 2022-11-04 DIAGNOSIS — M25511 Pain in right shoulder: Secondary | ICD-10-CM | POA: Diagnosis not present

## 2022-11-04 DIAGNOSIS — R079 Chest pain, unspecified: Secondary | ICD-10-CM | POA: Diagnosis not present

## 2022-11-06 DIAGNOSIS — M25511 Pain in right shoulder: Secondary | ICD-10-CM | POA: Diagnosis not present

## 2022-11-06 DIAGNOSIS — R079 Chest pain, unspecified: Secondary | ICD-10-CM | POA: Diagnosis not present

## 2022-11-11 DIAGNOSIS — R079 Chest pain, unspecified: Secondary | ICD-10-CM | POA: Diagnosis not present

## 2022-11-11 DIAGNOSIS — M25511 Pain in right shoulder: Secondary | ICD-10-CM | POA: Diagnosis not present

## 2022-11-13 DIAGNOSIS — R079 Chest pain, unspecified: Secondary | ICD-10-CM | POA: Diagnosis not present

## 2022-11-13 DIAGNOSIS — M25511 Pain in right shoulder: Secondary | ICD-10-CM | POA: Diagnosis not present

## 2022-11-18 DIAGNOSIS — R079 Chest pain, unspecified: Secondary | ICD-10-CM | POA: Diagnosis not present

## 2022-11-18 DIAGNOSIS — M25511 Pain in right shoulder: Secondary | ICD-10-CM | POA: Diagnosis not present

## 2022-11-20 DIAGNOSIS — Z1231 Encounter for screening mammogram for malignant neoplasm of breast: Secondary | ICD-10-CM | POA: Diagnosis not present

## 2022-11-25 DIAGNOSIS — H401131 Primary open-angle glaucoma, bilateral, mild stage: Secondary | ICD-10-CM | POA: Diagnosis not present

## 2022-11-26 DIAGNOSIS — H35372 Puckering of macula, left eye: Secondary | ICD-10-CM | POA: Diagnosis not present

## 2022-11-26 DIAGNOSIS — H43391 Other vitreous opacities, right eye: Secondary | ICD-10-CM | POA: Diagnosis not present

## 2022-11-26 DIAGNOSIS — H35073 Retinal telangiectasis, bilateral: Secondary | ICD-10-CM | POA: Diagnosis not present

## 2022-11-26 DIAGNOSIS — H35352 Cystoid macular degeneration, left eye: Secondary | ICD-10-CM | POA: Diagnosis not present

## 2022-11-26 DIAGNOSIS — H43811 Vitreous degeneration, right eye: Secondary | ICD-10-CM | POA: Diagnosis not present

## 2022-11-26 DIAGNOSIS — H401131 Primary open-angle glaucoma, bilateral, mild stage: Secondary | ICD-10-CM | POA: Diagnosis not present

## 2022-11-27 DIAGNOSIS — E1169 Type 2 diabetes mellitus with other specified complication: Secondary | ICD-10-CM | POA: Diagnosis not present

## 2022-11-27 DIAGNOSIS — I10 Essential (primary) hypertension: Secondary | ICD-10-CM | POA: Diagnosis not present

## 2022-11-27 DIAGNOSIS — N1832 Chronic kidney disease, stage 3b: Secondary | ICD-10-CM | POA: Diagnosis not present

## 2022-11-27 DIAGNOSIS — R079 Chest pain, unspecified: Secondary | ICD-10-CM | POA: Diagnosis not present

## 2022-11-27 DIAGNOSIS — E039 Hypothyroidism, unspecified: Secondary | ICD-10-CM | POA: Diagnosis not present

## 2022-11-27 DIAGNOSIS — Z1389 Encounter for screening for other disorder: Secondary | ICD-10-CM | POA: Diagnosis not present

## 2022-11-27 DIAGNOSIS — Z6828 Body mass index (BMI) 28.0-28.9, adult: Secondary | ICD-10-CM | POA: Diagnosis not present

## 2022-11-27 DIAGNOSIS — Z9109 Other allergy status, other than to drugs and biological substances: Secondary | ICD-10-CM | POA: Diagnosis not present

## 2022-11-27 DIAGNOSIS — K760 Fatty (change of) liver, not elsewhere classified: Secondary | ICD-10-CM | POA: Diagnosis not present

## 2022-11-27 DIAGNOSIS — E782 Mixed hyperlipidemia: Secondary | ICD-10-CM | POA: Diagnosis not present

## 2022-11-27 DIAGNOSIS — Z0000 Encounter for general adult medical examination without abnormal findings: Secondary | ICD-10-CM | POA: Diagnosis not present

## 2022-11-27 LAB — LAB REPORT - SCANNED
A1c: 6.4
Albumin, Urine POC: 0.7
Creatinine, Urine.: 61
EGFR: 58
Microalb Creat Ratio: 11.4

## 2022-11-28 DIAGNOSIS — M25511 Pain in right shoulder: Secondary | ICD-10-CM | POA: Diagnosis not present

## 2022-11-28 DIAGNOSIS — R079 Chest pain, unspecified: Secondary | ICD-10-CM | POA: Diagnosis not present

## 2022-12-11 DIAGNOSIS — R6889 Other general symptoms and signs: Secondary | ICD-10-CM | POA: Diagnosis not present

## 2022-12-11 DIAGNOSIS — H9202 Otalgia, left ear: Secondary | ICD-10-CM | POA: Diagnosis not present

## 2022-12-11 DIAGNOSIS — Z6828 Body mass index (BMI) 28.0-28.9, adult: Secondary | ICD-10-CM | POA: Diagnosis not present

## 2022-12-11 DIAGNOSIS — L509 Urticaria, unspecified: Secondary | ICD-10-CM | POA: Diagnosis not present

## 2022-12-16 IMAGING — US US RENAL
1 series · 14 of 25 positions shown · non-contrast
Comparison: None.

CLINICAL DATA: Abnormal renal function

EXAM:
RENAL / URINARY TRACT ULTRASOUND COMPLETE

[Series 1: us renal · 14 of 29 slices shown]
[im 1/29]
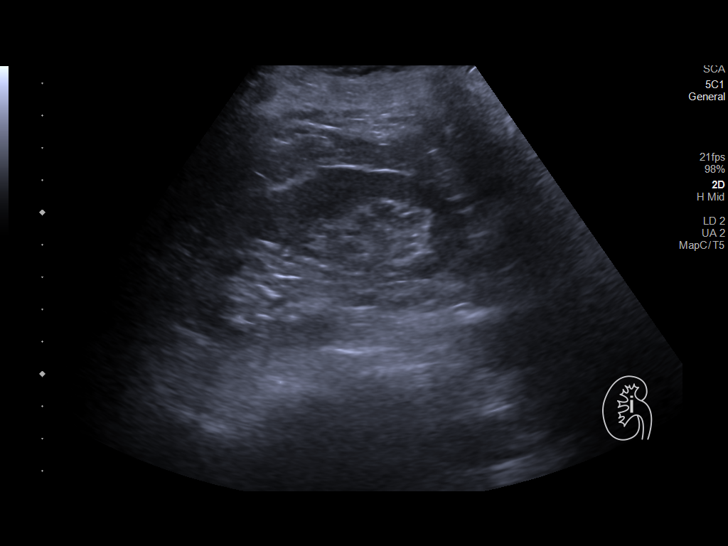
[im 3/29]
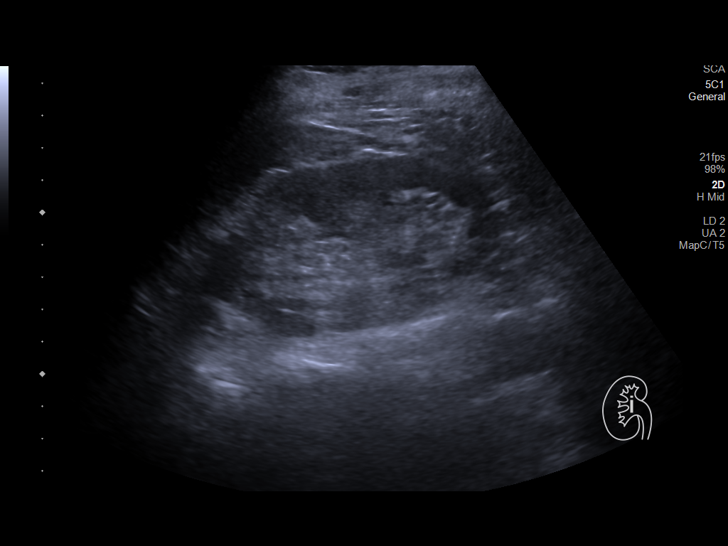
[im 5/29]
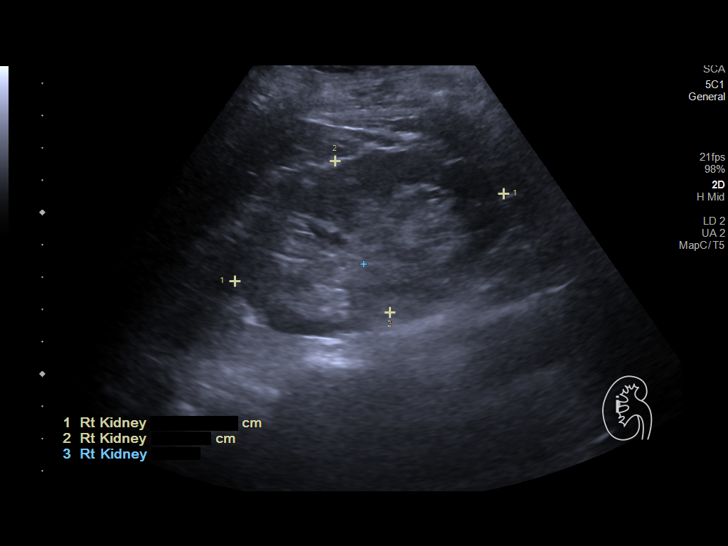
[im 8/29]
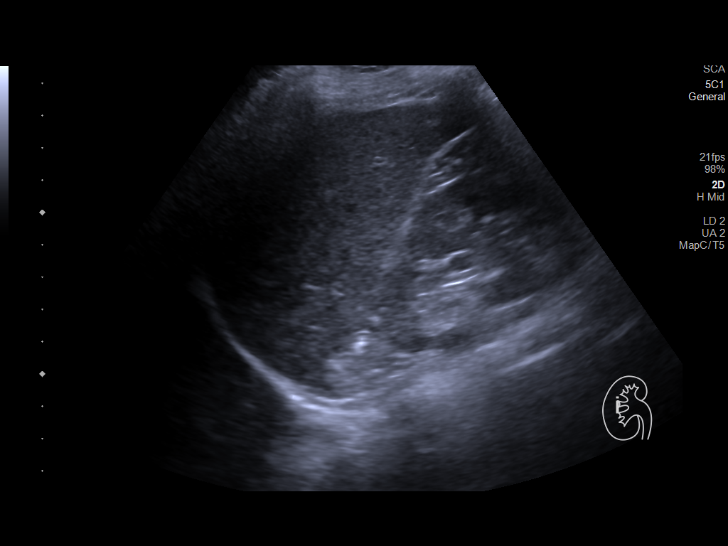
[im 10/29]
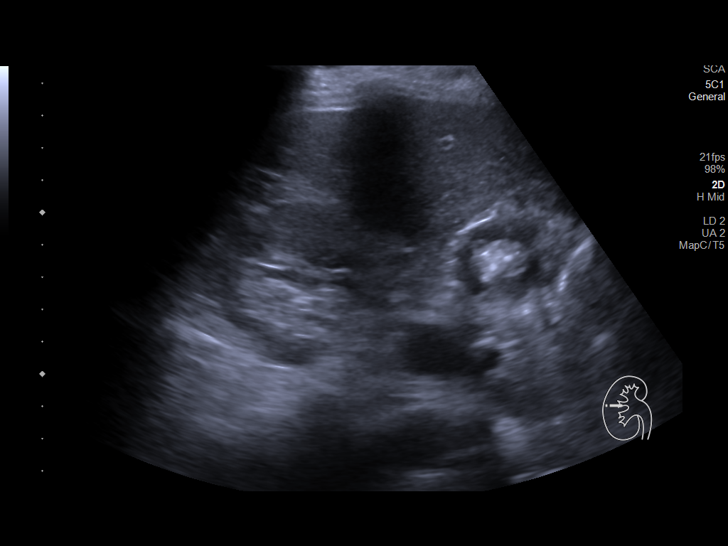
[im 11/29]
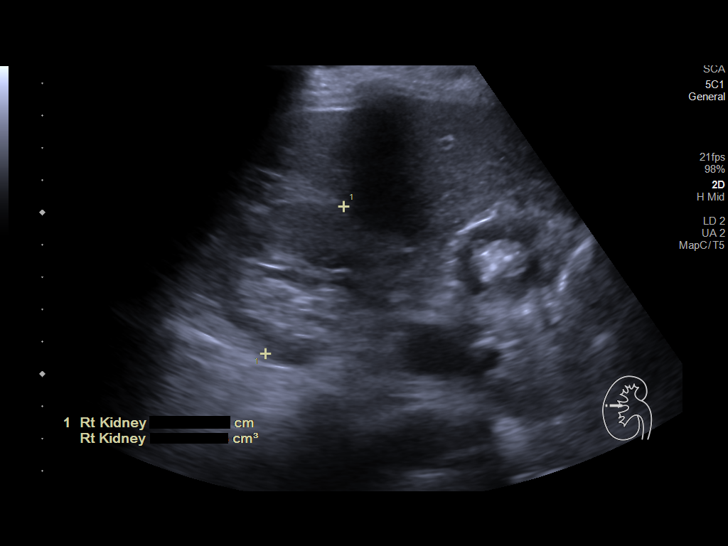
[im 13/29]
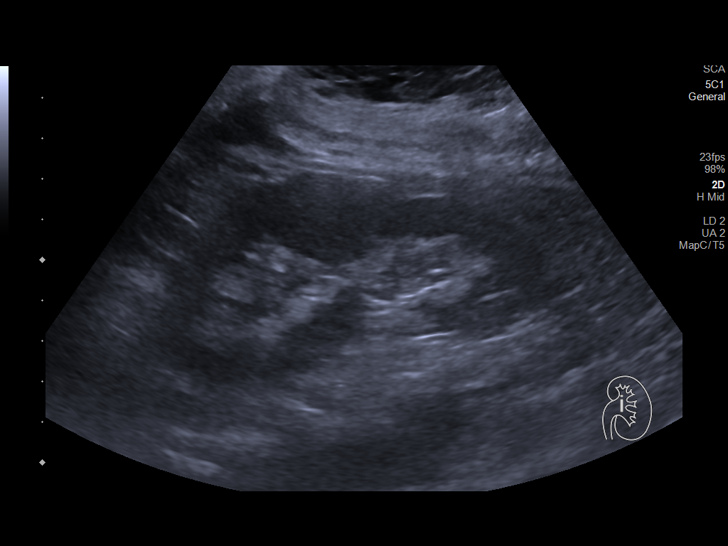
[im 16/29]
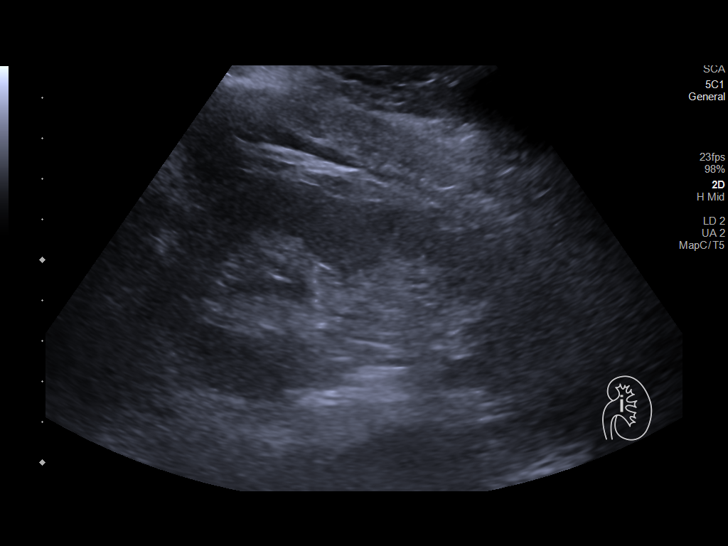
[im 18/29]
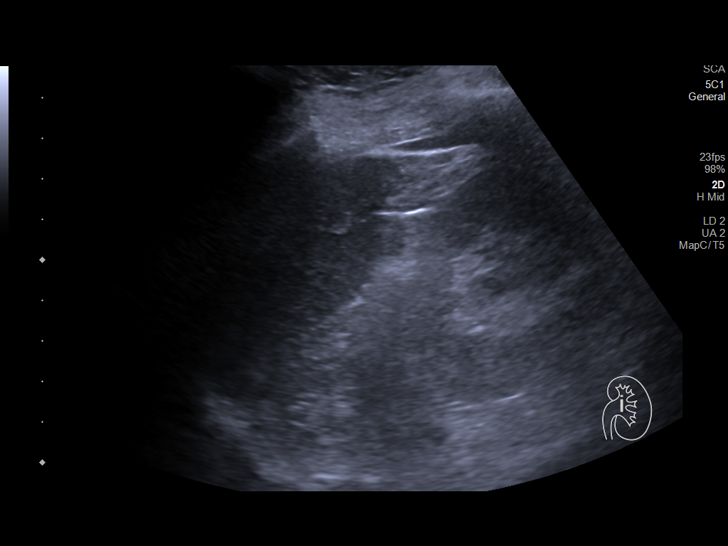
[im 19/29]
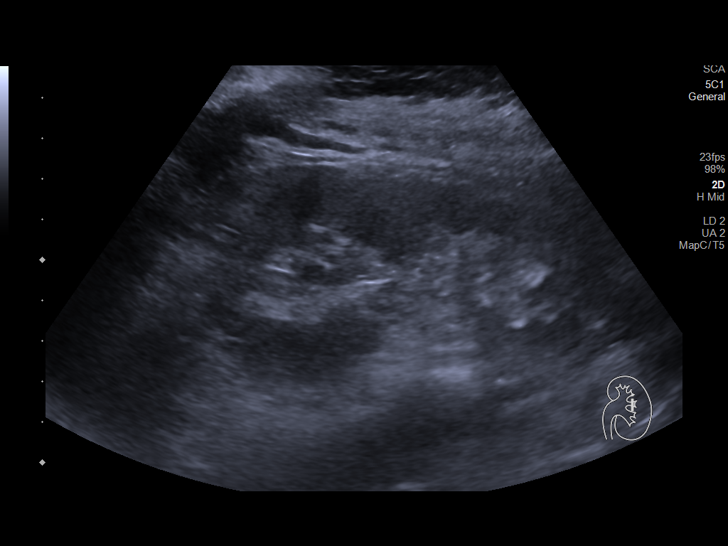
[im 22/29]
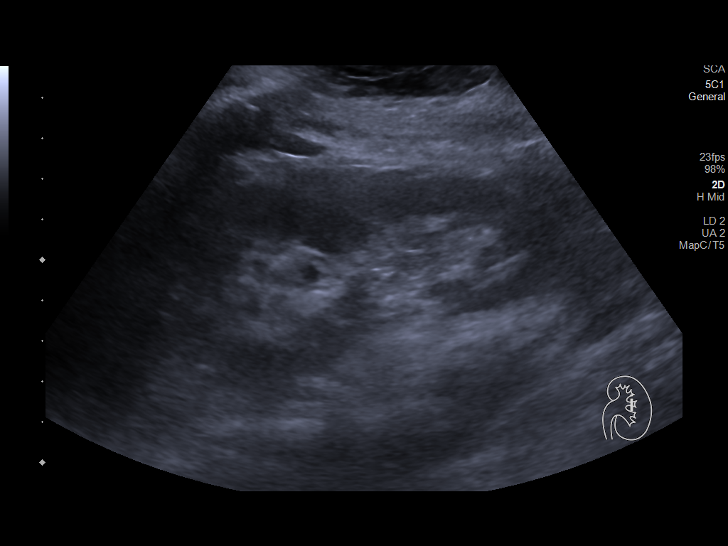
[im 24/29]
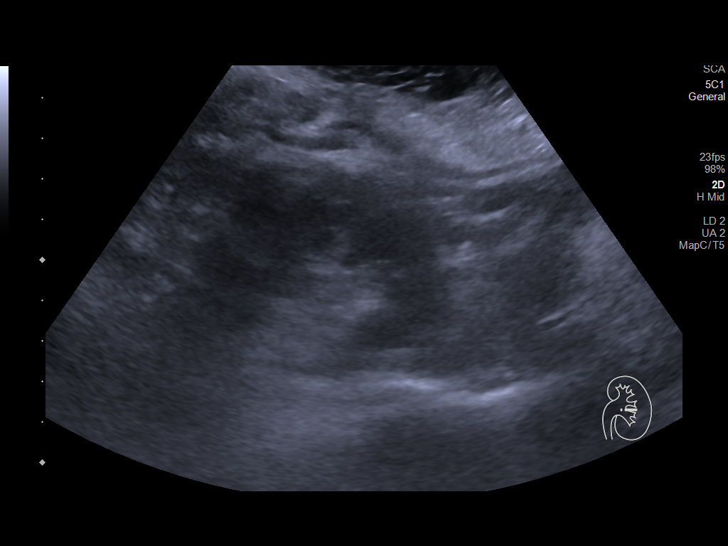
[im 26/29]
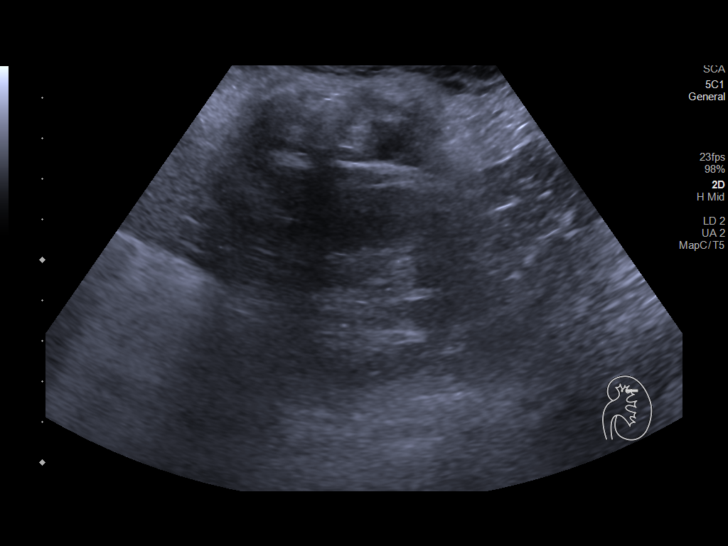
[im 29/29]
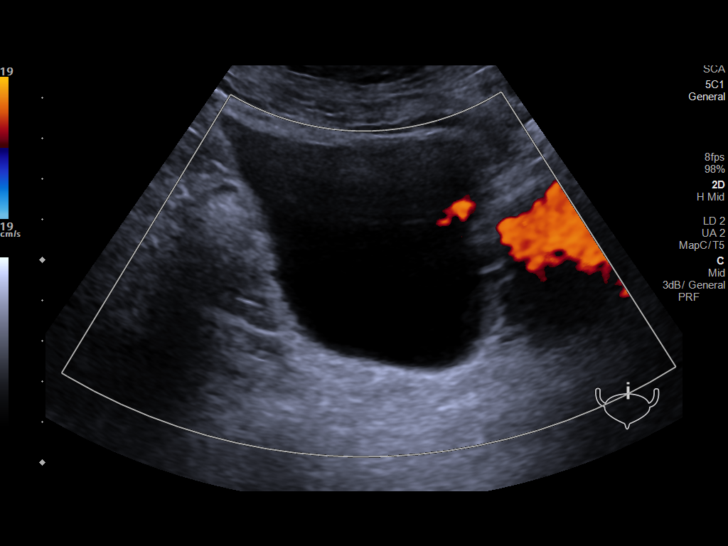

[14 of 25 positions shown; findings below may reference images not displayed]

FINDINGS: Right Kidney:

Renal measurements: 8.8 x 5.0 x 5.2 cm = volume: 117 mL.
Echogenicity within normal limits. No mass or hydronephrosis
visualized.

Left Kidney:

Renal measurements: 10.0 x 4.6 x 4.6 cm = volume: 109 mL.
Echogenicity within normal limits. No mass or hydronephrosis
visualized.

Bladder:

Appears normal for degree of bladder distention.

Other:

None.
IMPRESSION: Normal renal sonogram

## 2022-12-24 DIAGNOSIS — R6889 Other general symptoms and signs: Secondary | ICD-10-CM | POA: Diagnosis not present

## 2022-12-24 DIAGNOSIS — H401131 Primary open-angle glaucoma, bilateral, mild stage: Secondary | ICD-10-CM | POA: Diagnosis not present

## 2022-12-24 DIAGNOSIS — I7300 Raynaud's syndrome without gangrene: Secondary | ICD-10-CM | POA: Diagnosis not present

## 2022-12-24 DIAGNOSIS — K219 Gastro-esophageal reflux disease without esophagitis: Secondary | ICD-10-CM | POA: Diagnosis not present

## 2022-12-24 DIAGNOSIS — L700 Acne vulgaris: Secondary | ICD-10-CM | POA: Diagnosis not present

## 2022-12-24 DIAGNOSIS — S46812A Strain of other muscles, fascia and tendons at shoulder and upper arm level, left arm, initial encounter: Secondary | ICD-10-CM | POA: Diagnosis not present

## 2022-12-24 DIAGNOSIS — R430 Anosmia: Secondary | ICD-10-CM | POA: Diagnosis not present

## 2022-12-24 DIAGNOSIS — E1165 Type 2 diabetes mellitus with hyperglycemia: Secondary | ICD-10-CM | POA: Diagnosis not present

## 2022-12-24 DIAGNOSIS — Z6828 Body mass index (BMI) 28.0-28.9, adult: Secondary | ICD-10-CM | POA: Diagnosis not present

## 2023-01-19 DIAGNOSIS — L299 Pruritus, unspecified: Secondary | ICD-10-CM | POA: Diagnosis not present

## 2023-01-19 DIAGNOSIS — R6889 Other general symptoms and signs: Secondary | ICD-10-CM | POA: Diagnosis not present

## 2023-01-19 DIAGNOSIS — R052 Subacute cough: Secondary | ICD-10-CM | POA: Diagnosis not present

## 2023-01-19 DIAGNOSIS — J3089 Other allergic rhinitis: Secondary | ICD-10-CM | POA: Diagnosis not present

## 2023-01-19 DIAGNOSIS — T781XXD Other adverse food reactions, not elsewhere classified, subsequent encounter: Secondary | ICD-10-CM | POA: Diagnosis not present

## 2023-02-06 ENCOUNTER — Telehealth: Payer: Self-pay | Admitting: Pulmonary Disease

## 2023-02-06 DIAGNOSIS — H903 Sensorineural hearing loss, bilateral: Secondary | ICD-10-CM | POA: Diagnosis not present

## 2023-02-06 DIAGNOSIS — R6889 Other general symptoms and signs: Secondary | ICD-10-CM | POA: Diagnosis not present

## 2023-02-06 DIAGNOSIS — H9202 Otalgia, left ear: Secondary | ICD-10-CM | POA: Diagnosis not present

## 2023-02-06 DIAGNOSIS — H6122 Impacted cerumen, left ear: Secondary | ICD-10-CM | POA: Diagnosis not present

## 2023-02-06 NOTE — Telephone Encounter (Signed)
Patient states still having upper chest pains. Patient phone number is [phone].

## 2023-02-06 NOTE — Telephone Encounter (Signed)
Called pt but unable to reach. Left message for pt to return call. 

## 2023-02-06 NOTE — Telephone Encounter (Signed)
Please call back again.  Patient states still having upper chest pains

## 2023-02-10 NOTE — Telephone Encounter (Signed)
Called and spoke to pt. Pt states the chest discomfort she had when she was seen by JE on 10/22/22 is still present. Pt did PT for 4-5 weeks, last day was in late December. Pt states the discomfort is intermittent and presents when walking, lifting, or bending over. Pt denies any worsening shortness of breath, dizziness, wheezing. Pt states the muscle relaxers help temporarily. Pt called PCP on3/4 in hopes he will order an MRI. Pt requesting recs from Dr. Loanne Drilling as well. Thanks.

## 2023-02-10 NOTE — Telephone Encounter (Signed)
Based on the symptoms described it sounds atypical for lung issues especially in the absence of any shortness of breath, wheezing or coughing. I recommend following through work-up with PCP.

## 2023-02-11 NOTE — Telephone Encounter (Signed)
Called and spoke with pt letting her know the info stated by Dr. Loanne Drilling and she verbalized understanding. Nothing further needed.

## 2023-02-18 ENCOUNTER — Other Ambulatory Visit: Payer: Self-pay | Admitting: Family Medicine

## 2023-02-18 DIAGNOSIS — H9202 Otalgia, left ear: Secondary | ICD-10-CM

## 2023-02-18 DIAGNOSIS — R430 Anosmia: Secondary | ICD-10-CM

## 2023-02-18 DIAGNOSIS — H9313 Tinnitus, bilateral: Secondary | ICD-10-CM

## 2023-02-18 DIAGNOSIS — M542 Cervicalgia: Secondary | ICD-10-CM

## 2023-02-18 DIAGNOSIS — H903 Sensorineural hearing loss, bilateral: Secondary | ICD-10-CM

## 2023-03-02 DIAGNOSIS — H401131 Primary open-angle glaucoma, bilateral, mild stage: Secondary | ICD-10-CM | POA: Diagnosis not present

## 2023-03-02 DIAGNOSIS — E119 Type 2 diabetes mellitus without complications: Secondary | ICD-10-CM | POA: Diagnosis not present

## 2023-03-02 DIAGNOSIS — Z961 Presence of intraocular lens: Secondary | ICD-10-CM | POA: Diagnosis not present

## 2023-03-02 DIAGNOSIS — H524 Presbyopia: Secondary | ICD-10-CM | POA: Diagnosis not present

## 2023-03-04 ENCOUNTER — Ambulatory Visit
Admission: RE | Admit: 2023-03-04 | Discharge: 2023-03-04 | Disposition: A | Discharge status: Home / Self Care | Payer: Medicare HMO | Source: Ambulatory Visit | Attending: Family Medicine | Admitting: Family Medicine

## 2023-03-04 DIAGNOSIS — R430 Anosmia: Secondary | ICD-10-CM

## 2023-03-04 DIAGNOSIS — H9313 Tinnitus, bilateral: Secondary | ICD-10-CM

## 2023-03-04 DIAGNOSIS — M542 Cervicalgia: Secondary | ICD-10-CM

## 2023-03-04 DIAGNOSIS — M50222 Other cervical disc displacement at C5-C6 level: Secondary | ICD-10-CM | POA: Diagnosis not present

## 2023-03-04 DIAGNOSIS — R439 Unspecified disturbances of smell and taste: Secondary | ICD-10-CM | POA: Diagnosis not present

## 2023-03-04 DIAGNOSIS — H903 Sensorineural hearing loss, bilateral: Secondary | ICD-10-CM

## 2023-03-04 DIAGNOSIS — M50223 Other cervical disc displacement at C6-C7 level: Secondary | ICD-10-CM | POA: Diagnosis not present

## 2023-03-04 DIAGNOSIS — M50221 Other cervical disc displacement at C4-C5 level: Secondary | ICD-10-CM | POA: Diagnosis not present

## 2023-03-04 DIAGNOSIS — M5023 Other cervical disc displacement, cervicothoracic region: Secondary | ICD-10-CM | POA: Diagnosis not present

## 2023-03-04 DIAGNOSIS — R6889 Other general symptoms and signs: Secondary | ICD-10-CM | POA: Diagnosis not present

## 2023-03-04 DIAGNOSIS — H9202 Otalgia, left ear: Secondary | ICD-10-CM

## 2023-03-04 DIAGNOSIS — I6782 Cerebral ischemia: Secondary | ICD-10-CM | POA: Diagnosis not present

## 2023-03-08 ENCOUNTER — Other Ambulatory Visit: Payer: Medicare HMO

## 2023-03-25 DIAGNOSIS — Z6830 Body mass index (BMI) 30.0-30.9, adult: Secondary | ICD-10-CM | POA: Diagnosis not present

## 2023-03-25 DIAGNOSIS — M542 Cervicalgia: Secondary | ICD-10-CM | POA: Diagnosis not present

## 2023-03-25 DIAGNOSIS — R6889 Other general symptoms and signs: Secondary | ICD-10-CM | POA: Diagnosis not present

## 2023-03-27 DIAGNOSIS — R42 Dizziness and giddiness: Secondary | ICD-10-CM | POA: Diagnosis not present

## 2023-03-27 DIAGNOSIS — R079 Chest pain, unspecified: Secondary | ICD-10-CM | POA: Diagnosis not present

## 2023-03-27 DIAGNOSIS — R0789 Other chest pain: Secondary | ICD-10-CM | POA: Diagnosis not present

## 2023-03-27 DIAGNOSIS — R112 Nausea with vomiting, unspecified: Secondary | ICD-10-CM | POA: Diagnosis not present

## 2023-03-30 ENCOUNTER — Telehealth: Payer: Self-pay | Admitting: Cardiology

## 2023-03-30 DIAGNOSIS — R0602 Shortness of breath: Secondary | ICD-10-CM | POA: Diagnosis not present

## 2023-03-30 NOTE — Telephone Encounter (Signed)
Pt reports that she is having right upper chest pain and increased shortness of breath with movement. Appointment made for pt.

## 2023-03-30 NOTE — Telephone Encounter (Signed)
Pt c/o of Chest Pain: STAT if CP now or developed within 24 hours  1. Are you having CP right now?  No   2. Are you experiencing any other symptoms (ex. SOB, nausea, vomiting, sweating)?  Pain in left neck/ear/eye--saw ENT/allergy specialist  3. How long have you been experiencing CP?  Upper right CP, has been going on for years, but it has recently gotten worse  4. Is your CP continuous or coming and going?  Coming and going, mainly occurs when walking or bending over   5. Have you taken Nitroglycerin?  No  ?

## 2023-03-31 ENCOUNTER — Other Ambulatory Visit: Payer: Self-pay

## 2023-03-31 DIAGNOSIS — R052 Subacute cough: Secondary | ICD-10-CM

## 2023-03-31 DIAGNOSIS — L299 Pruritus, unspecified: Secondary | ICD-10-CM

## 2023-03-31 DIAGNOSIS — L501 Idiopathic urticaria: Secondary | ICD-10-CM

## 2023-03-31 HISTORY — DX: Subacute cough: R05.2

## 2023-03-31 HISTORY — DX: Idiopathic urticaria: L50.1

## 2023-03-31 HISTORY — DX: Pruritus, unspecified: L29.9

## 2023-04-01 ENCOUNTER — Telehealth: Payer: Self-pay | Admitting: Cardiology

## 2023-04-01 ENCOUNTER — Encounter: Payer: Self-pay | Admitting: Cardiology

## 2023-04-01 ENCOUNTER — Ambulatory Visit: Payer: Medicare HMO | Attending: Cardiology | Admitting: Cardiology

## 2023-04-01 VITALS — BP 110/64 | HR 71 | Ht 62.0 in | Wt 167.6 lb

## 2023-04-01 DIAGNOSIS — E119 Type 2 diabetes mellitus without complications: Secondary | ICD-10-CM

## 2023-04-01 DIAGNOSIS — R6889 Other general symptoms and signs: Secondary | ICD-10-CM | POA: Diagnosis not present

## 2023-04-01 DIAGNOSIS — I700 Atherosclerosis of aorta: Secondary | ICD-10-CM

## 2023-04-01 DIAGNOSIS — R072 Precordial pain: Secondary | ICD-10-CM | POA: Diagnosis not present

## 2023-04-01 DIAGNOSIS — I10 Essential (primary) hypertension: Secondary | ICD-10-CM | POA: Diagnosis not present

## 2023-04-01 DIAGNOSIS — E782 Mixed hyperlipidemia: Secondary | ICD-10-CM

## 2023-04-01 DIAGNOSIS — I209 Angina pectoris, unspecified: Secondary | ICD-10-CM

## 2023-04-01 HISTORY — DX: Atherosclerosis of aorta: I70.0

## 2023-04-01 MED ORDER — METOPROLOL TARTRATE 100 MG PO TABS
100.0000 mg | ORAL_TABLET | Freq: Once | ORAL | 0 refills | Status: DC
Start: 2023-04-01 — End: 2024-01-08

## 2023-04-01 NOTE — Patient Instructions (Signed)
Medication Instructions:  Your physician recommends that you continue on your current medications as directed. Please refer to the Current Medication list given to you today.   *If you need a refill on your cardiac medications before your next appointment, please call your pharmacy*   Lab Work: Your physician recommends that you have a BMP today in the office.   If you have labs (blood work) drawn today and your tests are completely normal, you will receive your results only by: MyChart Message (if you have MyChart) OR A paper copy in the mail If you have any lab test that is abnormal or we need to change your treatment, we will call you to review the results.   Testing/Procedures:   Your cardiac CT will be scheduled at one of the below locations:   [facility] [address] [place], [place] [address] [phone]  If scheduled at [facility], please arrive at the [facility] and Children's Entrance (Entrance C2) of [facility] 30 minutes prior to test start time. You can use the FREE valet parking offered at entrance C (encouraged to control the heart rate for the test)  Proceed to the [facility] Radiology Department (first floor) to check-in and test prep.  All radiology patients and guests should use entrance C2 at [facility], accessed from [facility], even though the hospital's physical address listed is [address]     Please follow these instructions carefully (unless otherwise directed):  On the Night Before the Test: Be sure to Drink plenty of water. Do not consume any caffeinated/decaffeinated beverages or chocolate 12 hours prior to your test. Do not take any antihistamines 12 hours prior to your test.   On the Day of the Test: Drink plenty of water until 1 hour prior to the test. Do not eat any food 1 hour prior to test. You may take your regular medications prior to the test.  Take metoprolol  (Lopressor) two hours prior to test. This will be a one time dose. FEMALES- please wear underwire-free bra if available, avoid dresses & tight clothing       After the Test: Drink plenty of water. After receiving IV contrast, you may experience a mild flushed feeling. This is normal. On occasion, you may experience a mild rash up to 24 hours after the test. This is not dangerous. If this occurs, you can take Benadryl 25 mg and increase your fluid intake. If you experience trouble breathing, this can be serious. If it is severe call 911 IMMEDIATELY. If it is mild, please call our office. If you take any of these medications: Glipizide/Metformin, Avandament, Glucavance, please do not take 48 hours after completing test unless otherwise instructed.  We will call to schedule your test 2-4 weeks out understanding that some insurance companies will need an authorization prior to the service being performed.   For non-scheduling related questions, please contact the cardiac imaging nurse navigator should you have any questions/concerns: Rockwell Alexandria, Cardiac Imaging Nurse Navigator Larey Brick, Cardiac Imaging Nurse Navigator [facility] Direct Office Dial: [phone]   For scheduling needs, including cancellations and rescheduling, please call [place], [phone].   Your next appointment:   9 month(s)  The format for your next appointment:   In Person  Provider:   Belva Crome, MD   Other Instructions Cardiac CT Angiogram A cardiac CT angiogram is a procedure to look at the heart and the area around  the heart. It may be done to help find the cause of chest pains or other symptoms of heart disease. During this procedure, a substance called contrast dye is injected into the blood vessels in the area to be checked. A large X-ray machine, called a CT scanner, then takes detailed pictures of the heart and the surrounding area. The procedure is also sometimes  called a coronary CT angiogram, coronary artery scanning, or CTA. A cardiac CT angiogram allows the health care provider to see how well blood is flowing to and from the heart. The health care provider will be able to see if there are any problems, such as: Blockage or narrowing of the coronary arteries in the heart. Fluid around the heart. Signs of weakness or disease in the muscles, valves, and tissues of the heart. Tell a health care provider about: Any allergies you have. This is especially important if you have had a previous allergic reaction to contrast dye. All medicines you are taking, including vitamins, herbs, eye drops, creams, and over-the-counter medicines. Any blood disorders you have. Any surgeries you have had. Any medical conditions you have. Whether you are pregnant or may be pregnant. Any anxiety disorders, chronic pain, or other conditions you have that may increase your stress or prevent you from lying still. What are the risks? Generally, this is a safe procedure. However, problems may occur, including: Bleeding. Infection. Allergic reactions to medicines or dyes. Damage to other structures or organs. Kidney damage from the contrast dye that is used. Increased risk of cancer from radiation exposure. This risk is low. Talk with your health care provider about: The risks and benefits of testing. How you can receive the lowest dose of radiation. What happens before the procedure? Wear comfortable clothing and remove any jewelry, glasses, dentures, and hearing aids. Follow instructions from your health care provider about eating and drinking. This may include: For 12 hours before the procedure -- avoid caffeine. This includes tea, coffee, soda, energy drinks, and diet pills. Drink plenty of water or other fluids that do not have caffeine in them. Being well hydrated can prevent complications. For 4-6 hours before the procedure -- stop eating and drinking. The contrast  dye can cause nausea, but this is less likely if your stomach is empty. Ask your health care provider about changing or stopping your regular medicines. This is especially important if you are taking diabetes medicines, blood thinners, or medicines to treat problems with erections (erectile dysfunction). What happens during the procedure?  Hair on your chest may need to be removed so that small sticky patches called electrodes can be placed on your chest. These will transmit information that helps to monitor your heart during the procedure. An IV will be inserted into one of your veins. You might be given a medicine to control your heart rate during the procedure. This will help to ensure that good images are obtained. You will be asked to lie on an exam table. This table will slide in and out of the CT machine during the procedure. Contrast dye will be injected into the IV. You might feel warm, or you may get a metallic taste in your mouth. You will be given a medicine called nitroglycerin. This will relax or dilate the arteries in your heart. The table that you are lying on will move into the CT machine tunnel for the scan. The person running the machine will give you instructions while the scans are being done. You may be asked  to: Keep your arms above your head. Hold your breath. Stay very still, even if the table is moving. When the scanning is complete, you will be moved out of the machine. The IV will be removed. The procedure may vary among health care providers and hospitals. What can I expect after the procedure? After your procedure, it is common to have: A metallic taste in your mouth from the contrast dye. A feeling of warmth. A headache from the nitroglycerin. Follow these instructions at home: Take over-the-counter and prescription medicines only as told by your health care provider. If you are told, drink enough fluid to keep your urine pale yellow. This will help to flush  the contrast dye out of your body. Most people can return to their normal activities right after the procedure. Ask your health care provider what activities are safe for you. It is up to you to get the results of your procedure. Ask your health care provider, or the department that is doing the procedure, when your results will be ready. Keep all follow-up visits as told by your health care provider. This is important. Contact a health care provider if: You have any symptoms of allergy to the contrast dye. These include: Shortness of breath. Rash or hives. A racing heartbeat. Summary A cardiac CT angiogram is a procedure to look at the heart and the area around the heart. It may be done to help find the cause of chest pains or other symptoms of heart disease. During this procedure, a large X-ray machine, called a CT scanner, takes detailed pictures of the heart and the surrounding area after a contrast dye has been injected into blood vessels in the area. Ask your health care provider about changing or stopping your regular medicines before the procedure. This is especially important if you are taking diabetes medicines, blood thinners, or medicines to treat erectile dysfunction. If you are told, drink enough fluid to keep your urine pale yellow. This will help to flush the contrast dye out of your body. This information is not intended to replace advice given to you by your health care provider. Make sure you discuss any questions you have with your health care provider. Document Revised: 07/20/2019 Document Reviewed: 07/20/2019 Elsevier Patient Education  2020 [employer].

## 2023-04-01 NOTE — Telephone Encounter (Signed)
Advised that she had talked with radiology staff and they answered her question. No other questions

## 2023-04-01 NOTE — Progress Notes (Signed)
Cardiology Office Note:    Date:  04/01/2023   ID:  Alexis Molina, Park Meo 09-15-1951, MRN [id number]  PCP:  Soundra Pilon, FNP  Cardiologist:  Garwin Brothers, MD   Referring MD: Soundra Pilon, FNP    ASSESSMENT:    1. Essential hypertension   2. Aortic atherosclerosis   3. Angina pectoris (HCC)   4. Diabetes mellitus without complication   5. Mixed hyperlipidemia    PLAN:    In order of problems listed above:  Primary prevention stressed with the patient.  Importance of compliance with diet medications stressed and she will place. Essential hypertension: Blood pressure stable diabetes emphasized. Angina pectoris: Patient has multiple risk factors for coronary artery disease.  Various modalities of evaluation pros and cons were discussed and she provide CT coronary angiography and we will set her up for this. sublingual nitroglycerin prescription was sent, its protocol and 911 protocol explained and the patient vocalized understanding questions were answered to the patient's satisfaction Mix dyslipidemia: On lipid-lowering medications followed by primary care. Diabetes mellitus and obesity weight reduction stressed diet emphasized and she promises to do better. Patient will be seen in follow-up appointment in 6 months or earlier if the patient has any concerns.    Medication Adjustments/Labs and Tests Ordered: Current medicines are reviewed at length with the patient today.  Concerns regarding medicines are outlined above.  No orders of the defined types were placed in this encounter.  No orders of the defined types were placed in this encounter.    No chief complaint on file.    History of Present Illness:    Alexis Molina is a 72 y.o. female.  Patient has past medical history of essential hypertension, mixed dyslipidemia, diabetes mellitus and aortic atherosclerosis.  She is on guideline directed therapy.  She mentions to me that on exertion she has chest  tightness on the right upper aspect of her chest.  No orthopnea or PND.  This is concerning to her so she came here for an appointment.  At the time of my evaluation, the patient is alert awake oriented and in no distress.  She mentions to me that she was washing her car the other day and it was very hot and humid she did not well and she came in and threw up.  Subsequently after that she has been fine.  At the time of my evaluation, the patient is alert awake oriented and in no distress.  Past Medical History:  Diagnosis Date   Abdominal pain 09/29/2020   Acute gastritis 07/22/2021   Allergic rhinitis    Anemia due to chronic blood loss 07/22/2021   Angina pectoris 01/28/2017   Anosmia 12/18/2020   Anxiety    Body mass index (BMI) 30.0-30.9, adult 07/22/2021   Carpal tunnel syndrome of left wrist 06/03/2016   Cataracts, bilateral    Chest pain of uncertain etiology 07/23/2021   Chronic kidney disease, stage 3b 01/24/2022   Chronic left shoulder pain 09/18/2016   Cystoid macular edema of left eye 05/19/2022   New onset   Diabetes mellitus without complication    Diabetic renal disease 01/24/2022   Dysfunctional voiding of urine 02/24/2019   Dysgeusia 12/18/2020   Enteritis 09/28/2020   Essential hypertension 07/23/2021   Family history of colonic polyps 07/22/2021   Family history of premature CAD 05/11/2015   Fatigue    Fatty liver    Gastroesophageal reflux disease 07/22/2021   Generalized abdominal pain 09/29/2020  Hemangioma of intra-abdominal structure 07/22/2021   Hiatal hernia    History of colonic polyps    History of gastritis 07/22/2021   History of vitrectomy 05/19/2022   Hyperglycemia due to type 2 diabetes mellitus 07/22/2021   Hyperlipidemia    Hypertension    Hypertensive disorder 12/08/1988   Hypokalemia    Hypomagnesemia    Hypoproteinemia 07/22/2021   Hypothyroidism    Idiopathic urticaria 03/31/2023   Incontinence of feces with fecal urgency 02/24/2019    Irritable bowel syndrome with diarrhea 07/22/2021   Left epiretinal membrane 05/19/2022   Medication management 01/24/2022   Mixed dyslipidemia 07/23/2021   Neck pain 12/15/2016   Obesity, unspecified    Osteopenia of lumbar spine 07/22/2021   Other allergy status, other than to drugs and biological substances 07/22/2021   Other vitreous opacities, right eye 09/11/2020   Peripheral edema    Peripheral edema    after knee surgery   Posterior vitreous detachment of both eyes 09/11/2020   Posterior vitreous detachment of right eye 09/11/2020   Prediabetes 07/22/2021   Primary open-angle glaucoma, bilateral, mild stage 01/24/2022   Problematic vaginal discharge 07/22/2021   Pruritus 03/31/2023   Raynaud's disease 07/22/2021   Raynaud's syndrome without gangrene    Recurrent depression 07/22/2021   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   S/P arthroscopy of left shoulder 04/07/2017   Shortness of breath 12/12/2019   Snake bite    Subacute cough 03/31/2023   Tear of left rotator cuff 12/15/2016   Trigger middle finger of left hand 01/24/2016   Tubular adenoma of colon    Type 2 diabetes mellitus with other specified complication 05/11/2015   Urge urinary incontinence 02/24/2019   Urinary urgency 09/18/2021   Vaginal lesion 07/22/2021   Vitamin D deficiency    Vitreous floaters of right eye    Vulvitis 07/22/2021    Past Surgical History:  Procedure Laterality Date   ABDOMINAL HYSTERECTOMY     BIOPSY  10/02/2020   Procedure: BIOPSY;  Surgeon: Charlott Rakes, MD;  Location: [facility] ENDOSCOPY;  Service: Endoscopy;;   CARDIAC CATHETERIZATION N/A 07/30/2015   Procedure: Left Heart Cath and Coronary Angiography;  Surgeon: Corky Crafts, MD;  Location: [facility] INVASIVE CV LAB;  Service: Cardiovascular;  Laterality: N/A;   CARPAL TUNNEL RELEASE     CHOLECYSTECTOMY     ESOPHAGOGASTRODUODENOSCOPY (EGD) WITH PROPOFOL N/A 10/02/2020   Procedure: ESOPHAGOGASTRODUODENOSCOPY  (EGD) WITH PROPOFOL;  Surgeon: Charlott Rakes, MD;  Location: [facility] ENDOSCOPY;  Service: Endoscopy;  Laterality: N/A;   KNEE SURGERY     ROTATOR CUFF REPAIR Left    TUBAL LIGATION      Current Medications: Current Meds  Medication Sig   albuterol (VENTOLIN HFA) 108 (90 Base) MCG/ACT inhaler Inhale 2 puffs into the lungs every 4 (four) hours as needed for wheezing or shortness of breath.   diphenhydrAMINE (BENADRYL) 25 MG tablet Take 25 mg by mouth every 6 (six) hours as needed for itching.   fluticasone (FLONASE) 50 MCG/ACT nasal spray Place 1-2 sprays into both nostrils daily.   hydrOXYzine (ATARAX) 25 MG tablet Take 25 mg by mouth 3 (three) times daily.   ketorolac (ACULAR) 0.5 % ophthalmic solution INSTILL 1 DROP INTO LEFT EYE 4 TIMES DAILY   latanoprost (XALATAN) 0.005 % ophthalmic solution Place 1 drop into both eyes daily.   levothyroxine (SYNTHROID) 50 MCG tablet Take 50 mcg by mouth daily.   lisinopril (ZESTRIL) 10 MG tablet Take 10 mg by mouth daily.  meloxicam (MOBIC) 7.5 MG tablet Take 7.5 mg by mouth daily.   methocarbamol (ROBAXIN) 750 MG tablet Take 750 mg by mouth at bedtime.   montelukast (SINGULAIR) 10 MG tablet Take 10 mg by mouth daily.   Multiple Vitamin (MULTIVITAMIN WITH MINERALS) TABS tablet Take 1 tablet by mouth every other day. Gummy bears/ Unknown strength   nitroGLYCERIN (NITROSTAT) 0.4 MG SL tablet Place 0.4 mg under the tongue every 5 (five) minutes as needed for chest pain.   pantoprazole (PROTONIX) 40 MG tablet Take 1 tablet by mouth daily.   rosuvastatin (CRESTOR) 20 MG tablet Take 20 mg by mouth daily.   timolol (TIMOPTIC) 0.5 % ophthalmic solution Place 1 drop into the left eye 2 (two) times daily.   triamterene-hydrochlorothiazide (MAXZIDE) 75-50 MG tablet Take 1 tablet by mouth daily.     Allergies:   Darvocet [propoxyphene n-acetaminophen], Orange fruit [citrus], Vicodin [hydrocodone-acetaminophen], Dust mite extract, Betamethasone, Celecoxib,  Metformin, Morphine, Oxycodone-acetaminophen, Glimepiride, Penicillins, and Sulfa antibiotics   Social History   Socioeconomic History   Marital status: Widowed    Spouse name: Not on file   Number of children: Not on file   Years of education: Not on file   Highest education level: Not on file  Occupational History   Not on file  Tobacco Use   Smoking status: Former    Types: Cigarettes    Quit date: 12/09/1987    Years since quitting: 35.3   Smokeless tobacco: Never  Vaping Use   Vaping Use: Never used  Substance and Sexual Activity   Alcohol use: No   Drug use: No   Sexual activity: Not on file  Other Topics Concern   Not on file  Social History Narrative   Not on file   Social Determinants of Health   Financial Resource Strain: Not on file  Food Insecurity: Not on file  Transportation Needs: Not on file  Physical Activity: Not on file  Stress: Not on file  Social Connections: Not on file     Family History: The patient's family history includes Alzheimer's disease in her mother; Arthritis in her father; Diabetes in her mother; Heart failure in her father; Hypertension in her father and mother; Lung cancer in her brother; Ulcers in her father.  ROS:   Please see the history of present illness.    All other systems reviewed and are negative.  EKGs/Labs/Other Studies Reviewed:    The following studies were reviewed today: EKG is with sinus tach with nonspecific ST-T changes   Recent Labs: No results found for requested labs within last 365 days.  Recent Lipid Panel No results found for: "CHOL", "TRIG", "HDL", "CHOLHDL", "VLDL", "LDLCALC", "LDLDIRECT"  Physical Exam:    VS:  BP 110/64   Pulse 71   Ht 5\' 2"  (1.575 m)   Wt 167 lb 9.6 oz (76 kg)   SpO2 98%   BMI 30.65 kg/m     Wt Readings from Last 3 Encounters:  04/01/23 167 lb 9.6 oz (76 kg)  10/22/22 154 lb 6.4 oz (70 kg)  01/28/22 144 lb 12.8 oz (65.7 kg)     GEN: Patient is in no acute  distress HEENT: Normal NECK: No JVD; No carotid bruits LYMPHATICS: No lymphadenopathy CARDIAC: Hear sounds regular, 2/6 systolic murmur at the apex. RESPIRATORY:  Clear to auscultation without rales, wheezing or rhonchi  ABDOMEN: Soft, non-tender, non-distended MUSCULOSKELETAL:  No edema; No deformity  SKIN: Warm and dry NEUROLOGIC:  Alert and oriented x 3  PSYCHIATRIC:  Normal affect   Signed, Garwin Brothers, MD  04/01/2023 8:31 AM    [facility]

## 2023-04-01 NOTE — Telephone Encounter (Signed)
Pt c/o medication issue:  1. Name of Medication: metoprolol tartrate (LOPRESSOR) 100 MG tablet   2. How are you currently taking this medication (dosage and times per day)? Take 1 tablet (100 mg total) by mouth once for 1 dose. Take 2 hours prior to your CT if your heart rate is greater than 55   3. Are you having a reaction (difficulty breathing--STAT)? No  4. What is your medication issue? Pt would like a call back regarding this medication after reading about it she seen it could cause low blood sugar and low blood pressure and now she's really concerned. Please advise.

## 2023-04-02 LAB — BASIC METABOLIC PANEL
BUN/Creatinine Ratio: 14 (ref 12–28)
BUN: 18 mg/dL (ref 8–27)
CO2: 25 mmol/L (ref 20–29)
Calcium: 9.5 mg/dL (ref 8.7–10.3)
Chloride: 102 mmol/L (ref 96–106)
Creatinine, Ser: 1.27 mg/dL — ABNORMAL HIGH (ref 0.57–1.00)
Glucose: 67 mg/dL — ABNORMAL LOW (ref 70–99)
Potassium: 4.3 mmol/L (ref 3.5–5.2)
Sodium: 142 mmol/L (ref 134–144)
eGFR: 45 mL/min/{1.73_m2} — ABNORMAL LOW (ref >59)

## 2023-04-06 ENCOUNTER — Telehealth (HOSPITAL_COMMUNITY): Payer: Self-pay | Admitting: Emergency Medicine

## 2023-04-06 DIAGNOSIS — M47812 Spondylosis without myelopathy or radiculopathy, cervical region: Secondary | ICD-10-CM | POA: Diagnosis not present

## 2023-04-06 DIAGNOSIS — R6889 Other general symptoms and signs: Secondary | ICD-10-CM | POA: Diagnosis not present

## 2023-04-06 DIAGNOSIS — Z6830 Body mass index (BMI) 30.0-30.9, adult: Secondary | ICD-10-CM | POA: Diagnosis not present

## 2023-04-06 NOTE — Telephone Encounter (Signed)
Reaching out to patient to offer assistance regarding upcoming cardiac imaging study; pt verbalizes understanding of appt date/time, parking situation and where to check in, pre-test NPO status and medications ordered, and verified current allergies; name and call back number provided for further questions should they arise Alexis Alexandria RN Navigator Cardiac Imaging [facility] [phone] office [phone] cell  Arrival 200  100mg  metoprolol  Holding maxide Aware contrast/nitro

## 2023-04-07 ENCOUNTER — Ambulatory Visit (HOSPITAL_COMMUNITY)
Admission: RE | Admit: 2023-04-07 | Discharge: 2023-04-07 | Disposition: A | Discharge status: Home / Self Care | Payer: Medicare HMO | Source: Ambulatory Visit | Attending: Cardiology | Admitting: Cardiology

## 2023-04-07 DIAGNOSIS — R6889 Other general symptoms and signs: Secondary | ICD-10-CM | POA: Diagnosis not present

## 2023-04-07 DIAGNOSIS — R072 Precordial pain: Secondary | ICD-10-CM | POA: Insufficient documentation

## 2023-04-07 DIAGNOSIS — I209 Angina pectoris, unspecified: Secondary | ICD-10-CM

## 2023-04-07 MED ORDER — NITROGLYCERIN 0.4 MG SL SUBL
0.8000 mg | SUBLINGUAL_TABLET | Freq: Once | SUBLINGUAL | Status: AC
  Administered 2023-04-07: 0.8 mg via SUBLINGUAL

## 2023-04-07 MED ORDER — IOHEXOL 350 MG/ML SOLN
95.0000 mL | Freq: Once | INTRAVENOUS | Status: AC | PRN
  Administered 2023-04-07: 95 mL via INTRAVENOUS

## 2023-04-07 MED ORDER — NITROGLYCERIN 0.4 MG SL SUBL
SUBLINGUAL_TABLET | SUBLINGUAL | Status: AC
  Filled 2023-04-07: qty 2

## 2023-04-10 ENCOUNTER — Telehealth: Payer: Self-pay

## 2023-04-10 DIAGNOSIS — N289 Disorder of kidney and ureter, unspecified: Secondary | ICD-10-CM

## 2023-04-10 NOTE — Telephone Encounter (Signed)
-----   Message from Garwin Brothers, MD sent at 04/08/2023 12:27 PM EDT ----- Not sure what the CT coronary angiography is but I would like to get the patient well-hydrated before and after CT coronary angiography.  Tell her that her kidney function is abnormal and she has higher risk of deteriorating renal function after the procedure.  Alternately she can opt for cardiac cath with less low-salt diet.  Let me know what she thinks.  We can also hydrate her and get a repeat Chem-7 before her CT coronary angiography Garwin Brothers, MD 04/08/2023 12:26 PM

## 2023-04-11 LAB — BASIC METABOLIC PANEL
BUN/Creatinine Ratio: 13 (ref 12–28)
BUN: 13 mg/dL (ref 8–27)
CO2: 24 mmol/L (ref 20–29)
Calcium: 9.3 mg/dL (ref 8.7–10.3)
Chloride: 105 mmol/L (ref 96–106)
Creatinine, Ser: 1.04 mg/dL — ABNORMAL HIGH (ref 0.57–1.00)
Glucose: 103 mg/dL — ABNORMAL HIGH (ref 70–99)
Potassium: 4.2 mmol/L (ref 3.5–5.2)
Sodium: 145 mmol/L — ABNORMAL HIGH (ref 134–144)
eGFR: 57 mL/min/{1.73_m2} — ABNORMAL LOW (ref >59)

## 2023-04-16 DIAGNOSIS — M47812 Spondylosis without myelopathy or radiculopathy, cervical region: Secondary | ICD-10-CM | POA: Diagnosis not present

## 2023-04-16 DIAGNOSIS — M542 Cervicalgia: Secondary | ICD-10-CM | POA: Diagnosis not present

## 2023-04-23 DIAGNOSIS — M542 Cervicalgia: Secondary | ICD-10-CM | POA: Diagnosis not present

## 2023-04-23 DIAGNOSIS — M47812 Spondylosis without myelopathy or radiculopathy, cervical region: Secondary | ICD-10-CM | POA: Diagnosis not present

## 2023-04-27 ENCOUNTER — Telehealth: Payer: Self-pay | Admitting: Cardiology

## 2023-04-27 NOTE — Telephone Encounter (Signed)
Pt would like a callback regarding test results and to what the next step is. Please advise

## 2023-04-27 NOTE — Telephone Encounter (Signed)
Results reviewed with pt as per Dr. Revankar's note.  Pt verbalized understanding and had no additional questions. Routed to PCP.  

## 2023-04-30 DIAGNOSIS — M542 Cervicalgia: Secondary | ICD-10-CM | POA: Diagnosis not present

## 2023-04-30 DIAGNOSIS — M47812 Spondylosis without myelopathy or radiculopathy, cervical region: Secondary | ICD-10-CM | POA: Diagnosis not present

## 2023-05-11 DIAGNOSIS — E1169 Type 2 diabetes mellitus with other specified complication: Secondary | ICD-10-CM | POA: Diagnosis not present

## 2023-05-11 DIAGNOSIS — E039 Hypothyroidism, unspecified: Secondary | ICD-10-CM | POA: Diagnosis not present

## 2023-05-11 DIAGNOSIS — Z79899 Other long term (current) drug therapy: Secondary | ICD-10-CM | POA: Diagnosis not present

## 2023-05-11 DIAGNOSIS — I7300 Raynaud's syndrome without gangrene: Secondary | ICD-10-CM | POA: Diagnosis not present

## 2023-05-11 DIAGNOSIS — E1122 Type 2 diabetes mellitus with diabetic chronic kidney disease: Secondary | ICD-10-CM | POA: Diagnosis not present

## 2023-05-11 DIAGNOSIS — I129 Hypertensive chronic kidney disease with stage 1 through stage 4 chronic kidney disease, or unspecified chronic kidney disease: Secondary | ICD-10-CM | POA: Diagnosis not present

## 2023-05-11 DIAGNOSIS — N1832 Chronic kidney disease, stage 3b: Secondary | ICD-10-CM | POA: Diagnosis not present

## 2023-05-25 DIAGNOSIS — R6889 Other general symptoms and signs: Secondary | ICD-10-CM | POA: Diagnosis not present

## 2023-05-25 DIAGNOSIS — H43811 Vitreous degeneration, right eye: Secondary | ICD-10-CM | POA: Diagnosis not present

## 2023-05-25 DIAGNOSIS — H35073 Retinal telangiectasis, bilateral: Secondary | ICD-10-CM | POA: Diagnosis not present

## 2023-05-25 DIAGNOSIS — H35372 Puckering of macula, left eye: Secondary | ICD-10-CM | POA: Diagnosis not present

## 2023-05-25 DIAGNOSIS — H401131 Primary open-angle glaucoma, bilateral, mild stage: Secondary | ICD-10-CM | POA: Diagnosis not present

## 2023-05-25 DIAGNOSIS — H43391 Other vitreous opacities, right eye: Secondary | ICD-10-CM | POA: Diagnosis not present

## 2023-05-25 DIAGNOSIS — E119 Type 2 diabetes mellitus without complications: Secondary | ICD-10-CM | POA: Diagnosis not present

## 2023-05-25 DIAGNOSIS — H35352 Cystoid macular degeneration, left eye: Secondary | ICD-10-CM | POA: Diagnosis not present

## 2023-06-01 DIAGNOSIS — M47812 Spondylosis without myelopathy or radiculopathy, cervical region: Secondary | ICD-10-CM | POA: Diagnosis not present

## 2023-06-01 DIAGNOSIS — Z6830 Body mass index (BMI) 30.0-30.9, adult: Secondary | ICD-10-CM | POA: Diagnosis not present

## 2023-06-01 DIAGNOSIS — R6889 Other general symptoms and signs: Secondary | ICD-10-CM | POA: Diagnosis not present

## 2023-07-01 DIAGNOSIS — H401131 Primary open-angle glaucoma, bilateral, mild stage: Secondary | ICD-10-CM | POA: Diagnosis not present

## 2023-09-07 DIAGNOSIS — H6123 Impacted cerumen, bilateral: Secondary | ICD-10-CM | POA: Diagnosis not present

## 2023-09-07 DIAGNOSIS — H9312 Tinnitus, left ear: Secondary | ICD-10-CM | POA: Diagnosis not present

## 2023-09-07 DIAGNOSIS — H9202 Otalgia, left ear: Secondary | ICD-10-CM | POA: Diagnosis not present

## 2023-09-07 DIAGNOSIS — R6889 Other general symptoms and signs: Secondary | ICD-10-CM | POA: Diagnosis not present

## 2023-09-07 DIAGNOSIS — H903 Sensorineural hearing loss, bilateral: Secondary | ICD-10-CM | POA: Diagnosis not present

## 2023-10-05 DIAGNOSIS — R1013 Epigastric pain: Secondary | ICD-10-CM | POA: Diagnosis not present

## 2023-10-05 DIAGNOSIS — D132 Benign neoplasm of duodenum: Secondary | ICD-10-CM | POA: Diagnosis not present

## 2023-10-15 DIAGNOSIS — R1013 Epigastric pain: Secondary | ICD-10-CM | POA: Diagnosis not present

## 2023-10-22 DIAGNOSIS — Z1211 Encounter for screening for malignant neoplasm of colon: Secondary | ICD-10-CM | POA: Diagnosis not present

## 2023-10-22 DIAGNOSIS — R1013 Epigastric pain: Secondary | ICD-10-CM | POA: Diagnosis not present

## 2023-11-07 DIAGNOSIS — K2270 Barrett's esophagus without dysplasia: Secondary | ICD-10-CM | POA: Diagnosis not present

## 2023-11-09 DIAGNOSIS — E785 Hyperlipidemia, unspecified: Secondary | ICD-10-CM | POA: Diagnosis not present

## 2023-11-09 DIAGNOSIS — E1169 Type 2 diabetes mellitus with other specified complication: Secondary | ICD-10-CM | POA: Diagnosis not present

## 2023-11-09 DIAGNOSIS — I10 Essential (primary) hypertension: Secondary | ICD-10-CM | POA: Diagnosis not present

## 2023-11-09 DIAGNOSIS — E039 Hypothyroidism, unspecified: Secondary | ICD-10-CM | POA: Diagnosis not present

## 2023-11-09 DIAGNOSIS — K760 Fatty (change of) liver, not elsewhere classified: Secondary | ICD-10-CM | POA: Diagnosis not present

## 2023-11-09 DIAGNOSIS — N1832 Chronic kidney disease, stage 3b: Secondary | ICD-10-CM | POA: Diagnosis not present

## 2023-11-11 DIAGNOSIS — J449 Chronic obstructive pulmonary disease, unspecified: Secondary | ICD-10-CM | POA: Diagnosis not present

## 2023-11-11 DIAGNOSIS — N1831 Chronic kidney disease, stage 3a: Secondary | ICD-10-CM | POA: Diagnosis not present

## 2023-11-11 DIAGNOSIS — E1122 Type 2 diabetes mellitus with diabetic chronic kidney disease: Secondary | ICD-10-CM | POA: Diagnosis not present

## 2023-11-11 DIAGNOSIS — H9319 Tinnitus, unspecified ear: Secondary | ICD-10-CM | POA: Diagnosis not present

## 2023-11-11 DIAGNOSIS — I129 Hypertensive chronic kidney disease with stage 1 through stage 4 chronic kidney disease, or unspecified chronic kidney disease: Secondary | ICD-10-CM | POA: Diagnosis not present

## 2023-11-11 DIAGNOSIS — R0602 Shortness of breath: Secondary | ICD-10-CM | POA: Diagnosis not present

## 2023-11-11 DIAGNOSIS — I10 Essential (primary) hypertension: Secondary | ICD-10-CM | POA: Diagnosis not present

## 2023-11-11 DIAGNOSIS — E782 Mixed hyperlipidemia: Secondary | ICD-10-CM | POA: Diagnosis not present

## 2023-11-11 DIAGNOSIS — T486X5A Adverse effect of antiasthmatics, initial encounter: Secondary | ICD-10-CM | POA: Diagnosis not present

## 2023-11-11 DIAGNOSIS — Z0000 Encounter for general adult medical examination without abnormal findings: Secondary | ICD-10-CM | POA: Diagnosis not present

## 2023-11-11 DIAGNOSIS — E785 Hyperlipidemia, unspecified: Secondary | ICD-10-CM | POA: Diagnosis not present

## 2023-11-11 DIAGNOSIS — R109 Unspecified abdominal pain: Secondary | ICD-10-CM | POA: Diagnosis not present

## 2023-11-11 DIAGNOSIS — R002 Palpitations: Secondary | ICD-10-CM | POA: Diagnosis not present

## 2023-11-11 DIAGNOSIS — E1169 Type 2 diabetes mellitus with other specified complication: Secondary | ICD-10-CM | POA: Diagnosis not present

## 2023-11-23 DIAGNOSIS — H35372 Puckering of macula, left eye: Secondary | ICD-10-CM | POA: Diagnosis not present

## 2023-11-23 DIAGNOSIS — H5712 Ocular pain, left eye: Secondary | ICD-10-CM | POA: Diagnosis not present

## 2023-11-23 DIAGNOSIS — H43811 Vitreous degeneration, right eye: Secondary | ICD-10-CM | POA: Diagnosis not present

## 2023-11-23 DIAGNOSIS — E119 Type 2 diabetes mellitus without complications: Secondary | ICD-10-CM | POA: Diagnosis not present

## 2023-11-23 DIAGNOSIS — H35073 Retinal telangiectasis, bilateral: Secondary | ICD-10-CM | POA: Diagnosis not present

## 2023-11-23 DIAGNOSIS — H35352 Cystoid macular degeneration, left eye: Secondary | ICD-10-CM | POA: Diagnosis not present

## 2023-11-23 DIAGNOSIS — H401131 Primary open-angle glaucoma, bilateral, mild stage: Secondary | ICD-10-CM | POA: Diagnosis not present

## 2023-11-23 DIAGNOSIS — H43391 Other vitreous opacities, right eye: Secondary | ICD-10-CM | POA: Diagnosis not present

## 2023-11-26 DIAGNOSIS — Z1231 Encounter for screening mammogram for malignant neoplasm of breast: Secondary | ICD-10-CM | POA: Diagnosis not present

## 2023-12-06 DIAGNOSIS — R110 Nausea: Secondary | ICD-10-CM | POA: Diagnosis not present

## 2023-12-06 DIAGNOSIS — R457 State of emotional shock and stress, unspecified: Secondary | ICD-10-CM | POA: Diagnosis not present

## 2023-12-06 DIAGNOSIS — M7989 Other specified soft tissue disorders: Secondary | ICD-10-CM | POA: Diagnosis not present

## 2023-12-06 DIAGNOSIS — R0602 Shortness of breath: Secondary | ICD-10-CM | POA: Diagnosis not present

## 2023-12-06 DIAGNOSIS — R109 Unspecified abdominal pain: Secondary | ICD-10-CM | POA: Diagnosis not present

## 2023-12-06 DIAGNOSIS — K5900 Constipation, unspecified: Secondary | ICD-10-CM | POA: Diagnosis not present

## 2023-12-06 DIAGNOSIS — R0789 Other chest pain: Secondary | ICD-10-CM | POA: Diagnosis not present

## 2023-12-06 DIAGNOSIS — R9431 Abnormal electrocardiogram [ECG] [EKG]: Secondary | ICD-10-CM | POA: Diagnosis not present

## 2023-12-06 DIAGNOSIS — R202 Paresthesia of skin: Secondary | ICD-10-CM | POA: Diagnosis not present

## 2023-12-06 DIAGNOSIS — R079 Chest pain, unspecified: Secondary | ICD-10-CM | POA: Diagnosis not present

## 2023-12-06 DIAGNOSIS — R42 Dizziness and giddiness: Secondary | ICD-10-CM | POA: Diagnosis not present

## 2023-12-07 DIAGNOSIS — R0789 Other chest pain: Secondary | ICD-10-CM | POA: Diagnosis not present

## 2023-12-07 DIAGNOSIS — R079 Chest pain, unspecified: Secondary | ICD-10-CM | POA: Diagnosis not present

## 2023-12-09 DIAGNOSIS — C801 Malignant (primary) neoplasm, unspecified: Secondary | ICD-10-CM

## 2023-12-09 HISTORY — DX: Malignant (primary) neoplasm, unspecified: C80.1

## 2023-12-24 ENCOUNTER — Other Ambulatory Visit: Payer: Self-pay

## 2023-12-25 LAB — SURGICAL PATHOLOGY

## 2023-12-28 ENCOUNTER — Encounter: Payer: Self-pay | Admitting: Family Medicine

## 2023-12-28 ENCOUNTER — Telehealth: Payer: Self-pay | Admitting: *Deleted

## 2023-12-28 NOTE — Telephone Encounter (Signed)
Spoke to patient to confirm upcoming afternoon [facility] clinic appointment on 1/29, paperwork will be sent via [employer].  Gave location and time, also informed patient that the surgeon's office would be calling as well to get information from them similar to the packet that they will be receiving so make sure to do both.  Reminded patient that all providers will be coming to the clinic to see them HERE and if they had any questions to not hesitate to reach back out to myself or their navigators.

## 2024-01-04 ENCOUNTER — Encounter: Payer: Self-pay | Admitting: *Deleted

## 2024-01-04 DIAGNOSIS — Z170 Estrogen receptor positive status [ER+]: Secondary | ICD-10-CM

## 2024-01-04 DIAGNOSIS — C50411 Malignant neoplasm of upper-outer quadrant of right female breast: Secondary | ICD-10-CM

## 2024-01-04 HISTORY — DX: Malignant neoplasm of upper-outer quadrant of right female breast: C50.411

## 2024-01-04 HISTORY — DX: Estrogen receptor positive status (ER+): Z17.0

## 2024-01-04 NOTE — Progress Notes (Signed)
Radiation Oncology         [phone] ________________________________  Name: Alexis Molina        MRN: [id number]  Date of Service: 01/06/2024 DOB: 14-May-1951  [id number], Resa Miner, FNP  Griselda Miner, MD     REFERRING PHYSICIAN: Chevis Pretty III, MD   DIAGNOSIS: The encounter diagnosis was Malignant neoplasm of upper-outer quadrant of right breast in female, estrogen receptor positive (HCC).   HISTORY OF PRESENT ILLNESS: Alexis Molina is a 73 y.o. female seen in the multidisciplinary breast clinic for a new diagnosis of right breast cancer. She was found on screening mammogram to have a possible mass in the right breast in the upper outer quadrant.  Further diagnostic workup confirmed a mass, and by ultrasound this measured 1 cm in greatest dimension in the 10 o'clock position.  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  Her cancer was ER/PR positive, HER2 negative with a Ki-67 of 10%.  She is seen to discuss treatment recommendations for cancer    PREVIOUS RADIATION THERAPY: {EXAM; YES/NO:19492::"No"}   PAST MEDICAL HISTORY:  Past Medical History:  Diagnosis Date   Abdominal pain 09/29/2020   Acute gastritis 07/22/2021   Allergic rhinitis    Anemia due to chronic blood loss 07/22/2021   Angina pectoris (HCC) 01/28/2017   Anosmia 12/18/2020   Anxiety    Body mass index (BMI) 30.0-30.9, adult 07/22/2021   Carpal tunnel syndrome of left wrist 06/03/2016   Cataracts, bilateral    Chest pain of uncertain etiology 07/23/2021   Chronic kidney disease, stage 3b (HCC) 01/24/2022   Chronic left shoulder pain 09/18/2016   Cystoid macular edema of left eye 05/19/2022   New onset   Diabetes mellitus without complication (HCC)    Diabetic renal disease (HCC) 01/24/2022   Dysfunctional voiding of urine 02/24/2019   Dysgeusia 12/18/2020    Enteritis 09/28/2020   Essential hypertension 07/23/2021   Family history of colonic polyps 07/22/2021   Family history of premature CAD 05/11/2015   Fatigue    Fatty liver    Gastroesophageal reflux disease 07/22/2021   Generalized abdominal pain 09/29/2020   Hemangioma of intra-abdominal structure 07/22/2021   Hiatal hernia    History of colonic polyps    History of gastritis 07/22/2021   History of vitrectomy 05/19/2022   Hyperglycemia due to type 2 diabetes mellitus (HCC) 07/22/2021   Hyperlipidemia    Hypertension    Hypertensive disorder 12/08/1988   Hypokalemia    Hypomagnesemia    Hypoproteinemia (HCC) 07/22/2021   Hypothyroidism    Idiopathic urticaria 03/31/2023   Incontinence of feces with fecal urgency 02/24/2019   Irritable bowel syndrome with diarrhea 07/22/2021   Left epiretinal membrane 05/19/2022   Medication management 01/24/2022   Mixed dyslipidemia 07/23/2021   Neck pain 12/15/2016   Obesity, unspecified    Osteopenia of lumbar spine 07/22/2021   Other allergy status, other than to drugs and biological substances 07/22/2021   Other vitreous opacities, right eye 09/11/2020   Peripheral edema    Peripheral edema    after knee surgery   Posterior vitreous detachment of both eyes 09/11/2020   Posterior vitreous detachment of right eye 09/11/2020   Prediabetes 07/22/2021   Primary open-angle glaucoma, bilateral, mild stage 01/24/2022   Problematic vaginal discharge 07/22/2021   Pruritus 03/31/2023   Raynaud's disease 07/22/2021   Raynaud's syndrome  without gangrene    Recurrent depression (HCC) 07/22/2021   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   S/P arthroscopy of left shoulder 04/07/2017   Shortness of breath 12/12/2019   Snake bite    Subacute cough 03/31/2023   Tear of left rotator cuff 12/15/2016   Trigger middle finger of left hand 01/24/2016   Tubular adenoma of colon    Type 2 diabetes mellitus with other specified complication (HCC)  05/11/2015   Urge urinary incontinence 02/24/2019   Urinary urgency 09/18/2021   Vaginal lesion 07/22/2021   Vitamin D deficiency    Vitreous floaters of right eye    Vulvitis 07/22/2021       PAST SURGICAL HISTORY: Past Surgical History:  Procedure Laterality Date   ABDOMINAL HYSTERECTOMY     BIOPSY  10/02/2020   Procedure: BIOPSY;  Surgeon: Charlott Rakes, MD;  Location: [facility];  Service: Endoscopy;;   CARDIAC CATHETERIZATION N/A 07/30/2015   Procedure: Left Heart Cath and Coronary Angiography;  Surgeon: Corky Crafts, MD;  [facility] INVASIVE CV LAB;  Service: Cardiovascular;  Laterality: N/A;   CARPAL TUNNEL RELEASE     CHOLECYSTECTOMY     ESOPHAGOGASTRODUODENOSCOPY (EGD) WITH PROPOFOL N/A 10/02/2020   Procedure: ESOPHAGOGASTRODUODENOSCOPY (EGD) WITH PROPOFOL;  Surgeon: Charlott Rakes, MD;  Location: [facility];  Service: Endoscopy;  Laterality: N/A;   KNEE SURGERY     ROTATOR CUFF REPAIR Left    TUBAL LIGATION       FAMILY HISTORY:  Family History  Problem Relation Age of Onset   Alzheimer's disease Mother    Diabetes Mother    Hypertension Mother    Ulcers Father    Hypertension Father    Arthritis Father    Heart failure Father    Lung cancer Brother      SOCIAL HISTORY:  reports that she quit smoking about 36 years ago. She has never used smokeless tobacco. She reports that she does not drink alcohol and does not use drugs. The patient is widowed and lives in [place]. She ***   ALLERGIES: Darvocet [propoxyphene n-acetaminophen], Orange fruit [citrus], Vicodin [hydrocodone-acetaminophen], Dust mite extract, Betamethasone, Celecoxib, Metformin, Morphine, Oxycodone-acetaminophen, Glimepiride, Penicillins, and Sulfa antibiotics   MEDICATIONS:  Current Outpatient Medications  Medication Sig Dispense Refill   albuterol (VENTOLIN HFA) 108 (90 Base) MCG/ACT inhaler Inhale 2 puffs into the lungs every 4 (four) hours as needed for wheezing or  shortness of breath.     diphenhydrAMINE (BENADRYL) 25 MG tablet Take 25 mg by mouth every 6 (six) hours as needed for itching.     fluticasone (FLONASE) 50 MCG/ACT nasal spray Place 1-2 sprays into both nostrils daily.     hydrOXYzine (ATARAX) 25 MG tablet Take 25 mg by mouth 3 (three) times daily.     ketorolac (ACULAR) 0.5 % ophthalmic solution INSTILL 1 DROP INTO LEFT EYE 4 TIMES DAILY 5 mL 0   latanoprost (XALATAN) 0.005 % ophthalmic solution Place 1 drop into both eyes daily.     levothyroxine (SYNTHROID) 50 MCG tablet Take 50 mcg by mouth daily.     lisinopril (ZESTRIL) 10 MG tablet Take 10 mg by mouth daily.     meloxicam (MOBIC) 7.5 MG tablet Take 7.5 mg by mouth daily.     methocarbamol (ROBAXIN) 750 MG tablet Take 750 mg by mouth at bedtime.     metoprolol tartrate (LOPRESSOR) 100 MG tablet Take 1 tablet (100 mg total) by mouth once for 1 dose. Take 2 hours prior to your  CT if your heart rate is greater than 55 1 tablet 0   montelukast (SINGULAIR) 10 MG tablet Take 10 mg by mouth daily.     Multiple Vitamin (MULTIVITAMIN WITH MINERALS) TABS tablet Take 1 tablet by mouth every other day. Gummy bears/ Unknown strength     nitroGLYCERIN (NITROSTAT) 0.4 MG SL tablet Place 0.4 mg under the tongue every 5 (five) minutes as needed for chest pain.     pantoprazole (PROTONIX) 40 MG tablet Take 1 tablet by mouth daily.     rosuvastatin (CRESTOR) 20 MG tablet Take 20 mg by mouth daily.     triamterene-hydrochlorothiazide (MAXZIDE) 75-50 MG tablet Take 1 tablet by mouth daily.     No current facility-administered medications for this visit.     REVIEW OF SYSTEMS: On review of systems, the patient reports that she is doing ***     PHYSICAL EXAM:  Wt Readings from Last 3 Encounters:  04/01/23 167 lb 9.6 oz (76 kg)  10/22/22 154 lb 6.4 oz (70 kg)  01/28/22 144 lb 12.8 oz (65.7 kg)   Temp Readings from Last 3 Encounters:  10/03/20 98.7 F (37.1 C) (Oral)  01/22/20 97.9 F (36.6 C)  (Oral)  12/30/19 98.3 F (36.8 C) (Oral)   BP Readings from Last 3 Encounters:  04/07/23 117/65  04/01/23 110/64  10/22/22 120/60   Pulse Readings from Last 3 Encounters:  04/01/23 71  10/22/22 60  01/28/22 62    In general this is a well appearing *** female in no acute distress. She's alert and oriented x4 and appropriate throughout the examination. Cardiopulmonary assessment is negative for acute distress and she exhibits normal effort. Bilateral breast exam is deferred.    ECOG = ***  0 - Asymptomatic (Fully active, able to carry on all predisease activities without restriction)  1 - Symptomatic but completely ambulatory (Restricted in physically strenuous activity but ambulatory and able to carry out work of a light or sedentary nature. For example, light housework, office work)  2 - Symptomatic, <50% in bed during the day (Ambulatory and capable of all self care but unable to carry out any work activities. Up and about more than 50% of waking hours)  3 - Symptomatic, >50% in bed, but not bedbound (Capable of only limited self-care, confined to bed or chair 50% or more of waking hours)  4 - Bedbound (Completely disabled. Cannot carry on any self-care. Totally confined to bed or chair)  5 - Death   Santiago Glad MM, Creech RH, Tormey DC, et al. [phone]). "Toxicity and response criteria of the [facility]". Am. Evlyn Clines. Oncol. 5 (6): 649-55    LABORATORY DATA:  Lab Results  Component Value Date   WBC 3.6 (L) 10/03/2020   HGB 10.0 (L) 10/03/2020   HCT 29.7 (L) 10/03/2020   MCV 81.4 10/03/2020   PLT 177 10/03/2020   Lab Results  Component Value Date   NA 145 (H) 04/10/2023   K 4.2 04/10/2023   CL 105 04/10/2023   CO2 24 04/10/2023   Lab Results  Component Value Date   ALT 25 10/03/2020   AST 35 10/03/2020   ALKPHOS 40 10/03/2020   BILITOT 0.6 10/03/2020      RADIOGRAPHY: No results found.     IMPRESSION/PLAN: 1. Stage IA, [id number], grade  1, ER/PR positive invasive ductal carcinoma of the right breast. Dr. Mitzi Hansen discusses the pathology findings and reviews the nature of *** breast disease. The consensus from the breast  conference includes breast conservation with lumpectomy with *** sentinel node biopsy. Depending on the size of the final tumor measurements rendered by pathology, the tumor may be tested for Oncotype Dx score to determine a role for systemic therapy. Provided that chemotherapy is not indicated, the patient's course would then be followed by external radiotherapy to the breast  to reduce risks of local recurrence. Dr. Marland Kitchen anticipates adjuvant antiestrogen therapy to follow. We discussed the risks, benefits, short, and long term effects of radiotherapy, as well as the curative intent, and the patient is interested in proceeding. Dr. Mitzi Hansen discusses the delivery and logistics of radiotherapy and anticipates a course of *** weeks of radiotherapy. We will see her back a few weeks after surgery to discuss the simulation process and anticipate we starting radiotherapy about 4-6 weeks after surgery.   2. Possible genetic predisposition to malignancy. The patient is a candidate for genetic testing given *** personal and family history. She will meet with our geneticist today in clinic.   In a visit lasting *** minutes, greater than 50% of the time was spent face to face reviewing her case, as well as in preparation of, discussing, and coordinating the patient's care.  The above documentation reflects my direct findings during this shared patient visit. Please see the separate note by Dr. Mitzi Hansen on this date for the remainder of the patient's plan of care.    Osker Mason, [facility]    **Disclaimer: This note was dictated with voice recognition software. Similar sounding words can inadvertently be transcribed and this note may contain transcription errors which may not have been corrected upon publication of note.**

## 2024-01-06 ENCOUNTER — Ambulatory Visit: Payer: Self-pay | Admitting: General Surgery

## 2024-01-06 ENCOUNTER — Inpatient Hospital Stay (HOSPITAL_BASED_OUTPATIENT_CLINIC_OR_DEPARTMENT_OTHER): Payer: Medicare HMO | Admitting: Genetic Counselor

## 2024-01-06 ENCOUNTER — Inpatient Hospital Stay: Payer: Medicare HMO | Attending: Hematology and Oncology | Admitting: Hematology and Oncology

## 2024-01-06 ENCOUNTER — Ambulatory Visit
Admission: RE | Admit: 2024-01-06 | Discharge: 2024-01-06 | Disposition: A | Discharge status: Home / Self Care | Payer: Medicare HMO | Source: Ambulatory Visit | Attending: Radiation Oncology | Admitting: Radiation Oncology

## 2024-01-06 ENCOUNTER — Encounter: Payer: Self-pay | Admitting: General Practice

## 2024-01-06 ENCOUNTER — Ambulatory Visit: Payer: Medicare HMO | Admitting: Physical Therapy

## 2024-01-06 ENCOUNTER — Encounter: Payer: Self-pay | Admitting: *Deleted

## 2024-01-06 ENCOUNTER — Inpatient Hospital Stay: Payer: Medicare HMO

## 2024-01-06 VITALS — BP 116/50 | HR 79 | Temp 97.7°F | Resp 18 | Ht 62.0 in | Wt 159.0 lb

## 2024-01-06 DIAGNOSIS — Z170 Estrogen receptor positive status [ER+]: Secondary | ICD-10-CM

## 2024-01-06 DIAGNOSIS — Z87891 Personal history of nicotine dependence: Secondary | ICD-10-CM | POA: Insufficient documentation

## 2024-01-06 DIAGNOSIS — C50411 Malignant neoplasm of upper-outer quadrant of right female breast: Secondary | ICD-10-CM

## 2024-01-06 DIAGNOSIS — Z1721 Progesterone receptor positive status: Secondary | ICD-10-CM | POA: Insufficient documentation

## 2024-01-06 DIAGNOSIS — Z1732 Human epidermal growth factor receptor 2 negative status: Secondary | ICD-10-CM | POA: Insufficient documentation

## 2024-01-06 DIAGNOSIS — Z8041 Family history of malignant neoplasm of ovary: Secondary | ICD-10-CM

## 2024-01-06 DIAGNOSIS — Z8042 Family history of malignant neoplasm of prostate: Secondary | ICD-10-CM

## 2024-01-06 LAB — CBC WITH DIFFERENTIAL (CANCER CENTER ONLY)
Abs Immature Granulocytes: 0.01 10*3/uL (ref 0.00–0.07)
Basophils Absolute: 0 10*3/uL (ref 0.0–0.1)
Basophils Relative: 0 %
Eosinophils Absolute: 0.1 10*3/uL (ref 0.0–0.5)
Eosinophils Relative: 1 %
HCT: 38.2 % (ref 36.0–46.0)
Hemoglobin: 13.1 g/dL (ref 12.0–15.0)
Immature Granulocytes: 0 %
Lymphocytes Relative: 13 %
Lymphs Abs: 0.6 10*3/uL — ABNORMAL LOW (ref 0.7–4.0)
MCH: 27.3 pg (ref 26.0–34.0)
MCHC: 34.3 g/dL (ref 30.0–36.0)
MCV: 79.7 fL — ABNORMAL LOW (ref 80.0–100.0)
Monocytes Absolute: 0.3 10*3/uL (ref 0.1–1.0)
Monocytes Relative: 7 %
Neutro Abs: 4 10*3/uL (ref 1.7–7.7)
Neutrophils Relative %: 79 %
Platelet Count: 257 10*3/uL (ref 150–400)
RBC: 4.79 MIL/uL (ref 3.87–5.11)
RDW: 13.5 % (ref 11.5–15.5)
WBC Count: 5.1 10*3/uL (ref 4.0–10.5)
nRBC: 0 % (ref 0.0–0.2)

## 2024-01-06 LAB — CMP (CANCER CENTER ONLY)
ALT: 18 U/L (ref 0–44)
AST: 24 U/L (ref 15–41)
Albumin: 4.5 g/dL (ref 3.5–5.0)
Alkaline Phosphatase: 53 U/L (ref 38–126)
Anion gap: 9 (ref 5–15)
BUN: 8 mg/dL (ref 8–23)
CO2: 29 mmol/L (ref 22–32)
Calcium: 9.9 mg/dL (ref 8.9–10.3)
Chloride: 97 mmol/L — ABNORMAL LOW (ref 98–111)
Creatinine: 0.99 mg/dL (ref 0.44–1.00)
GFR, Estimated: >60 mL/min (ref >60)
Glucose, Bld: 107 mg/dL — ABNORMAL HIGH (ref 70–99)
Potassium: 3.3 mmol/L — ABNORMAL LOW (ref 3.5–5.1)
Sodium: 135 mmol/L (ref 135–145)
Total Bilirubin: 1.1 mg/dL (ref 0.0–1.2)
Total Protein: 7.6 g/dL (ref 6.5–8.1)

## 2024-01-06 LAB — GENETIC SCREENING ORDER

## 2024-01-06 NOTE — Progress Notes (Signed)
[facility] CONSULT NOTE  Patient Care Team: Soundra Pilon, FNP as PCP - General (Family Medicine) Corky Crafts, MD as PCP - Cardiology (Cardiology) Pershing Proud, RN as Oncology Nurse Navigator Donnelly Angelica, RN as Oncology Nurse Navigator Serena Croissant, MD as Consulting Physician (Hematology and Oncology)  CHIEF COMPLAINTS/PURPOSE OF CONSULTATION:  Newly diagnosed breast cancer  HISTORY OF PRESENTING ILLNESS:  [name] is a 73 year old who had a screening mammogram that detected right upper outer quadrant mass measuring 1.2 cm.  Axilla was negative.  Biopsy revealed grade 1 IDC there is ER/PR positive HER2 negative.  She was presented this morning to the multidisciplinary tumor board and she is here today accompanied by her husband to discuss her treatment plan.  I reviewed her records extensively and collaborated the history with the patient.  SUMMARY OF ONCOLOGIC HISTORY: Oncology History  Malignant neoplasm of upper-outer quadrant of right breast in female, estrogen receptor positive (HCC)  12/24/2023 Initial Diagnosis   Screening mammogram detected right upper outer quadrant mass 1 cm by ultrasound at 10 o'clock position.  Axilla negative.  Biopsy: Grade 1 IDC ER 100%, PR 100%, Ki67 10%, HER2 negative by [facility]   01/04/2024 Cancer Staging   Staging form: Breast, AJCC 8th Edition - Clinical stage from 01/04/2024: Stage IA (cT1b, cN0, cM0, G1, ER+, PR+, HER2-) - Signed by Ronny Bacon, PA-C on 01/04/2024 Stage prefix: Initial diagnosis Method of lymph node assessment: Clinical Histologic grading system: 3 grade system      MEDICAL HISTORY:  Past Medical History:  Diagnosis Date   Abdominal pain 09/29/2020   Acute gastritis 07/22/2021   Allergic rhinitis    Anemia due to chronic blood loss 07/22/2021   Angina pectoris (HCC) 01/28/2017   Anosmia 12/18/2020   Anxiety    Body mass index (BMI) 30.0-30.9, adult 07/22/2021   Carpal tunnel  syndrome of left wrist 06/03/2016   Cataracts, bilateral    Chest pain of uncertain etiology 07/23/2021   Chronic kidney disease, stage 3b (HCC) 01/24/2022   Chronic left shoulder pain 09/18/2016   Cystoid macular edema of left eye 05/19/2022   New onset   Diabetes mellitus without complication (HCC)    Diabetic renal disease (HCC) 01/24/2022   Dysfunctional voiding of urine 02/24/2019   Dysgeusia 12/18/2020   Enteritis 09/28/2020   Essential hypertension 07/23/2021   Family history of colonic polyps 07/22/2021   Family history of premature CAD 05/11/2015   Fatigue    Fatty liver    Gastroesophageal reflux disease 07/22/2021   Generalized abdominal pain 09/29/2020   Hemangioma of intra-abdominal structure 07/22/2021   Hiatal hernia    History of colonic polyps    History of gastritis 07/22/2021   History of vitrectomy 05/19/2022   Hyperglycemia due to type 2 diabetes mellitus (HCC) 07/22/2021   Hyperlipidemia    Hypertension    Hypertensive disorder 12/08/1988   Hypokalemia    Hypomagnesemia    Hypoproteinemia (HCC) 07/22/2021   Hypothyroidism    Idiopathic urticaria 03/31/2023   Incontinence of feces with fecal urgency 02/24/2019   Irritable bowel syndrome with diarrhea 07/22/2021   Left epiretinal membrane 05/19/2022   Medication management 01/24/2022   Mixed dyslipidemia 07/23/2021   Neck pain 12/15/2016   Obesity, unspecified    Osteopenia of lumbar spine 07/22/2021   Other allergy status, other than to drugs and biological substances 07/22/2021   Other vitreous opacities, right eye 09/11/2020   Peripheral edema    Peripheral edema  after knee surgery   Posterior vitreous detachment of both eyes 09/11/2020   Posterior vitreous detachment of right eye 09/11/2020   Prediabetes 07/22/2021   Primary open-angle glaucoma, bilateral, mild stage 01/24/2022   Problematic vaginal discharge 07/22/2021   Pruritus 03/31/2023   Raynaud's disease 07/22/2021   Raynaud's  syndrome without gangrene    Recurrent depression (HCC) 07/22/2021   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   S/P arthroscopy of left shoulder 04/07/2017   Shortness of breath 12/12/2019   Snake bite    Subacute cough 03/31/2023   Tear of left rotator cuff 12/15/2016   Trigger middle finger of left hand 01/24/2016   Tubular adenoma of colon    Type 2 diabetes mellitus with other specified complication (HCC) 05/11/2015   Urge urinary incontinence 02/24/2019   Urinary urgency 09/18/2021   Vaginal lesion 07/22/2021   Vitamin D deficiency    Vitreous floaters of right eye    Vulvitis 07/22/2021    SURGICAL HISTORY: Past Surgical History:  Procedure Laterality Date   ABDOMINAL HYSTERECTOMY     BIOPSY  10/02/2020   Procedure: BIOPSY;  Surgeon: Charlott Rakes, MD;  Location: [facility];  Service: Endoscopy;;   CARDIAC CATHETERIZATION N/A 07/30/2015   Procedure: Left Heart Cath and Coronary Angiography;  Surgeon: Corky Crafts, MD;  Location: [facility] INVASIVE CV LAB;  Service: Cardiovascular;  Laterality: N/A;   CARPAL TUNNEL RELEASE     CHOLECYSTECTOMY     ESOPHAGOGASTRODUODENOSCOPY (EGD) WITH PROPOFOL N/A 10/02/2020   Procedure: ESOPHAGOGASTRODUODENOSCOPY (EGD) WITH PROPOFOL;  Surgeon: Charlott Rakes, MD;  Location: [facility];  Service: Endoscopy;  Laterality: N/A;   KNEE SURGERY     ROTATOR CUFF REPAIR Left    TUBAL LIGATION      SOCIAL HISTORY: Social History   Socioeconomic History   Marital status: Widowed    Spouse name: Not on file   Number of children: Not on file   Years of education: Not on file   Highest education level: Not on file  Occupational History   Not on file  Tobacco Use   Smoking status: Former    Current packs/day: 0.00    Types: Cigarettes    Quit date: 12/09/1987    Years since quitting: 36.1   Smokeless tobacco: Never  Vaping Use   Vaping status: Never Used  Substance and Sexual Activity   Alcohol use: No   Drug use: No    Sexual activity: Not on file  Other Topics Concern   Not on file  Social History Narrative   Not on file   Social Drivers of Health   Financial Resource Strain: Not on file  Food Insecurity: No Food Insecurity (01/06/2024)   Hunger Vital Sign    Worried About Running Out of Food in the Last Year: Never true    Ran Out of Food in the Last Year: Never true  Transportation Needs: No Transportation Needs (11/04/2023)   Received from [employer]    In the past 12 months, has lack of reliable transportation kept you from medical appointments, meetings, work or from getting things needed for daily living? : No  Physical Activity: Not on file  Stress: Not on file  Social Connections: Not on file  Intimate Partner Violence: Not At Risk (01/06/2024)   Humiliation, Afraid, Rape, and Kick questionnaire    Fear of Current or Ex-Partner: No    Emotionally Abused: No    Physically Abused: No    Sexually Abused:  No    FAMILY HISTORY: Family History  Problem Relation Age of Onset   Alzheimer's disease Mother    Diabetes Mother    Hypertension Mother    Ulcers Father    Hypertension Father    Arthritis Father    Heart failure Father    Lung cancer Brother     ALLERGIES:  is allergic to darvocet [propoxyphene n-acetaminophen], orange fruit [citrus], vicodin [hydrocodone-acetaminophen], dust mite extract, betamethasone, celecoxib, metformin, morphine, oxycodone-acetaminophen, glimepiride, penicillins, and sulfa antibiotics.  MEDICATIONS:  Current Outpatient Medications  Medication Sig Dispense Refill   dicyclomine (BENTYL) 20 MG tablet Take 20 mg by mouth 3 (three) times daily.     ketorolac (ACULAR) 0.5 % ophthalmic solution INSTILL 1 DROP INTO LEFT EYE 4 TIMES DAILY 5 mL 0   levothyroxine (SYNTHROID) 50 MCG tablet Take 50 mcg by mouth daily.     lisinopril (ZESTRIL) 10 MG tablet Take 10 mg by mouth daily.     Multiple Vitamin (MULTIVITAMIN WITH MINERALS) TABS tablet  Take 1 tablet by mouth every other day. Gummy bears/ Unknown strength     pantoprazole (PROTONIX) 40 MG tablet Take 1 tablet by mouth daily.     rosuvastatin (CRESTOR) 20 MG tablet Take 20 mg by mouth daily.     sertraline (ZOLOFT) 25 MG tablet Take 1 tablet by mouth daily.     triamterene-hydrochlorothiazide (MAXZIDE) 75-50 MG tablet Take 1 tablet by mouth daily.     acetaminophen (TYLENOL) 500 MG tablet      albuterol (VENTOLIN HFA) 108 (90 Base) MCG/ACT inhaler Inhale 2 puffs into the lungs every 4 (four) hours as needed for wheezing or shortness of breath. (Patient not taking: Reported on 01/06/2024)     diphenhydrAMINE (BENADRYL) 25 MG tablet Take 25 mg by mouth every 6 (six) hours as needed for itching. (Patient not taking: Reported on 01/06/2024)     fluticasone (FLONASE) 50 MCG/ACT nasal spray Place 1-2 sprays into both nostrils daily. (Patient not taking: Reported on 01/06/2024)     hydrOXYzine (ATARAX) 25 MG tablet Take 25 mg by mouth 3 (three) times daily. (Patient not taking: Reported on 01/06/2024)     latanoprost (XALATAN) 0.005 % ophthalmic solution Place 1 drop into both eyes daily. (Patient not taking: Reported on 01/06/2024)     meloxicam (MOBIC) 7.5 MG tablet Take 7.5 mg by mouth daily. (Patient not taking: Reported on 01/06/2024)     methocarbamol (ROBAXIN) 750 MG tablet Take 750 mg by mouth at bedtime. (Patient not taking: Reported on 01/06/2024)     metoprolol tartrate (LOPRESSOR) 100 MG tablet Take 1 tablet (100 mg total) by mouth once for 1 dose. Take 2 hours prior to your CT if your heart rate is greater than 55 1 tablet 0   montelukast (SINGULAIR) 10 MG tablet Take 10 mg by mouth daily. (Patient not taking: Reported on 01/06/2024)     nitroGLYCERIN (NITROSTAT) 0.4 MG SL tablet Place 0.4 mg under the tongue every 5 (five) minutes as needed for chest pain. (Patient not taking: Reported on 01/06/2024)     No current facility-administered medications for this visit.    REVIEW OF  SYSTEMS:   Constitutional: Denies fevers, chills or abnormal night sweats Breast:  Denies any palpable lumps or discharge All other systems were reviewed with the patient and are negative.  PHYSICAL EXAMINATION: ECOG PERFORMANCE STATUS: 0 - Asymptomatic  Vitals:   01/06/24 1305  BP: (!) 116/50  Pulse: 79  Resp: 18  Temp: 97.7 F (  36.5 C)  SpO2: 96%   Filed Weights   01/06/24 1305  Weight: 159 lb (72.1 kg)    GENERAL:alert, no distress and comfortable   LABORATORY DATA:  I have reviewed the data as listed Lab Results  Component Value Date   WBC 5.1 01/06/2024   HGB 13.1 01/06/2024   HCT 38.2 01/06/2024   MCV 79.7 (L) 01/06/2024   PLT 257 01/06/2024   Lab Results  Component Value Date   NA 135 01/06/2024   K 3.3 (L) 01/06/2024   CL 97 (L) 01/06/2024   CO2 29 01/06/2024    RADIOGRAPHIC STUDIES: I have personally reviewed the radiological reports and agreed with the findings in the report.  ASSESSMENT AND PLAN:  Malignant neoplasm of upper-outer quadrant of right breast in female, estrogen receptor positive (HCC) 12/24/2023:Screening mammogram detected right upper outer quadrant mass 1 cm by ultrasound at 10 o'clock position.  Axilla negative.  Biopsy: Grade 1 IDC ER 100%, PR 100%, Ki67 10%, HER2 negative by [facility]  Pathology and radiology counseling:Discussed with the patient, the details of pathology including the type of breast cancer,the clinical staging, the significance of ER, PR and HER-2/neu receptors and the implications for treatment. After reviewing the pathology in detail, we proceeded to discuss the different treatment options between surgery, radiation, chemotherapy, antiestrogen therapies.  Recommendations: 1. Breast conserving surgery followed by 2. Oncotype DX testing to determine if chemotherapy would be of any benefit followed by 3. Adjuvant radiation therapy followed by 4. Adjuvant antiestrogen therapy  Oncotype counseling: I discussed Oncotype DX  test. I explained to the patient that this is a 21 gene panel to evaluate patient tumors DNA to calculate recurrence score. This would help determine whether patient has high risk or low risk breast cancer. She understands that if her tumor was found to be high risk, she would benefit from systemic chemotherapy. If low risk, no need of chemotherapy.  Return to clinic after surgery to discuss final pathology report and then determine if Oncotype DX testing will need to be sent.    All questions were answered. The patient knows to call the clinic with any problems, questions or concerns.    Tamsen Meek, MD 01/06/24

## 2024-01-06 NOTE — Progress Notes (Signed)
[facility] Multidisciplinary Clinic Spiritual Care Note  Met with Alexis Molina and her son and daughter in Breast Multidisciplinary Clinic to introduce Support Center team/resources.  she completed SDOH screening; results follow below.    SDOH Screenings   Food Insecurity: No Food Insecurity (01/06/2024)  Housing: Low Risk  (01/06/2024)  Transportation Needs: No Transportation Needs (11/04/2023)   Received from [facility]  Utilities: Low Risk  (11/04/2023)   Received from [facility]  Depression (PHQ2-9): Low Risk  (01/06/2024)  Tobacco Use: Medium Risk (12/18/2023)   Received from [facility] and patient discussed common feelings and emotions when being diagnosed with cancer, and the importance of support during treatment.  Chaplain informed patient of the support team and support services at [facility].  Chaplain provided contact information and encouraged patient to call with any questions or concerns.  Alexis Molina reports good support, especially from family, including a cousin who has had a mastectomy.   Follow up needed: Yes.  Alexis Molina welcomes a follow-up call in ca 2 weeks.   [address] [place], [place], [facility] Pager [phone] Voicemail [phone]

## 2024-01-06 NOTE — Assessment & Plan Note (Signed)
12/24/2023:Screening mammogram detected right upper outer quadrant mass 1 cm by ultrasound at 10 o'clock position.  Axilla negative.  Biopsy: Grade 1 IDC ER 100%, PR 100%, Ki67 10%, HER2 negative by [facility]  Pathology and radiology counseling:Discussed with the patient, the details of pathology including the type of breast cancer,the clinical staging, the significance of ER, PR and HER-2/neu receptors and the implications for treatment. After reviewing the pathology in detail, we proceeded to discuss the different treatment options between surgery, radiation, chemotherapy, antiestrogen therapies.  Recommendations: 1. Breast conserving surgery followed by 2. Oncotype DX testing to determine if chemotherapy would be of any benefit followed by 3. Adjuvant radiation therapy followed by 4. Adjuvant antiestrogen therapy  Oncotype counseling: I discussed Oncotype DX test. I explained to the patient that this is a 21 gene panel to evaluate patient tumors DNA to calculate recurrence score. This would help determine whether patient has high risk or low risk breast cancer. She understands that if her tumor was found to be high risk, she would benefit from systemic chemotherapy. If low risk, no need of chemotherapy.  Return to clinic after surgery to discuss final pathology report and then determine if Oncotype DX testing will need to be sent.

## 2024-01-06 NOTE — Research (Signed)
Trial:  [employer] 2021-05 - Specimen Collection Study to Evaluate Biomarkers in Subjects with Cancer  Patient Alexis Molina was identified by Dr Pamelia Hoit as a potential candidate for the above listed study.  This Clinical Research Nurse met with YATZARI JONSSON, [id number], on 01/06/24 in a manner and location that ensures patient privacy to discuss participation in the above listed research study.  Patient is Accompanied by her son and daughter .  A copy of the informed consent document with embedded HIPAA language was provided to the patient.  Patient reads, speaks, and understands [place].   Patient was provided with the business card of this Nurse and encouraged to contact the research team with any questions.  Approximately 15 minutes were spent with the patient reviewing the informed consent documents.  Patient was provided the option of taking informed consent documents home to review and was encouraged to review at their convenience with their support network, including other care providers. Patient took the consent documents home to review. Plan follow up by phone next week. Confirmed phone number [phone].  Margret Chance Belem Hintze, RN, BSN, [facility]  Her  Hers Clinical Research Nurse [facility] Direct Dial [phone] 01/06/2024 4:03 PM

## 2024-01-07 ENCOUNTER — Telehealth: Payer: Self-pay | Admitting: Hematology and Oncology

## 2024-01-07 ENCOUNTER — Encounter: Payer: Self-pay | Admitting: Genetic Counselor

## 2024-01-07 NOTE — Progress Notes (Signed)
REFERRING PROVIDER: Serena Croissant, MD [address] [place],  [place] [address]  PRIMARY PROVIDER:  Soundra Pilon, FNP  PRIMARY REASON FOR VISIT:  1. Malignant neoplasm of upper-outer quadrant of right breast in female, estrogen receptor positive (HCC)   2. Family history of prostate cancer   3. Family history of ovarian cancer     HISTORY OF PRESENT ILLNESS:   Alexis Molina, a 73 y.o. female, was seen for a [facility] cancer genetics consultation at the request of Dr. Pamelia Hoit due to a personal history of breast cancer.  Alexis Molina presents to clinic today to discuss the possibility of a hereditary predisposition to cancer, to discuss genetic testing, and to further clarify her future cancer risks, as well as potential cancer risks for family members.   In January 2025, at the age of 18, Alexis Molina was diagnosed with invasive ductal carcinoma of the right breast (ER+/PR+/HER2-). The preliminary treatment plan includes breast conserving surgery, Oncotype DX to determine potential benefit from chemotherapy, adjuvant radiation, and adjuvant anti-estrogens.   CANCER HISTORY:  Oncology History  Malignant neoplasm of upper-outer quadrant of right breast in female, estrogen receptor positive (HCC)  12/24/2023 Initial Diagnosis   Screening mammogram detected right upper outer quadrant mass 1 cm by ultrasound at 10 o'clock position.  Axilla negative.  Biopsy: Grade 1 IDC ER 100%, PR 100%, Ki67 10%, HER2 negative by [facility]   01/04/2024 Cancer Staging   Staging form: Breast, AJCC 8th Edition - Clinical stage from 01/04/2024: Stage IA (cT1b, cN0, cM0, G1, ER+, PR+, HER2-) - Signed by Ronny Bacon, PA-C on 01/04/2024 Stage prefix: Initial diagnosis Method of lymph node assessment: Clinical Histologic grading system: 3 grade system      Past Medical History:  Diagnosis Date   Abdominal pain 09/29/2020   Acute gastritis 07/22/2021   Allergic rhinitis    Anemia due to chronic  blood loss 07/22/2021   Angina pectoris (HCC) 01/28/2017   Anosmia 12/18/2020   Anxiety    Body mass index (BMI) 30.0-30.9, adult 07/22/2021   Carpal tunnel syndrome of left wrist 06/03/2016   Cataracts, bilateral    Chest pain of uncertain etiology 07/23/2021   Chronic kidney disease, stage 3b (HCC) 01/24/2022   Chronic left shoulder pain 09/18/2016   Cystoid macular edema of left eye 05/19/2022   New onset   Diabetes mellitus without complication (HCC)    Diabetic renal disease (HCC) 01/24/2022   Dysfunctional voiding of urine 02/24/2019   Dysgeusia 12/18/2020   Enteritis 09/28/2020   Essential hypertension 07/23/2021   Family history of colonic polyps 07/22/2021   Family history of premature CAD 05/11/2015   Fatigue    Fatty liver    Gastroesophageal reflux disease 07/22/2021   Generalized abdominal pain 09/29/2020   Hemangioma of intra-abdominal structure 07/22/2021   Hiatal hernia    History of colonic polyps    History of gastritis 07/22/2021   History of vitrectomy 05/19/2022   Hyperglycemia due to type 2 diabetes mellitus (HCC) 07/22/2021   Hyperlipidemia    Hypertension    Hypertensive disorder 12/08/1988   Hypokalemia    Hypomagnesemia    Hypoproteinemia (HCC) 07/22/2021   Hypothyroidism    Idiopathic urticaria 03/31/2023   Incontinence of feces with fecal urgency 02/24/2019   Irritable bowel syndrome with diarrhea 07/22/2021   Left epiretinal membrane 05/19/2022   Medication management 01/24/2022   Mixed dyslipidemia 07/23/2021   Neck pain 12/15/2016   Obesity, unspecified    Osteopenia of lumbar  spine 07/22/2021   Other allergy status, other than to drugs and biological substances 07/22/2021   Other vitreous opacities, right eye 09/11/2020   Peripheral edema    Peripheral edema    after knee surgery   Posterior vitreous detachment of both eyes 09/11/2020   Posterior vitreous detachment of right eye 09/11/2020   Prediabetes 07/22/2021   Primary  open-angle glaucoma, bilateral, mild stage 01/24/2022   Problematic vaginal discharge 07/22/2021   Pruritus 03/31/2023   Raynaud's disease 07/22/2021   Raynaud's syndrome without gangrene    Recurrent depression (HCC) 07/22/2021   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   S/P arthroscopy of left shoulder 04/07/2017   Shortness of breath 12/12/2019   Snake bite    Subacute cough 03/31/2023   Tear of left rotator cuff 12/15/2016   Trigger middle finger of left hand 01/24/2016   Tubular adenoma of colon    Type 2 diabetes mellitus with other specified complication (HCC) 05/11/2015   Urge urinary incontinence 02/24/2019   Urinary urgency 09/18/2021   Vaginal lesion 07/22/2021   Vitamin D deficiency    Vitreous floaters of right eye    Vulvitis 07/22/2021    Past Surgical History:  Procedure Laterality Date   ABDOMINAL HYSTERECTOMY     BIOPSY  10/02/2020   Procedure: BIOPSY;  Surgeon: Charlott Rakes, MD;  Location: [facility] ENDOSCOPY;  Service: Endoscopy;;   CARDIAC CATHETERIZATION N/A 07/30/2015   Procedure: Left Heart Cath and Coronary Angiography;  Surgeon: Corky Crafts, MD;  Location: [facility] INVASIVE CV LAB;  Service: Cardiovascular;  Laterality: N/A;   CARPAL TUNNEL RELEASE     CHOLECYSTECTOMY     ESOPHAGOGASTRODUODENOSCOPY (EGD) WITH PROPOFOL N/A 10/02/2020   Procedure: ESOPHAGOGASTRODUODENOSCOPY (EGD) WITH PROPOFOL;  Surgeon: Charlott Rakes, MD;  Location: [facility] ENDOSCOPY;  Service: Endoscopy;  Laterality: N/A;   KNEE SURGERY     ROTATOR CUFF REPAIR Left    TUBAL LIGATION      FAMILY HISTORY:  We obtained a detailed, 4-generation family history.  Significant diagnoses are listed below: Family History  Problem Relation Age of Onset   Uterine cancer Sister 4       d. 4   Lung cancer Brother 18       d. 1   Prostate cancer Paternal Uncle        d. 16; mets   Ovarian cancer Daughter        dx 76s    Alexis Molina is unaware of previous family history of genetic  testing for hereditary cancer risks. There is no reported Ashkenazi Jewish ancestry. There is no known consanguinity.  GENETIC COUNSELING ASSESSMENT: Alexis Molina is a 73 y.o. female with a history of cancer which is somewhat suggestive of a hereditary cancer syndrome and predisposition to cancer given the presence of related cancers in the family (breast, metastatic prostate, and ovarian). We, therefore, discussed and recommended the following at today's visit.   DISCUSSION: We discussed that 5 - 10% of cancer is hereditary.  Most cases of hereditary breast cancer are associated with mutations in BRCA1/2.  There are other genes that can be associated with hereditary breast, prostate, ovarian, or other cancer syndromes.  We discussed that testing is beneficial for several reasons including knowing how to follow individuals for their cancer risks, identifying whether potential treatment options would be beneficial, and understanding if other family members could be at risk for cancer and allowing them to undergo genetic testing.   We reviewed the characteristics, features and inheritance  patterns of hereditary cancer syndromes. We also discussed genetic testing, including the appropriate family members to test, the process of testing, insurance coverage and turn-around-time for results. We discussed the implications of a negative, positive, carrier and/or variant of uncertain significant result. We recommended Alexis Molina pursue genetic testing for a panel that includes genes associated with breast, ovarian, prostate, and other cancers, especially given that she had limited information about the pathology of her sister's childhood cancer.   The CancerNext-Expanded gene panel offered by [facility] and includes sequencing, rearrangement, and RNA analysis for the following 76 genes: AIP, ALK, APC, ATM, AXIN2, BAP1, BARD1, BMPR1A, BRCA1, BRCA2, BRIP1, CDC73, CDH1, CDK4, CDKN1B, CDKN2A, CEBPA, CHEK2, CTNNA1,  DDX41, DICER1, ETV6, FH, FLCN, GATA2, LZTR1, MAX, MBD4, MEN1, MET, MLH1, MSH2, MSH3, MSH6, MUTYH, NF1, NF2, NTHL1, PALB2, PHOX2B, PMS2, POT1, PRKAR1A, PTCH1, PTEN, RAD51C, RAD51D, RB1, RET, RUNX1, SDHA, SDHAF2, SDHB, SDHC, SDHD, SMAD4, SMARCA4, SMARCB1, SMARCE1, STK11, SUFU, TMEM127, TP53, TSC1, TSC2, VHL, and WT1 (sequencing and deletion/duplication); EGFR, HOXB13, KIT, MITF, PDGFRA, POLD1, and POLE (sequencing only); EPCAM and GREM1 (deletion/duplication only).   Based on Alexis Molina personal history of breast cancer with close relatives with ovarian and metastatic prostate cancer, she meets NCCN criteria for genetic testing. Despite that she meets criteria, she may still have an out of pocket cost. We discussed that if her out of pocket cost for testing is over $100, the laboratory should contact her and discuss the self-pay prices and/or patient pay assistance programs.    PLAN: After considering the risks, benefits, and limitations, Alexis Molina provided informed consent to pursue genetic testing and the blood sample was sent to [facility] for analysis of the CancerNext-Expanded +RNAinsight Panel. Results should be available within approximately 2-3 weeks, at which point they will be disclosed by telephone to Alexis Molina, as will any additional recommendations warranted by these results. Alexis Molina will receive a summary of her genetic counseling visit and a copy of her results once available. This information will also be available in Epic.   Based on Alexis Molina family history, we recommended her daughter, who was reportedly diagnosed with ovarian cancer, have genetic counseling and testing. Alexis Molina can let us know if we can be of any assistance in coordinating genetic counseling and/or testing for this family member.   Alexis Molina questions were answered to her satisfaction today. Our contact information was provided should additional questions or concerns arise. Thank you for the referral  and allowing [place] to share in the care of your patient.   Vinaya Sancho M. Rennie Plowman, MS, [facility] Genetic Counselor [email or web address] (P) [phone]   30 minutes were spent on the date of the encounter in service to the patient including preparation, face-to-face consultation, documentation and care coordination.  The patient was accompanied by her son and daughter.  Dr. Pamelia Hoit was available to discuss this case as needed.   _______________________________________________________________________ For Office Staff:  Number of people involved in session: 3 Was an Intern/ student involved with case: no

## 2024-01-07 NOTE — Telephone Encounter (Signed)
Scheduled appointment per 1/30 scheduling message. Patient is aware of the made appointment and will be mailed an appointment reminder.

## 2024-01-08 ENCOUNTER — Encounter (HOSPITAL_BASED_OUTPATIENT_CLINIC_OR_DEPARTMENT_OTHER): Payer: Self-pay | Admitting: General Surgery

## 2024-01-08 ENCOUNTER — Other Ambulatory Visit: Payer: Self-pay

## 2024-01-12 ENCOUNTER — Encounter (HOSPITAL_BASED_OUTPATIENT_CLINIC_OR_DEPARTMENT_OTHER)
Admission: RE | Admit: 2024-01-12 | Discharge: 2024-01-12 | Disposition: A | Discharge status: Home / Self Care | Payer: Medicare HMO | Source: Ambulatory Visit | Attending: General Surgery | Admitting: General Surgery

## 2024-01-12 ENCOUNTER — Encounter: Payer: Self-pay | Admitting: *Deleted

## 2024-01-12 ENCOUNTER — Telehealth: Payer: Self-pay | Admitting: *Deleted

## 2024-01-12 DIAGNOSIS — I10 Essential (primary) hypertension: Secondary | ICD-10-CM | POA: Insufficient documentation

## 2024-01-12 DIAGNOSIS — Z01812 Encounter for preprocedural laboratory examination: Secondary | ICD-10-CM | POA: Insufficient documentation

## 2024-01-12 LAB — BASIC METABOLIC PANEL
Anion gap: 9 (ref 5–15)
BUN: 7 mg/dL — ABNORMAL LOW (ref 8–23)
CO2: 28 mmol/L (ref 22–32)
Calcium: 8.9 mg/dL (ref 8.9–10.3)
Chloride: 98 mmol/L (ref 98–111)
Creatinine, Ser: 1.01 mg/dL — ABNORMAL HIGH (ref 0.44–1.00)
GFR, Estimated: 60 mL/min — ABNORMAL LOW (ref >60)
Glucose, Bld: 93 mg/dL (ref 70–99)
Potassium: 3.9 mmol/L (ref 3.5–5.1)
Sodium: 135 mmol/L (ref 135–145)

## 2024-01-12 MED ORDER — CHLORHEXIDINE GLUCONATE CLOTH 2 % EX PADS: 6.0000 | MEDICATED_PAD | Freq: Once | CUTANEOUS | Status: DC

## 2024-01-12 NOTE — Progress Notes (Signed)

## 2024-01-12 NOTE — Telephone Encounter (Signed)
Spoke with patient to follow up from [facility] 1/29 and assess navigation needs. Patient denies any questions or concerns at this time. Encouraged her to call should anything arise.

## 2024-01-12 NOTE — Telephone Encounter (Signed)
 [employer] 2021-05 - Specimen Collection Study to Evaluate Biomarkers in Subjects with Cancer    This nurse contacted the pt about the above study.  The pt said that she did not want to participate in this specimen study.  She is scheduled for surgery on 01/15/24, and she said that she is too busy.  The pt was thanked for her consideration of this study.  The nurse wished her well with her surgery and recovery.   Levon FREDRIK Sandifer RN, BSN, CCRP Clinical Research Nurse Lead 01/12/2024 3:20 PM

## 2024-01-14 NOTE — Anesthesia Preprocedure Evaluation (Addendum)
 Anesthesia Evaluation  Patient identified by MRN, date of birth, ID band Patient awake    Reviewed: Allergy & Precautions, NPO status , Patient's Chart, lab work & pertinent test results  History of Anesthesia Complications Negative for: history of anesthetic complications  Airway Mallampati: II  TM Distance: >3 FB Neck ROM: Full    Dental  (+) Dental Advisory Given, Teeth Intact, Missing   Pulmonary shortness of breath, COPD,  COPD inhaler, former smoker 09/29/2020 SARS coronavirus NEG   Pulmonary exam normal breath sounds clear to auscultation       Cardiovascular hypertension, Pt. on medications and Pt. on home beta blockers (-) angina + DOE  + Valvular Problems/Murmurs (Mild-mod TR) AI and MR  Rhythm:Regular Rate:Normal + Systolic murmurs CT Coronary Ca++ 03/2023 1. Coronary calcium  score of 0. 2. Normal coronary origin with right dominance. 3. No evidence of CAD. CAD-RADS 0. No evidence of CAD (0%). Consider non-atherosclerotic causes of chest pain.   Echo 2022  1. Left ventricular ejection fraction, by estimation, is 60 to 65%. The left ventricle has normal function. The left ventricle has no regional wall motion abnormalities. There is mild asymmetric left ventricular hypertrophy of the posterior segment. Indeterminate diastolic filling due to E-A fusion.   2. Right ventricular systolic function is normal. The right ventricular size is normal. There is normal pulmonary artery systolic pressure.   3. The mitral valve is normal in structure. Mild mitral valve regurgitation. No evidence of mitral stenosis.   4. Tricuspid valve regurgitation is mild to moderate.   5. The aortic valve is normal in structure. Aortic valve regurgitation is mild. Mild aortic valve sclerosis is present, with no evidence of aortic valve stenosis.   6. Abdominal aorta is dilated (2.2 cm).   7. The inferior vena cava is normal in size with greater  than 50% respiratory variability, suggesting right atrial pressure of 3 mmHg.     '16 cath: no significant coronary disease   Neuro/Psych negative neurological ROS     GI/Hepatic Neg liver ROS, hiatal hernia,GERD  Medicated and Controlled,,abd pain   Endo/Other  diabetesHypothyroidism  Pre-diabetic: no meds  Renal/GU Renal disease     Musculoskeletal   Abdominal   Peds  Hematology  (+) Blood dyscrasia (Hb 9.7), anemia   Anesthesia Other Findings   Reproductive/Obstetrics                              Anesthesia Physical Anesthesia Plan  ASA: 3  Anesthesia Plan: General   Post-op Pain Management: Tylenol  PO (pre-op)*   Induction: Intravenous  PONV Risk Score and Plan: 2 and Treatment may vary due to age or medical condition, Ondansetron  and Dexamethasone   Airway Management Planned: LMA  Additional Equipment: None  Intra-op Plan:   Post-operative Plan: Extubation in OR  Informed Consent:   Plan Discussed with:   Anesthesia Plan Comments: (Risks of anesthesia explained at length. This includes, but is not limited to, sore throat, damage to teeth, lips gums, tongue and vocal cords, nausea and vomiting, reactions to medications, stroke, heart attack, and death. All patient questions were answered and the patient wishes to proceed. )         Anesthesia Quick Evaluation

## 2024-01-15 ENCOUNTER — Encounter (HOSPITAL_BASED_OUTPATIENT_CLINIC_OR_DEPARTMENT_OTHER): Payer: Self-pay | Admitting: General Surgery

## 2024-01-15 ENCOUNTER — Encounter (HOSPITAL_BASED_OUTPATIENT_CLINIC_OR_DEPARTMENT_OTHER)
Admission: RE | Disposition: A | Discharge status: Home / Self Care | Payer: Self-pay | Source: Home / Self Care | Attending: General Surgery

## 2024-01-15 ENCOUNTER — Other Ambulatory Visit: Payer: Self-pay

## 2024-01-15 ENCOUNTER — Ambulatory Visit (HOSPITAL_BASED_OUTPATIENT_CLINIC_OR_DEPARTMENT_OTHER)
Admission: RE | Admit: 2024-01-15 | Discharge: 2024-01-15 | Disposition: A | Discharge status: Home / Self Care | Payer: Medicare HMO | Attending: General Surgery | Admitting: General Surgery

## 2024-01-15 ENCOUNTER — Ambulatory Visit (HOSPITAL_BASED_OUTPATIENT_CLINIC_OR_DEPARTMENT_OTHER): Payer: Medicare HMO | Admitting: Anesthesiology

## 2024-01-15 DIAGNOSIS — E039 Hypothyroidism, unspecified: Secondary | ICD-10-CM

## 2024-01-15 DIAGNOSIS — Z7984 Long term (current) use of oral hypoglycemic drugs: Secondary | ICD-10-CM | POA: Diagnosis not present

## 2024-01-15 DIAGNOSIS — Z87891 Personal history of nicotine dependence: Secondary | ICD-10-CM | POA: Diagnosis not present

## 2024-01-15 DIAGNOSIS — C50411 Malignant neoplasm of upper-outer quadrant of right female breast: Secondary | ICD-10-CM | POA: Diagnosis not present

## 2024-01-15 DIAGNOSIS — K219 Gastro-esophageal reflux disease without esophagitis: Secondary | ICD-10-CM | POA: Diagnosis not present

## 2024-01-15 DIAGNOSIS — I1310 Hypertensive heart and chronic kidney disease without heart failure, with stage 1 through stage 4 chronic kidney disease, or unspecified chronic kidney disease: Secondary | ICD-10-CM | POA: Insufficient documentation

## 2024-01-15 DIAGNOSIS — N1832 Chronic kidney disease, stage 3b: Secondary | ICD-10-CM | POA: Diagnosis not present

## 2024-01-15 DIAGNOSIS — Z1721 Progesterone receptor positive status: Secondary | ICD-10-CM | POA: Insufficient documentation

## 2024-01-15 DIAGNOSIS — K449 Diaphragmatic hernia without obstruction or gangrene: Secondary | ICD-10-CM | POA: Insufficient documentation

## 2024-01-15 DIAGNOSIS — C50911 Malignant neoplasm of unspecified site of right female breast: Secondary | ICD-10-CM

## 2024-01-15 DIAGNOSIS — Z79899 Other long term (current) drug therapy: Secondary | ICD-10-CM | POA: Diagnosis not present

## 2024-01-15 DIAGNOSIS — I10 Essential (primary) hypertension: Secondary | ICD-10-CM | POA: Diagnosis not present

## 2024-01-15 DIAGNOSIS — N6031 Fibrosclerosis of right breast: Secondary | ICD-10-CM | POA: Insufficient documentation

## 2024-01-15 DIAGNOSIS — J4489 Other specified chronic obstructive pulmonary disease: Secondary | ICD-10-CM | POA: Diagnosis not present

## 2024-01-15 DIAGNOSIS — I083 Combined rheumatic disorders of mitral, aortic and tricuspid valves: Secondary | ICD-10-CM | POA: Insufficient documentation

## 2024-01-15 DIAGNOSIS — J449 Chronic obstructive pulmonary disease, unspecified: Secondary | ICD-10-CM | POA: Diagnosis not present

## 2024-01-15 DIAGNOSIS — Z170 Estrogen receptor positive status [ER+]: Secondary | ICD-10-CM | POA: Insufficient documentation

## 2024-01-15 DIAGNOSIS — E1122 Type 2 diabetes mellitus with diabetic chronic kidney disease: Secondary | ICD-10-CM | POA: Diagnosis not present

## 2024-01-15 HISTORY — PX: BREAST LUMPECTOMY WITH RADIOACTIVE SEED LOCALIZATION: SHX6424

## 2024-01-15 LAB — GLUCOSE, CAPILLARY
Glucose-Capillary: 97 mg/dL (ref 70–99)
Glucose-Capillary: 98 mg/dL (ref 70–99)

## 2024-01-15 SURGERY — BREAST LUMPECTOMY WITH RADIOACTIVE SEED LOCALIZATION
Anesthesia: General | Site: Breast | Laterality: Right

## 2024-01-15 MED ORDER — GABAPENTIN 100 MG PO CAPS
100.0000 mg | ORAL_CAPSULE | ORAL | Status: AC
  Administered 2024-01-15: 100 mg via ORAL

## 2024-01-15 MED ORDER — VANCOMYCIN HCL IN DEXTROSE 1-5 GM/200ML-% IV SOLN
INTRAVENOUS | Status: AC
  Filled 2024-01-15: qty 200

## 2024-01-15 MED ORDER — BUPIVACAINE-EPINEPHRINE (PF) 0.25% -1:200000 IJ SOLN
INTRAMUSCULAR | Status: DC | PRN
  Administered 2024-01-15: 20 mL via PERINEURAL

## 2024-01-15 MED ORDER — DIPHENHYDRAMINE HCL 50 MG/ML IJ SOLN
INTRAMUSCULAR | Status: DC | PRN
  Administered 2024-01-15: 12.5 mg via INTRAVENOUS

## 2024-01-15 MED ORDER — TRAMADOL HCL 50 MG PO TABS
50.0000 mg | ORAL_TABLET | Freq: Once | ORAL | Status: AC
Start: 2024-01-15 — End: 2024-01-15
  Administered 2024-01-15: 50 mg via ORAL

## 2024-01-15 MED ORDER — VANCOMYCIN HCL IN DEXTROSE 1-5 GM/200ML-% IV SOLN
1000.0000 mg | INTRAVENOUS | Status: AC
  Administered 2024-01-15: 1000 mg via INTRAVENOUS

## 2024-01-15 MED ORDER — SODIUM CHLORIDE 0.9 % IV SOLN: INTRAVENOUS | Status: DC | PRN

## 2024-01-15 MED ORDER — PROPOFOL 10 MG/ML IV BOLUS
INTRAVENOUS | Status: DC | PRN
  Administered 2024-01-15: 30 mg via INTRAVENOUS
  Administered 2024-01-15: 120 mg via INTRAVENOUS

## 2024-01-15 MED ORDER — FENTANYL CITRATE (PF) 100 MCG/2ML IJ SOLN
25.0000 ug | INTRAMUSCULAR | Status: DC | PRN
  Administered 2024-01-15: 25 ug via INTRAVENOUS
  Administered 2024-01-15: 50 ug via INTRAVENOUS

## 2024-01-15 MED ORDER — ONDANSETRON HCL 4 MG/2ML IJ SOLN
INTRAMUSCULAR | Status: AC
  Filled 2024-01-15: qty 2

## 2024-01-15 MED ORDER — GABAPENTIN 100 MG PO CAPS
ORAL_CAPSULE | ORAL | Status: AC
  Filled 2024-01-15: qty 1

## 2024-01-15 MED ORDER — 0.9 % SODIUM CHLORIDE (POUR BTL) OPTIME
TOPICAL | Status: DC | PRN
  Administered 2024-01-15: 500 mL

## 2024-01-15 MED ORDER — TRAMADOL HCL 50 MG PO TABS
ORAL_TABLET | ORAL | Status: AC
  Filled 2024-01-15: qty 1

## 2024-01-15 MED ORDER — PROPOFOL 10 MG/ML IV BOLUS
INTRAVENOUS | Status: AC
Start: 2024-01-15 — End: ?
  Filled 2024-01-15: qty 20

## 2024-01-15 MED ORDER — FENTANYL CITRATE (PF) 100 MCG/2ML IJ SOLN
INTRAMUSCULAR | Status: AC
  Filled 2024-01-15: qty 2

## 2024-01-15 MED ORDER — DEXAMETHASONE SODIUM PHOSPHATE 4 MG/ML IJ SOLN
INTRAMUSCULAR | Status: DC | PRN
  Administered 2024-01-15: 5 mg via INTRAVENOUS

## 2024-01-15 MED ORDER — TRAMADOL HCL 50 MG PO TABS: 50.0000 mg | ORAL_TABLET | Freq: Four times a day (QID) | ORAL | 0 refills | Status: AC | PRN

## 2024-01-15 MED ORDER — LIDOCAINE HCL (CARDIAC) PF 100 MG/5ML IV SOSY
PREFILLED_SYRINGE | INTRAVENOUS | Status: DC | PRN
  Administered 2024-01-15: 70 mg via INTRAVENOUS

## 2024-01-15 MED ORDER — LIDOCAINE 2% (20 MG/ML) 5 ML SYRINGE
INTRAMUSCULAR | Status: AC
  Filled 2024-01-15: qty 5

## 2024-01-15 MED ORDER — LACTATED RINGERS IV SOLN: INTRAVENOUS | Status: DC

## 2024-01-15 MED ORDER — ONDANSETRON HCL 4 MG/2ML IJ SOLN
INTRAMUSCULAR | Status: DC | PRN
  Administered 2024-01-15: 4 mg via INTRAVENOUS

## 2024-01-15 MED ORDER — FENTANYL CITRATE (PF) 100 MCG/2ML IJ SOLN
INTRAMUSCULAR | Status: DC | PRN
  Administered 2024-01-15: 50 ug via INTRAVENOUS

## 2024-01-15 MED ORDER — MIDAZOLAM HCL 2 MG/2ML IJ SOLN
INTRAMUSCULAR | Status: AC
  Filled 2024-01-15: qty 2

## 2024-01-15 MED ORDER — DEXAMETHASONE SODIUM PHOSPHATE 10 MG/ML IJ SOLN
INTRAMUSCULAR | Status: AC
  Filled 2024-01-15: qty 1

## 2024-01-15 MED ORDER — DROPERIDOL 2.5 MG/ML IJ SOLN: 0.6250 mg | Freq: Once | INTRAMUSCULAR | Status: DC | PRN

## 2024-01-15 SURGICAL SUPPLY — 34 items
APPLIER CLIP 9.375 MED OPEN (MISCELLANEOUS)
BLADE SURG 15 STRL LF DISP TIS (BLADE) ×1 IMPLANT
CANISTER SUC SOCK COL 7IN (MISCELLANEOUS) ×1 IMPLANT
CANISTER SUCT 1200ML W/VALVE (MISCELLANEOUS) ×1 IMPLANT
CHLORAPREP W/TINT 26 (MISCELLANEOUS) ×1 IMPLANT
CLIP APPLIE 9.375 MED OPEN (MISCELLANEOUS) IMPLANT
COVER BACK TABLE 60X90IN (DRAPES) ×1 IMPLANT
COVER MAYO STAND STRL (DRAPES) ×1 IMPLANT
COVER PROBE CYLINDRICAL 5X96 (MISCELLANEOUS) ×1 IMPLANT
DERMABOND ADVANCED .7 DNX12 (GAUZE/BANDAGES/DRESSINGS) ×1 IMPLANT
DRAPE LAPAROSCOPIC ABDOMINAL (DRAPES) ×1 IMPLANT
DRAPE UTILITY XL STRL (DRAPES) ×1 IMPLANT
ELECT COATED BLADE 2.86 ST (ELECTRODE) ×1 IMPLANT
ELECT REM PT RETURN 9FT ADLT (ELECTROSURGICAL) ×1
ELECTRODE REM PT RTRN 9FT ADLT (ELECTROSURGICAL) ×1 IMPLANT
GLOVE BIO SURGEON STRL SZ7.5 (GLOVE) ×2 IMPLANT
GOWN STRL REUS W/ TWL LRG LVL3 (GOWN DISPOSABLE) ×2 IMPLANT
KIT MARKER MARGIN INK (KITS) ×1 IMPLANT
NDL HYPO 25X1 1.5 SAFETY (NEEDLE) IMPLANT
NEEDLE HYPO 25X1 1.5 SAFETY (NEEDLE) ×1
NS IRRIG 1000ML POUR BTL (IV SOLUTION) IMPLANT
PACK BASIN DAY SURGERY FS (CUSTOM PROCEDURE TRAY) ×1 IMPLANT
PENCIL SMOKE EVACUATOR (MISCELLANEOUS) ×1 IMPLANT
SLEEVE SCD COMPRESS KNEE MED (STOCKING) ×1 IMPLANT
SPIKE FLUID TRANSFER (MISCELLANEOUS) IMPLANT
SPONGE T-LAP 18X18 ~~LOC~~+RFID (SPONGE) ×1 IMPLANT
SUT MON AB 4-0 PC3 18 (SUTURE) ×1 IMPLANT
SUT SILK 2 0 SH (SUTURE) IMPLANT
SUT VICRYL 3-0 CR8 SH (SUTURE) ×1 IMPLANT
SYR CONTROL 10ML LL (SYRINGE) IMPLANT
TOWEL GREEN STERILE FF (TOWEL DISPOSABLE) ×1 IMPLANT
TRAY FAXITRON CT DISP (TRAY / TRAY PROCEDURE) ×1 IMPLANT
TUBE CONNECTING 20X1/4 (TUBING) ×1 IMPLANT
YANKAUER SUCT BULB TIP NO VENT (SUCTIONS) IMPLANT

## 2024-01-15 NOTE — Transfer of Care (Signed)
 Immediate Anesthesia Transfer of Care Note  Patient: Alexis Molina  Procedure(s) Performed: RIGHT BREAST LUMPECTOMY WITH RADIOACTIVE SEED LOCALIZATION (Right: Breast)  Patient Location: PACU  Anesthesia Type:General  Level of Consciousness: awake, alert , and oriented  Airway & Oxygen Therapy: Patient Spontanous Breathing and Patient connected to face mask oxygen  Post-op Assessment: Report given to RN and Post -op Vital signs reviewed and stable  Post vital signs: Reviewed and stable  Last Vitals:  Vitals Value Taken Time  BP 151/75 01/15/24 1036  Temp    Pulse 65 01/15/24 1039  Resp 18 01/15/24 1039  SpO2 99 % 01/15/24 1039  Vitals shown include unfiled device data.  Last Pain:  Vitals:   01/15/24 0805  TempSrc: Oral  PainSc: 0-No pain      Patients Stated Pain Goal: 5 (01/15/24 0805)  Complications: No notable events documented.

## 2024-01-15 NOTE — H&P (Signed)
 REFERRING PHYSICIAN: Gudena, Vinay K, MD PROVIDER: DEWARD GARNETTE NULL, MD MRN: [id number] DOB: [birth year] Subjective   Chief Complaint: Breast Cancer  History of Present Illness: Alexis Molina is a 73 y.o. female who is seen today as an office consultation for evaluation of Breast Cancer  We are asked to see the patient in consultation by Dr. Odean to evaluate her for a new right breast cancer. The patient is a 73 year old Black female who recently went for a routine screening mammogram. At that time she was found to have a 1.2 cm mass in the upper outer quadrant of the right breast. The axilla looked normal. 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 She does not smoke. She has had some asthma and some chest pains. She had a recent cardiac stress test that she reports is negative. She also has a cousin with a history of breast cancer  Review of Systems: A complete review of systems was obtained from the patient. I have reviewed this information and discussed as appropriate with the patient. See HPI as well for other ROS.  ROS   Medical History: Past Medical History:  Diagnosis Date  Anxiety  Diabetes mellitus without complication (CMS/HHS-HCC)  GERD (gastroesophageal reflux disease)  Glaucoma (increased eye pressure)  History of cancer  Hyperlipidemia  Hypertension  Thyroid  disease   Patient Active Problem List  Diagnosis  Allergic rhinitis  Anxiety  Anosmia  Aortic atherosclerosis (CMS-HCC)  Carpal tunnel syndrome of left wrist  Cataracts, bilateral  Angina pectoris (CMS-HCC)  Chronic kidney disease, stage 3b (CMS/HHS-HCC)  Left hand pain  Cystoid macular edema of left eye  Diabetic renal disease (CMS/HHS-HCC)  Diabetes mellitus without complication (CMS/HHS-HCC)  Dysfunctional voiding of urine  Dysgeusia  Enteritis  Hypertensive disorder  Excessive daytime sleepiness   Family history of colonic polyps  Family history of premature CAD  Fatty liver  Gastroesophageal reflux disease  Hyperlipidemia  Hypokalemia  Vitamin D deficiency  Urge urinary incontinence  Tubular adenoma of colon  Raynaud's disease  Recurrent depression (CMS-HCC)  Retinal telangiectasia of both eyes  Osteopenia of lumbar spine  Mixed dyslipidemia  Peripheral edema  Malignant neoplasm of upper-outer quadrant of right breast in female, estrogen receptor positive (CMS/HHS-HCC)  Irritable bowel syndrome with diarrhea  Idiopathic urticaria   Past Surgical History:  Procedure Laterality Date  INTERSTIM PLACEMENT STAGE 1 & 2 05/27/2019  ESOPHAGOGASTRODUODENOSCOPY (EGD) WITH PROPOFOL , bx N/A 10/02/2020  INTERSTIM IMPLANT REMOVAL N/A 05/09/2022  R breast bx Right 12/24/2023    Allergies  Allergen Reactions  Citrus And Derivatives Itching and Swelling  Hydrocodone -Acetaminophen  Hives, Itching and Other (See Comments)  Orange Shortness Of Breath  Other Itching, Other (See Comments) and Swelling  Propoxyphene N-Acetaminophen  Other (See Comments)  Asthma attack  Allerg Xt,D.Farinae-D.Pteronys Itching  Mite Extract Itching  Androgenic Anabolic Steroid Unknown and Other (See Comments)  Betamethasone Other (See Comments)  w/Marcaine , itching  Celecoxib Other (See Comments)  abd pain  Hydrocodone  Bitartrate Unknown  Metformin Other (See Comments)  Other reaction(s): SEVERE diarrhea  Morphine  Itching  Oxycodone Hcl Unknown  Propoxyphene Napsylate Unknown  Tramadol  Hcl Unknown  Glimepiride Itching and Other (See Comments)  Penicillins Hives and Unknown  Has patient had a PCN reaction causing anaphylaxis, immediate rash, facial/tongue/throat swelling, SOB or lightheadedness with hypotension? no, Has patient had a PCN reaction causing severe rash involving mucus membranes or skin necrosis?  no, Has patient had a PCN reaction that required hospitilization?no, , Has patient had a PCN  reaction occurring within the last 10 years? no, If all of the above answers are no then may proceed with cephalosporin use.  Has patient had a PCN reaction causing anaphylaxis, immediate rash, facial/tongue/throat swelling, SOB or lightheadedness with hypotension? no Has patient had a PCN reaction causing severe rash involving mucus membranes or skin necrosis? no Has patient had a PCN reaction that required hospitilization?no  Has patient had a PCN reaction occurring within the last 10 years? no If all of the above answers are no then may proceed with cephalosporin use.  Sulfa (Sulfonamide Antibiotics) Hives   Current Outpatient Medications on File Prior to Visit  Medication Sig Dispense Refill  albuterol MDI, PROVENTIL, VENTOLIN, PROAIR, HFA 90 mcg/actuation inhaler Inhale 2 Inhalations into the lungs every 4 (four) hours as needed  calcium  carbonate 500 mg calcium  (1,250 mg) tablet Take 1,250 mg by mouth as directed Take 1 tablet (1,250 mg total) by mouth 3 (three) times a week.  cetirizine (ZYRTEC) 10 MG tablet Take 10 mg by mouth once daily  fluticasone propion-salmeteroL (ADVAIR DISKUS) 250-50 mcg/dose diskus inhaler Inhale 1 Puff into the lungs as directed Inhale 1 puff in the morning and 1 puff before bedtime.  fluticasone propionate (FLONASE) 50 mcg/actuation nasal spray Place 1-2 sprays into both nostrils once daily  hydrOXYzine (ATARAX) 25 MG tablet Take 25 mg by mouth 3 (three) times daily  levothyroxine  (SYNTHROID ) 50 MCG tablet Take 50 mcg by mouth as directed TAKE 1 TABLET EVERY DAY AT 6AM  meloxicam (MOBIC) 7.5 MG tablet Take 7.5 mg by mouth once daily  methocarbamoL (ROBAXIN) 750 MG tablet Take 750 mg by mouth at bedtime  montelukast (SINGULAIR) 10 mg tablet Take 10 mg by mouth at bedtime  multivitamin with minerals tablet Take 1 tablet by mouth as directed Take 1 tablet by mouth every other day. Gummy bears/ Unknown strength  pantoprazole  (PROTONIX ) 40 MG DR tablet Take 1  tablet by mouth once daily  rosuvastatin  (CRESTOR ) 10 MG tablet Take 1 tablet by mouth once daily  sertraline (ZOLOFT) 25 MG tablet Take 1 tablet by mouth once daily  SITagliptin phosphate (JANUVIA) 100 MG tablet Take 100 mg by mouth once daily  triamterene -hydroCHLOROthiazide (MAXZIDE) 75-50 mg tablet Take 1 tablet by mouth once daily   No current facility-administered medications on file prior to visit.   Family History  Problem Relation Age of Onset  Diabetes Mother  High blood pressure (Hypertension) Mother  High blood pressure (Hypertension) Father  Stroke Father  Lung cancer Brother    Social History   Tobacco Use  Smoking Status Former  Types: Cigarettes  Smokeless Tobacco Never    Social History   Socioeconomic History  Marital status: Widowed  Tobacco Use  Smoking status: Former  Types: Cigarettes  Smokeless tobacco: Never  Vaping Use  Vaping status: Unknown  Substance and Sexual Activity  Alcohol use: Never  Drug use: Never   Social Drivers of Health   Food Insecurity: Low Risk (11/11/2023)  Received from [facility]  Hunger Vital Sign  Worried About Running Out of Food in the Last Year: Never true  Ran Out of Food in the Last Year: Never true  Transportation Needs: No Transportation Needs (11/04/2023)  Received from [facility]  In the past 12 months, has lack of reliable transportation kept you from medical appointments, meetings, work or from getting things needed for daily  living? : No  Housing Stability: Low Risk (11/04/2023)  Received from [facility] Stability Vital Sign  What is your living situation today?: I have a steady place to live  Think about the place you live. Do you have problems with any of the following? Choose all that apply:: None/None on this list   Objective:  There were no vitals filed for this visit.  There is no height or weight on file to calculate BMI.  Physical Exam Vitals reviewed.   Constitutional:  General: She is not in acute distress. Appearance: Normal appearance.  HENT:  Head: Normocephalic and atraumatic.  Right Ear: External ear normal.  Left Ear: External ear normal.  Nose: Nose normal.  Mouth/Throat:  Mouth: Mucous membranes are moist.  Pharynx: Oropharynx is clear.  Eyes:  General: No scleral icterus. Extraocular Movements: Extraocular movements intact.  Conjunctiva/sclera: Conjunctivae normal.  Pupils: Pupils are equal, round, and reactive to light.  Cardiovascular:  Rate and Rhythm: Normal rate and regular rhythm.  Pulses: Normal pulses.  Heart sounds: Normal heart sounds.  Pulmonary:  Effort: Pulmonary effort is normal. No respiratory distress.  Breath sounds: Normal breath sounds.  Abdominal:  General: Bowel sounds are normal.  Palpations: Abdomen is soft.  Tenderness: There is no abdominal tenderness.  Musculoskeletal:  General: No swelling, tenderness or deformity. Normal range of motion.  Cervical back: Normal range of motion and neck supple.  Skin: General: Skin is warm and dry.  Coloration: Skin is not jaundiced.  Neurological:  General: No focal deficit present.  Mental Status: She is alert and oriented to person, place, and time.  Psychiatric:  Mood and Affect: Mood normal.  Behavior: Behavior normal.     Breast: There is no palpable mass in either breast. There is no palpable axillary, supraclavicular, or cervical lymphadenopathy.  Labs, Imaging and Diagnostic Testing:  Assessment and Plan:   Diagnoses and all orders for this visit:  Malignant neoplasm of upper-outer quadrant of right breast in female, estrogen receptor positive (CMS/HHS-HCC)   The patient appears to have a 1.2 cm low-grade cancer with favorable markers and clinically negative nodes in the upper outer quadrant of the right breast. I have discussed with her in detail the different options for treatment and at this point she favors breast conservation  which I feel is very reasonable. She would not need a node evaluation given the characteristics of the cancer. I have discussed with her in detail the risks and benefits of the operation as well as some of the technical aspects including use of a radioactive seed for localization and she understands and wishes to proceed. She will also meet with medical and radiation oncology to discuss adjuvant therapy. We will move forward with surgical scheduling.

## 2024-01-15 NOTE — Discharge Instructions (Signed)
  Post Anesthesia Home Care Instructions  Activity: Get plenty of rest for the remainder of the day. A responsible individual must stay with you for 24 hours following the procedure.  For the next 24 hours, DO NOT: -Drive a car -Advertising copywriter -Drink alcoholic beverages -Take any medication unless instructed by your physician -Make any legal decisions or sign important papers.  Meals: Start with liquid foods such as gelatin or soup. Progress to regular foods as tolerated. Avoid greasy, spicy, heavy foods. If nausea and/or vomiting occur, drink only clear liquids until the nausea and/or vomiting subsides. Call your physician if vomiting continues.  Special Instructions/Symptoms: Your throat may feel dry or sore from the anesthesia or the breathing tube placed in your throat during surgery. If this causes discomfort, gargle with warm salt water. The discomfort should disappear within 24 hours.  If you had a scopolamine patch placed behind your ear for the management of post- operative nausea and/or vomiting:  1. The medication in the patch is effective for 72 hours, after which it should be removed.  Wrap patch in a tissue and discard in the trash. Wash hands thoroughly with soap and water. 2. You may remove the patch earlier than 72 hours if you experience unpleasant side effects which may include dry mouth, dizziness or visual disturbances. 3. Avoid touching the patch. Wash your hands with soap and water after contact with the patch.     Next dose of ibuprofen may be taken at 3p

## 2024-01-15 NOTE — OR Nursing (Signed)
 Once surgical drapes and bovie pad were removed, patient developed notable raised/red areas. Dr. Alethea Andes notified and observed areas. CRNA and PACU notified.

## 2024-01-15 NOTE — Anesthesia Procedure Notes (Signed)
 Procedure Name: LMA Insertion Date/Time: 01/15/2024 9:40 AM  Performed by: Buster Catheryn SAUNDERS, CRNAPre-anesthesia Checklist: Patient identified, Emergency Drugs available, Suction available and Patient being monitored Patient Re-evaluated:Patient Re-evaluated prior to induction Oxygen Delivery Method: Circle system utilized Preoxygenation: Pre-oxygenation with 100% oxygen Induction Type: IV induction Ventilation: Mask ventilation without difficulty LMA: LMA inserted LMA Size: 4.0 Number of attempts: 1 Placement Confirmation: positive ETCO2 Tube secured with: Tape Dental Injury: Teeth and Oropharynx as per pre-operative assessment

## 2024-01-15 NOTE — Op Note (Signed)
 01/15/2024  10:28 AM  PATIENT:  Alexis Molina  73 y.o. female  PRE-OPERATIVE DIAGNOSIS:  RIGHT BREAST CANCER  POST-OPERATIVE DIAGNOSIS:  RIGHT BREAST CANCER  PROCEDURE:  Procedure(s): RIGHT BREAST LUMPECTOMY WITH RADIOACTIVE SEED LOCALIZATION AND MAGSEED LOCALIZATION  SURGEON:  Surgeons and Role:    * Curvin Deward MOULD, MD - Primary  PHYSICIAN ASSISTANT:   ASSISTANTS: none   ANESTHESIA:   local and general  EBL:  25 mL   BLOOD ADMINISTERED:none  DRAINS: none   LOCAL MEDICATIONS USED:  MARCAINE      SPECIMEN:  Source of Specimen:  right breast tissue with additional medial, lateral, and deep margins  DISPOSITION OF SPECIMEN:  PATHOLOGY  COUNTS:  YES  TOURNIQUET:  * No tourniquets in log *  DICTATION: .Dragon Dictation  After informed consent was obtained the patient was brought to the operating room and placed in the supine position on the operating table.  After adequate induction of general anesthesia the patient's right chest, breast, and axillary area were prepped with ChloraPrep, allowed to dry, and draped in usual sterile manner.  An appropriate timeout was performed.  Previously an I-125 seed as well as a Magseed were placed in the outer aspect of the right breast to mark an area of invasive breast cancer.  The neoprobe and Sentimag were both used to identify the area of signal.  Because the signal was superficial to the skin I elected to make an elliptical incision in the skin on the outer aspect of the right breast overlying the area.  The area around this was infiltrated with quarter percent Marcaine .  The incision was carried through the skin and subcutaneous tissue sharply with the electrocautery.  Dissection was then carried around the [facility] and radioactive seed widely with the electrocautery while checking the area of signal frequently.  Once the tissue was removed it was oriented with the appropriate paint colors.  A specimen radiograph was obtained that showed  the clip and 2 seeds to be near the center of the specimen.  I did elect to take an additional medial, lateral, and deep margin and these were marked appropriately.  All of the tissue was then sent to pathology for further evaluation.  Hemostasis was achieved using the Bovie electrocautery.  The wound was irrigated with saline and infiltrated with more quarter percent Marcaine .  The deep layer of the incision was then closed with layers of interrupted 3-0 Vicryl stitches.  The skin was then closed with a running 4-0 Monocryl subcuticular stitch.  Dermabond dressings were applied.  The patient tolerated the procedure well.  At the end of the case all needle sponge and instrument counts were correct.  The patient was then awakened and taken to recovery in stable condition.  PLAN OF CARE: Discharge to home after PACU  PATIENT DISPOSITION:  PACU - hemodynamically stable.   Delay start of Pharmacological VTE agent (>24hrs) due to surgical blood loss or risk of bleeding: not applicable

## 2024-01-15 NOTE — Anesthesia Postprocedure Evaluation (Signed)
 Anesthesia Post Note  Patient: Alexis Molina  Procedure(s) Performed: RIGHT BREAST LUMPECTOMY WITH RADIOACTIVE SEED LOCALIZATION (Right: Breast)     Patient location during evaluation: PACU Anesthesia Type: General Level of consciousness: sedated and patient cooperative Pain management: pain level controlled Vital Signs Assessment: post-procedure vital signs reviewed and stable Respiratory status: spontaneous breathing Cardiovascular status: stable Anesthetic complications: no   No notable events documented.  Last Vitals:  Vitals:   01/15/24 1145 01/15/24 1200  BP: (!) 140/89 (!) 144/68  Pulse: 65 (!) 59  Resp: 16 18  Temp:  36.5 C  SpO2: 100% 98%    Last Pain:  Vitals:   01/15/24 1200  TempSrc:   PainSc: 5                  Norleen Pope

## 2024-01-15 NOTE — Interval H&P Note (Signed)
 History and Physical Interval Note:  01/15/2024 9:19 AM  Alexis Molina  has presented today for surgery, with the diagnosis of RIGHT BREAST CANCER.  The various methods of treatment have been discussed with the patient and family. After consideration of risks, benefits and other options for treatment, the patient has consented to  Procedure(s): RIGHT BREAST LUMPECTOMY WITH RADIOACTIVE SEED LOCALIZATION (Right) as a surgical intervention.  The patient's history has been reviewed, patient examined, no change in status, stable for surgery.  I have reviewed the patient's chart and labs.  Questions were answered to the patient's satisfaction.     Deward Null III

## 2024-01-16 ENCOUNTER — Encounter (HOSPITAL_BASED_OUTPATIENT_CLINIC_OR_DEPARTMENT_OTHER): Payer: Self-pay | Admitting: General Surgery

## 2024-01-18 LAB — SURGICAL PATHOLOGY

## 2024-01-19 ENCOUNTER — Encounter: Payer: Self-pay | Admitting: *Deleted

## 2024-01-19 ENCOUNTER — Telehealth: Payer: Self-pay | Admitting: *Deleted

## 2024-01-19 NOTE — Telephone Encounter (Signed)
Received order for Oncotype Testing per Dr. Pamelia Hoit. Requisition faxed to pathology and [facility]

## 2024-01-20 ENCOUNTER — Other Ambulatory Visit: Payer: Self-pay

## 2024-01-20 ENCOUNTER — Encounter: Payer: Self-pay | Admitting: General Surgery

## 2024-01-21 ENCOUNTER — Ambulatory Visit: Payer: Medicare HMO

## 2024-01-21 ENCOUNTER — Ambulatory Visit: Payer: Medicare HMO | Attending: Cardiology | Admitting: Cardiology

## 2024-01-21 ENCOUNTER — Encounter: Payer: Self-pay | Admitting: Cardiology

## 2024-01-21 VITALS — BP 110/70 | HR 88 | Ht 62.0 in | Wt 156.6 lb

## 2024-01-21 DIAGNOSIS — I10 Essential (primary) hypertension: Secondary | ICD-10-CM

## 2024-01-21 DIAGNOSIS — I700 Atherosclerosis of aorta: Secondary | ICD-10-CM

## 2024-01-21 DIAGNOSIS — R002 Palpitations: Secondary | ICD-10-CM | POA: Diagnosis not present

## 2024-01-21 DIAGNOSIS — E782 Mixed hyperlipidemia: Secondary | ICD-10-CM | POA: Diagnosis not present

## 2024-01-21 NOTE — Progress Notes (Signed)
Cardiology Office Note:    Date:  01/21/2024   ID:  Alexis Molina, Alexis Molina 10-May-1951, MRN [id number]  PCP:  Montez Hageman, DO  Cardiologist:  Garwin Brothers, MD   Referring MD: Soundra Pilon, FNP    ASSESSMENT:    1. Aortic atherosclerosis (HCC)   2. Essential hypertension   3. Mixed hyperlipidemia    PLAN:    In order of problems listed above:  Aortic atherosclerosis: Secondary prevention stressed to the patient.  Importance of compliance with diet medication stressed and she vocalized understanding.  She was advised to walk at least half an hour a day 5 days a week and she promises to do so. Chest pain: Atypical in nature.  Calcium score has been 0 recently.  I reassured the patient about my findings. Mixed dyslipidemia: On lipid-lowering medications followed by primary care.  Goal LDL should be less than 70. Palpitations: These are concerning the patient and we will do a 2-week monitor to assess this. Patient will be seen in follow-up appointment in 6 months or earlier if the patient has any concerns.    Medication Adjustments/Labs and Tests Ordered: Current medicines are reviewed at length with the patient today.  Concerns regarding medicines are outlined above.  No orders of the defined types were placed in this encounter.  No orders of the defined types were placed in this encounter.    No chief complaint on file.    History of Present Illness:    Alexis Molina is a 73 y.o. female.  Patient has past medical history of aortic atherosclerosis, essential hypertension and mixed dyslipidemia.  She denies any problems at this time and takes care of activities of daily living.  She has had lumpectomy for breast cancer and pending radiation.  At the time of my evaluation, the patient is alert awake oriented and in no distress.  She gives history of palpitations occurring on a daily basis.  The patient has no dizziness.  Past Medical History:  Diagnosis Date    Abdominal pain 09/29/2020   Acute gastritis 07/22/2021   Allergic rhinitis    Anemia due to chronic blood loss 07/22/2021   Angina pectoris (HCC) 01/28/2017   Anosmia 12/18/2020   Anxiety    Aortic atherosclerosis (HCC) 04/01/2023   Body mass index (BMI) 30.0-30.9, adult 07/22/2021   Cancer (HCC) 12/2023   right breast IDC   Carpal tunnel syndrome of left wrist 06/03/2016   Cataracts, bilateral    Chest pain of uncertain etiology 07/23/2021   Chronic kidney disease, stage 3b (HCC) 01/24/2022   Chronic left shoulder pain 09/18/2016   Cystoid macular edema of left eye 05/19/2022   New onset   Diabetes mellitus without complication (HCC)    Diabetic renal disease (HCC) 01/24/2022   Dysfunctional voiding of urine 02/24/2019   Dysgeusia 12/18/2020   Enteritis 09/28/2020   Essential hypertension 07/23/2021   Family history of colonic polyps 07/22/2021   Family history of premature CAD 05/11/2015   Fatigue    Fatty liver    Gastroesophageal reflux disease 07/22/2021   Generalized abdominal pain 09/29/2020   Hemangioma of intra-abdominal structure 07/22/2021   Hiatal hernia    History of colonic polyps    History of gastritis 07/22/2021   History of vitrectomy 05/19/2022   Hyperglycemia due to type 2 diabetes mellitus (HCC) 07/22/2021   Hyperlipidemia    Hypertension    Hypertensive disorder 12/08/1988   Hypokalemia    Hypomagnesemia  Hypoproteinemia (HCC) 07/22/2021   Hypothyroidism    Idiopathic urticaria 03/31/2023   Incontinence of feces with fecal urgency 02/24/2019   Irritable bowel syndrome with diarrhea 07/22/2021   Left epiretinal membrane 05/19/2022   Malignant neoplasm of upper-outer quadrant of right breast in female, estrogen receptor positive (HCC) 01/04/2024   Medication management 01/24/2022   Mixed dyslipidemia 07/23/2021   Neck pain 12/15/2016   Obesity, unspecified    Osteopenia of lumbar spine 07/22/2021   Other allergy status, other than to drugs  and biological substances 07/22/2021   Other vitreous opacities, right eye 09/11/2020   Peripheral edema    Peripheral edema    after knee surgery   Posterior vitreous detachment of both eyes 09/11/2020   Posterior vitreous detachment of right eye 09/11/2020   Prediabetes 07/22/2021   Primary open-angle glaucoma, bilateral, mild stage 01/24/2022   Problematic vaginal discharge 07/22/2021   Pruritus 03/31/2023   Raynaud's disease 07/22/2021   Raynaud's syndrome without gangrene    Recurrent depression (HCC) 07/22/2021   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   S/P arthroscopy of left shoulder 04/07/2017   Shortness of breath 12/12/2019   Snake bite    Subacute cough 03/31/2023   Tear of left rotator cuff 12/15/2016   Trigger middle finger of left hand 01/24/2016   Tubular adenoma of colon    Type 2 diabetes mellitus with other specified complication (HCC) 05/11/2015   no meds per patient   Urge urinary incontinence 02/24/2019   Urinary urgency 09/18/2021   Vaginal lesion 07/22/2021   Vitamin D deficiency    Vitreous floaters of right eye    Vulvitis 07/22/2021    Past Surgical History:  Procedure Laterality Date   ABDOMINAL HYSTERECTOMY     BIOPSY  10/02/2020   Procedure: BIOPSY;  Surgeon: Charlott Rakes, MD;  Location: [facility];  Service: Endoscopy;;   BREAST LUMPECTOMY WITH RADIOACTIVE SEED LOCALIZATION Right 01/15/2024   Procedure: RIGHT BREAST LUMPECTOMY WITH RADIOACTIVE SEED LOCALIZATION;  Surgeon: Griselda Miner, MD;  Location: [facility];  Service: General;  Laterality: Right;   CARDIAC CATHETERIZATION N/A 07/30/2015   Procedure: Left Heart Cath and Coronary Angiography;  Surgeon: Corky Crafts, MD;  Location: [facility] INVAS[facility] CV LAB;  Service: Cardiovascular;  Laterality: N/A;   CARPAL TUNNEL RELEASE     CHOLECYSTECTOMY     ESOPHAGOGASTRODUODENOSCOPY (EGD) WITH PROPOFOL N/A 10/02/2020   Procedure: ESOPHAGOGASTRODUODENOSCOPY (EGD) WITH  PROPOFOL;  Surgeon: Charlott Rakes, MD;  Location: [facility];  Service: Endoscopy;  Laterality: N/A;   KNEE SURGERY     ROTATOR CUFF REPAIR Left    TUBAL LIGATION      Current Medications: Current Meds  Medication Sig   acetaminophen (TYLENOL) 500 MG tablet Take 500 mg by mouth as needed for mild pain (pain score 1-3) or moderate pain (pain score 4-6).   albuterol (VENTOLIN HFA) 108 (90 Base) MCG/ACT inhaler Inhale 2 puffs into the lungs every 4 (four) hours as needed for wheezing or shortness of breath.   dicyclomine (BENTYL) 20 MG tablet Take 20 mg by mouth 3 (three) times daily.   diphenhydrAMINE (BENADRYL) 25 MG tablet Take 25 mg by mouth every 6 (six) hours as needed for itching.   fluticasone (FLONASE) 50 MCG/ACT nasal spray Place 1-2 sprays into both nostrils daily.   ketorolac (ACULAR) 0.5 % ophthalmic solution INSTILL 1 DROP INTO LEFT EYE 4 TIMES DAILY   latanoprost (XALATAN) 0.005 % ophthalmic solution Place 1 drop into both eyes daily.  levothyroxine (SYNTHROID) 50 MCG tablet Take 50 mcg by mouth daily.   lisinopril (ZESTRIL) 10 MG tablet Take 10 mg by mouth daily.   Multiple Vitamin (MULTIVITAMIN WITH MINERALS) TABS tablet Take 1 tablet by mouth every other day. Gummy bears/ Unknown strength   nitroGLYCERIN (NITROSTAT) 0.4 MG SL tablet Place 0.4 mg under the tongue every 5 (five) minutes as needed for chest pain.   pantoprazole (PROTONIX) 40 MG tablet Take 1 tablet by mouth daily.   rosuvastatin (CRESTOR) 20 MG tablet Take 20 mg by mouth daily.   sertraline (ZOLOFT) 25 MG tablet Take 1 tablet by mouth daily.   timolol (BETIMOL) 0.25 % ophthalmic solution 1-2 drops 2 (two) times daily.   traMADol (ULTRAM) 50 MG tablet Take 1 tablet (50 mg total) by mouth every 6 (six) hours as needed.   triamterene-hydrochlorothiazide (MAXZIDE) 75-50 MG tablet Take 1 tablet by mouth daily.     Allergies:   Darvocet [propoxyphene n-acetaminophen], Orange fruit [citrus], Vicodin  [hydrocodone-acetaminophen], Dust mite extract, Betamethasone, Celecoxib, Metformin, Morphine, Oxycodone-acetaminophen, Wound dressing adhesive, Glimepiride, Penicillins, and Sulfa antibiotics   Social History   Socioeconomic History   Marital status: Widowed    Spouse name: Not on file   Number of children: Not on file   Years of education: Not on file   Highest education level: Not on file  Occupational History   Not on file  Tobacco Use   Smoking status: Former    Current packs/day: 0.00    Types: Cigarettes    Quit date: 12/09/1987    Years since quitting: 36.1   Smokeless tobacco: Never  Vaping Use   Vaping status: Never Used  Substance and Sexual Activity   Alcohol use: No   Drug use: No   Sexual activity: Not Currently    Birth control/protection: Surgical    Comment: hyst  Other Topics Concern   Not on file  Social History Narrative   Not on file   Social Drivers of Health   Financial Resource Strain: Not on file  Food Insecurity: No Food Insecurity (01/06/2024)   Hunger Vital Sign    Worried About Running Out of Food in the Last Year: Never true    Ran Out of Food in the Last Year: Never true  Transportation Needs: No Transportation Needs (11/04/2023)   Received from [employer]    In the past 12 months, has lack of reliable transportation kept you from medical appointments, meetings, work or from getting things needed for daily living? : No  Physical Activity: Not on file  Stress: Not on file  Social Connections: Not on file     Family History: The patient's family history includes Alzheimer's disease in her mother; Arthritis in her father; Diabetes in her mother; Heart failure in her father; Hypertension in her father and mother; Lung cancer (age of onset: 35) in her brother; Ovarian cancer in her daughter; Prostate cancer in her paternal uncle; Ulcers in her father; Uterine cancer (age of onset: 4) in her sister.  ROS:   Please see the  history of present illness.    All other systems reviewed and are negative.  EKGs/Labs/Other Studies Reviewed:    The following studies were reviewed today: I discussed my findings with the patient at length    Recent Labs: 01/06/2024: ALT 18; Hemoglobin 13.1; Platelet Count 257 01/12/2024: BUN 7; Creatinine, Ser 1.01; Potassium 3.9; Sodium 135  Recent Lipid Panel No results found for: "CHOL", "TRIG", "HDL", "CHOLHDL", "  VLDL", "LDLCALC", "LDLDIRECT"  Physical Exam:    VS:  BP 110/70   Pulse 88   Ht 5\' 2"  (1.575 m)   Wt 156 lb 9.6 oz (71 kg)   SpO2 95%   BMI 28.64 kg/m     Wt Readings from Last 3 Encounters:  01/21/24 156 lb 9.6 oz (71 kg)  01/15/24 155 lb 6.8 oz (70.5 kg)  01/06/24 159 lb (72.1 kg)     GEN: Patient is in no acute distress HEENT: Normal NECK: No JVD; No carotid bruits LYMPHATICS: No lymphadenopathy CARDIAC: Hear sounds regular, 2/6 systolic murmur at the apex. RESPIRATORY:  Clear to auscultation without rales, wheezing or rhonchi  ABDOMEN: Soft, non-tender, non-distended MUSCULOSKELETAL:  No edema; No deformity  SKIN: Warm and dry NEUROLOGIC:  Alert and oriented x 3 PSYCHIATRIC:  Normal affect   Signed, Garwin Brothers, MD  01/21/2024 2:36 PM    [facility]

## 2024-01-21 NOTE — Patient Instructions (Signed)
Medication Instructions:  Your physician recommends that you continue on your current medications as directed. Please refer to the Current Medication list given to you today.  *If you need a refill on your cardiac medications before your next appointment, please call your pharmacy*   Lab Work: None ordered If you have labs (blood work) drawn today and your tests are completely normal, you will receive your results only by: MyChart Message (if you have MyChart) OR A paper copy in the mail If you have any lab test that is abnormal or we need to change your treatment, we will call you to review the results.   Testing/Procedures: A zio monitor was ordered today. It will remain on for 14 days. Remove 02/05/2024. You will then return monitor and event diary in provided box. It takes 1-2 weeks for report to be downloaded and returned to [place]. We will call you with the results. If monitor falls off or has orange flashing light, please call Zio for further instructions.    Follow-Up: At [facility], you and your health needs are our priority.  As part of our continuing mission to provide you with exceptional heart care, we have created designated Provider Care Teams.  These Care Teams include your primary Cardiologist (physician) and Advanced Practice Providers (APPs -  Physician Assistants and Nurse Practitioners) who all work together to provide you with the care you need, when you need it.  We recommend signing up for the patient portal called "MyChart".  Sign up information is provided on this After Visit Summary.  MyChart is used to connect with patients for Virtual Visits (Telemedicine).  Patients are able to view lab/test results, encounter notes, upcoming appointments, etc.  Non-urgent messages can be sent to your provider as well.   To learn more about what you can do with MyChart, go to [email or web address].    Your next appointment:   12 month(s)  The format for your next  appointment:   In Person  Provider:   Belva Crome, MD    Other Instructions none  Important Information About [employer]

## 2024-01-25 ENCOUNTER — Encounter: Payer: Self-pay | Admitting: Genetic Counselor

## 2024-01-25 ENCOUNTER — Ambulatory Visit: Payer: Self-pay | Admitting: Genetic Counselor

## 2024-01-25 DIAGNOSIS — Z1379 Encounter for other screening for genetic and chromosomal anomalies: Secondary | ICD-10-CM

## 2024-01-25 DIAGNOSIS — Z8041 Family history of malignant neoplasm of ovary: Secondary | ICD-10-CM

## 2024-01-25 DIAGNOSIS — Z8042 Family history of malignant neoplasm of prostate: Secondary | ICD-10-CM

## 2024-01-25 DIAGNOSIS — Z170 Estrogen receptor positive status [ER+]: Secondary | ICD-10-CM

## 2024-01-25 NOTE — Progress Notes (Signed)
HPI:   Alexis Molina was previously seen in the [facility] due to a personal history of breast cancer, a Molina history of ovarian cancer and metastatic prostate cancer, and concerns regarding a hereditary predisposition to cancer.    Alexis Molina recent genetic test results were disclosed to her by telephone. These results and recommendations are discussed in more detail below.  CANCER HISTORY:  Oncology History  Malignant neoplasm of upper-outer quadrant of right breast in female, estrogen receptor positive (HCC)  12/24/2023 Initial Diagnosis   Screening mammogram detected right upper outer quadrant mass 1 cm by ultrasound at 10 o'clock position.  Axilla negative.  Biopsy: Grade 1 IDC ER 100%, PR 100%, Ki67 10%, HER2 negative by [facility]   01/04/2024 Cancer Staging   Staging form: Breast, AJCC 8th Edition - Clinical stage from 01/04/2024: Stage IA (cT1b, cN0, cM0, G1, ER+, PR+, HER2-) - Signed by Ronny Bacon, PA-C on 01/04/2024 Stage prefix: Initial diagnosis Method of lymph node assessment: Clinical Histologic grading system: 3 grade system   01/20/2024 Genetic Testing   Negative [facility] CancerNext-Expanded +RNAinsight Panel.  Report date is 01/20/2024.  The CancerNext-Expanded gene panel offered by [facility] and includes sequencing, rearrangement, and RNA analysis for the following 76 genes: AIP, ALK, APC, ATM, AXIN2, BAP1, BARD1, BMPR1A, BRCA1, BRCA2, BRIP1, CDC73, CDH1, CDK4, CDKN1B, CDKN2A, CEBPA, CHEK2, CTNNA1, DDX41, DICER1, ETV6, FH, FLCN, GATA2, LZTR1, MAX, MBD4, MEN1, MET, MLH1, MSH2, MSH3, MSH6, MUTYH, NF1, NF2, NTHL1, PALB2, PHOX2B, PMS2, POT1, PRKAR1A, PTCH1, PTEN, RAD51C, RAD51D, RB1, RET, RUNX1, SDHA, SDHAF2, SDHB, SDHC, SDHD, SMAD4, SMARCA4, SMARCB1, SMARCE1, STK11, SUFU, TMEM127, TP53, TSC1, TSC2, VHL, and WT1 (sequencing and deletion/duplication); EGFR, HOXB13, KIT, MITF, PDGFRA, POLD1, and POLE (sequencing only); EPCAM and GREM1 (deletion/duplication  only).      Molina HISTORY:  We obtained a detailed, 4-generation Molina history.  Significant diagnoses are listed below:      Molina History  Problem Relation Age of Onset   Uterine cancer Sister 4        d. 4   Lung cancer Brother 54        d. 63   Prostate cancer Paternal Uncle          d. 100; mets   Ovarian cancer Daughter          dx 9s     Alexis Molina is unaware of previous Molina history of genetic testing for hereditary cancer risks. There is no reported Ashkenazi Jewish ancestry. There is no known consanguinity.    GENETIC TEST RESULTS:  The [facility] CancerNext-Expanded +RNAsight Panel found no pathogenic mutations.   The CancerNext-Expanded gene panel offered by [facility] and includes sequencing, rearrangement, and RNA analysis for the following 76 genes: AIP, ALK, APC, ATM, AXIN2, BAP1, BARD1, BMPR1A, BRCA1, BRCA2, BRIP1, CDC73, CDH1, CDK4, CDKN1B, CDKN2A, CEBPA, CHEK2, CTNNA1, DDX41, DICER1, ETV6, FH, FLCN, GATA2, LZTR1, MAX, MBD4, MEN1, MET, MLH1, MSH2, MSH3, MSH6, MUTYH, NF1, NF2, NTHL1, PALB2, PHOX2B, PMS2, POT1, PRKAR1A, PTCH1, PTEN, RAD51C, RAD51D, RB1, RET, RUNX1, SDHA, SDHAF2, SDHB, SDHC, SDHD, SMAD4, SMARCA4, SMARCB1, SMARCE1, STK11, SUFU, TMEM127, TP53, TSC1, TSC2, VHL, and WT1 (sequencing and deletion/duplication); EGFR, HOXB13, KIT, MITF, PDGFRA, POLD1, and POLE (sequencing only); EPCAM and GREM1 (deletion/duplication only).   The test report has been scanned into EPIC and is located under the Molecular Pathology section of the Results Review tab.  A portion of the result report is included below for reference. Genetic testing reported out on January 20, 2024.  Even though a pathogenic variant was not identified, possible explanations for the cancer in the Molina may include: There may be no hereditary risk for cancer in the Molina. The cancers in Alexis Molina may be sporadic/familial or due to other genetic and environmental factors.   Most cancer is not hereditary.  There may be a gene mutation in one of these genes that current testing methods cannot detect but that chance is small. There could be another gene that has not yet been discovered, or that we have not yet tested, that is responsible for the cancer diagnoses in the Molina.  It is also possible there is a hereditary cause for the cancer in the Molina that Alexis Molina did not inherit.   Therefore, it is important to remain in touch with cancer genetics in the future so that we can continue to offer Alexis Molina the most up to date genetic testing.    ADDITIONAL GENETIC TESTING:   Alexis Molina genetic testing was fairly extensive.  If there are additional relevant genes identified to increase cancer risk that can be analyzed in the future, we would be happy to discuss and coordinate this testing at that time.     CANCER SCREENING RECOMMENDATIONS:  Alexis Molina test result is considered negative (normal).  This means that we have not identified a hereditary cause for her personal history of breast cancer at this time.    An individual's cancer risk and medical management are not determined by genetic test results alone. Overall cancer risk assessment incorporates additional factors, including personal medical history, Molina history, and any available genetic information that may result in a personalized plan for cancer prevention and surveillance. Therefore, it is recommended she continue to follow the cancer management and screening guidelines provided by her oncology and primary healthcare provider.    RECOMMENDATIONS FOR Molina MEMBERS:   Since she did not inherit a identifiable mutation in a cancer predisposition gene included on this panel, her children could not have inherited a known mutation from her in one of these genes. Individuals in this Molina might be at some increased risk of developing cancer, over the general population risk, due to the Molina history  of cancer.  Individuals in the Molina should notify their providers of the Molina history of cancer. We recommend women in this Molina have a yearly mammogram beginning at age 60, or 53 years younger than the earliest onset of cancer, an annual clinical breast exam, and perform monthly breast self-exams.  Risk models that take into account Molina history and hormonal history may be helpful in determining appropriate breast cancer screening options for Molina members. Female relatives should speak with their providers about prostate cancer screening.  Other members of the Molina may still carry a pathogenic variant in one of these genes that Alexis Molina did not inherit. Based on the Molina history, we recommend her daughter, who was diagnosed with ovarian cancer, have genetic counseling and testing. Alexis Molina can let us know if we can be of any assistance in coordinating genetic counseling and/or testing for these Molina member.     FOLLOW-UP:  Cancer genetics is a rapidly advancing field and it is possible that new genetic tests will be appropriate for her and/or her Molina members in the future. We encourage Alexis Molina to remain in contact with cancer genetics, so we can update her personal and Molina histories and let her know of advances in cancer genetics that may benefit this Molina.  Our contact number was provided.  They are welcome to call us at anytime with additional questions or concerns.   Dayami Molina M. Rennie Plowman, MS, [facility] Genetic Counselor [email or web address] (P) [phone]

## 2024-02-01 ENCOUNTER — Encounter: Payer: Self-pay | Admitting: *Deleted

## 2024-02-01 ENCOUNTER — Telehealth: Payer: Self-pay | Admitting: *Deleted

## 2024-02-01 NOTE — Telephone Encounter (Signed)
 Received oncotype score of 28. Physician team notified.

## 2024-02-04 ENCOUNTER — Encounter (HOSPITAL_COMMUNITY): Payer: Self-pay

## 2024-02-08 ENCOUNTER — Inpatient Hospital Stay: Payer: Medicare HMO | Attending: Hematology and Oncology | Admitting: Hematology and Oncology

## 2024-02-08 VITALS — BP 131/57 | HR 58 | Temp 97.6°F | Resp 18 | Ht 62.0 in | Wt 156.3 lb

## 2024-02-08 DIAGNOSIS — Z170 Estrogen receptor positive status [ER+]: Secondary | ICD-10-CM

## 2024-02-08 DIAGNOSIS — Z79899 Other long term (current) drug therapy: Secondary | ICD-10-CM | POA: Diagnosis not present

## 2024-02-08 DIAGNOSIS — C50411 Malignant neoplasm of upper-outer quadrant of right female breast: Secondary | ICD-10-CM | POA: Diagnosis present

## 2024-02-08 DIAGNOSIS — Z1721 Progesterone receptor positive status: Secondary | ICD-10-CM | POA: Insufficient documentation

## 2024-02-08 DIAGNOSIS — Z1732 Human epidermal growth factor receptor 2 negative status: Secondary | ICD-10-CM | POA: Insufficient documentation

## 2024-02-08 DIAGNOSIS — R109 Unspecified abdominal pain: Secondary | ICD-10-CM | POA: Diagnosis not present

## 2024-02-08 DIAGNOSIS — Z87891 Personal history of nicotine dependence: Secondary | ICD-10-CM | POA: Diagnosis not present

## 2024-02-08 NOTE — Assessment & Plan Note (Addendum)
 12/24/2023:Screening mammogram detected right upper outer quadrant mass 1 cm by ultrasound at 10 o'clock position.  Axilla negative.  Biopsy: Grade 1 IDC ER 100%, PR 100%, Ki67 10%, HER2 negative by [facility]   01/15/2024: Right lumpectomy: 1.6 cm grade 1 IDC with focal high-grade DCIS, margins negative, ER 100%, PR 100%, HER2 1+, Ki-67 10% Oncotype DX: 28 (distant recurrence at 9 years: 17%)  Recommendations: 1.  Adjuvant chemotherapy with Taxotere and Cytoxan every 3 weeks x 4 cycles 2. Adjuvant radiation therapy followed by 3. Adjuvant antiestrogen therapy ---------------------------------------------------------------------------------------------------------------------------------------------------- Chemotherapy Counseling: I discussed the risks and benefits of chemotherapy including the risks of nausea/ vomiting, risk of infection from low WBC count, fatigue due to chemo or anemia, bruising or bleeding due to low platelets, mouth sores, loss/ change in taste and decreased appetite. Liver and kidney function will be monitored through out chemotherapy as abnormalities in liver and kidney function may be a side effect of treatment.  Neuropathy risk from Taxotere was discussed in detail. Risk of permanent bone marrow dysfunction and leukemia due to chemo were also discussed.  Plan: Port placement, chemo class Return to clinic to start chemotherapy

## 2024-02-08 NOTE — Progress Notes (Signed)
 Patient Care Team: Montez Hageman, DO as PCP - General (Family Medicine) Corky Crafts, MD as PCP - Cardiology (Cardiology) Pershing Proud, RN as Oncology Nurse Navigator Donnelly Angelica, RN as Oncology Nurse Navigator Serena Croissant, MD as Consulting Physician (Hematology and Oncology)  DIAGNOSIS:  Encounter Diagnosis  Name Primary?   Malignant neoplasm of upper-outer quadrant of right breast in female, estrogen receptor positive (HCC) Yes    SUMMARY OF ONCOLOGIC HISTORY: Oncology History  Malignant neoplasm of upper-outer quadrant of right breast in female, estrogen receptor positive (HCC)  12/24/2023 Initial Diagnosis   Screening mammogram detected right upper outer quadrant mass 1 cm by ultrasound at 10 o'clock position.  Axilla negative.  Biopsy: Grade 1 IDC ER 100%, PR 100%, Ki67 10%, HER2 negative by [facility]   01/04/2024 Cancer Staging   Staging form: Breast, AJCC 8th Edition - Clinical stage from 01/04/2024: Stage IA (cT1b, cN0, cM0, G1, ER+, PR+, HER2-) - Signed by Ronny Bacon, PA-C on 01/04/2024 Stage prefix: Initial diagnosis Method of lymph node assessment: Clinical Histologic grading system: 3 grade system   01/15/2024 Surgery   Right lumpectomy: 1.6 cm grade 1 IDC with focal high-grade DCIS, margins negative, ER 100%, PR 100%, HER2 1+, Ki-67 10%   01/20/2024 Genetic Testing   Negative [facility] CancerNext-Expanded +RNAinsight Panel.  Report date is 01/20/2024.  The CancerNext-Expanded gene panel offered by [facility] and includes sequencing, rearrangement, and RNA analysis for the following 76 genes: AIP, ALK, APC, ATM, AXIN2, BAP1, BARD1, BMPR1A, BRCA1, BRCA2, BRIP1, CDC73, CDH1, CDK4, CDKN1B, CDKN2A, CEBPA, CHEK2, CTNNA1, DDX41, DICER1, ETV6, FH, FLCN, GATA2, LZTR1, MAX, MBD4, MEN1, MET, MLH1, MSH2, MSH3, MSH6, MUTYH, NF1, NF2, NTHL1, PALB2, PHOX2B, PMS2, POT1, PRKAR1A, PTCH1, PTEN, RAD51C, RAD51D, RB1, RET, RUNX1, SDHA, SDHAF2, SDHB, SDHC, SDHD, SMAD4,  SMARCA4, SMARCB1, SMARCE1, STK11, SUFU, TMEM127, TP53, TSC1, TSC2, VHL, and WT1 (sequencing and deletion/duplication); EGFR, HOXB13, KIT, MITF, PDGFRA, POLD1, and POLE (sequencing only); EPCAM and GREM1 (deletion/duplication only).    01/27/2024 Oncotype testing   Oncotype DX: 28 (distant recurrence at 9 years: 17%)     CHIEF COMPLIANT: F/U to discuss Oncotype  HISTORY OF PRESENT ILLNESS:   History of Present Illness Mirana, a patient with a recent diagnosis of breast cancer, presents for a discussion of her Oncotype test results. The test, which assesses the need for chemotherapy, returned a score of 28, placing her in the high-risk category. Nat Math expresses surprise at the recommendation for chemotherapy, as she had anticipated only needing radiation therapy.  In addition to her cancer diagnosis, Nat Math reports ongoing stomach problems, the cause of which remains undetermined despite a CT scan. She experiences constant pain in her right side, which she describes as being located in the area of her pancreas and liver. She has been prescribed dicyclomine for suspected spasms or cramping, but reports that the medication has not been effective. She also mentions a history of bronchitis, after which she lost her sense of taste and smell.     ALLERGIES:  is allergic to darvocet [propoxyphene n-acetaminophen], orange fruit [citrus], vicodin [hydrocodone-acetaminophen], dust mite extract, betamethasone, celecoxib, metformin, morphine, oxycodone-acetaminophen, wound dressing adhesive, glimepiride, penicillins, and sulfa antibiotics.  MEDICATIONS:  Current Outpatient Medications  Medication Sig Dispense Refill   acetaminophen (TYLENOL) 500 MG tablet Take 500 mg by mouth as needed for mild pain (pain score 1-3) or moderate pain (pain score 4-6).     albuterol (VENTOLIN HFA) 108 (90 Base) MCG/ACT inhaler Inhale 2 puffs into the  lungs every 4 (four) hours as needed for wheezing or shortness of breath.      dicyclomine (BENTYL) 20 MG tablet Take 20 mg by mouth 3 (three) times daily.     diphenhydrAMINE (BENADRYL) 25 MG tablet Take 25 mg by mouth every 6 (six) hours as needed for itching.     fluticasone (FLONASE) 50 MCG/ACT nasal spray Place 1-2 sprays into both nostrils daily.     ketorolac (ACULAR) 0.5 % ophthalmic solution INSTILL 1 DROP INTO LEFT EYE 4 TIMES DAILY 5 mL 0   latanoprost (XALATAN) 0.005 % ophthalmic solution Place 1 drop into both eyes daily.     levothyroxine (SYNTHROID) 50 MCG tablet Take 50 mcg by mouth daily.     lisinopril (ZESTRIL) 10 MG tablet Take 10 mg by mouth daily.     Multiple Vitamin (MULTIVITAMIN WITH MINERALS) TABS tablet Take 1 tablet by mouth every other day. Gummy bears/ Unknown strength     nitroGLYCERIN (NITROSTAT) 0.4 MG SL tablet Place 0.4 mg under the tongue every 5 (five) minutes as needed for chest pain.     pantoprazole (PROTONIX) 40 MG tablet Take 1 tablet by mouth daily.     rosuvastatin (CRESTOR) 20 MG tablet Take 20 mg by mouth daily.     sertraline (ZOLOFT) 25 MG tablet Take 1 tablet by mouth daily.     timolol (BETIMOL) 0.25 % ophthalmic solution 1-2 drops 2 (two) times daily.     traMADol (ULTRAM) 50 MG tablet Take 1 tablet (50 mg total) by mouth every 6 (six) hours as needed. 20 tablet 0   triamterene-hydrochlorothiazide (MAXZIDE) 75-50 MG tablet Take 1 tablet by mouth daily.     No current facility-administered medications for this visit.    PHYSICAL EXAMINATION: ECOG PERFORMANCE STATUS: 1 - Symptomatic but completely ambulatory  Vitals:   02/08/24 1158  BP: (!) 131/57  Pulse: (!) 58  Resp: 18  Temp: 97.6 F (36.4 C)  SpO2: 98%   Filed Weights   02/08/24 1158  Weight: 156 lb 4.8 oz (70.9 kg)     LABORATORY DATA:  I have reviewed the data as listed    Latest Ref Rng & Units 01/12/2024    3:06 PM 01/06/2024   12:26 PM 04/10/2023   10:24 AM  CMP  Glucose 70 - 99 mg/dL 93  438  381   BUN 8 - 23 mg/dL 7  8  13    Creatinine 0.44  - 1.00 mg/dL 8.40  3.75  4.36   Sodium 135 - 145 mmol/L 135  135  145   Potassium 3.5 - 5.1 mmol/L 3.9  3.3  4.2   Chloride 98 - 111 mmol/L 98  97  105   CO2 22 - 32 mmol/L 28  29  24    Calcium 8.9 - 10.3 mg/dL 8.9  9.9  9.3   Total Protein 6.5 - 8.1 g/dL  7.6    Total Bilirubin 0.0 - 1.2 mg/dL  1.1    Alkaline Phos 38 - 126 U/L  53    AST 15 - 41 U/L  24    ALT 0 - 44 U/L  18      Lab Results  Component Value Date   WBC 5.1 01/06/2024   HGB 13.1 01/06/2024   HCT 38.2 01/06/2024   MCV 79.7 (L) 01/06/2024   PLT 257 01/06/2024   NEUTROABS 4.0 01/06/2024    ASSESSMENT & PLAN:  Malignant neoplasm of upper-outer quadrant of right breast in female,  estrogen receptor positive (HCC) 12/24/2023:Screening mammogram detected right upper outer quadrant mass 1 cm by ultrasound at 10 o'clock position.  Axilla negative.  Biopsy: Grade 1 IDC ER 100%, PR 100%, Ki67 10%, HER2 negative by [facility]   01/15/2024: Right lumpectomy: 1.6 cm grade 1 IDC with focal high-grade DCIS, margins negative, ER 100%, PR 100%, HER2 1+, Ki-67 10% Oncotype DX: 28 (distant recurrence at 9 years: 17%)  Recommendations: 1.  Adjuvant chemotherapy with Taxotere and Cytoxan every 3 weeks x 4 cycles (patient is contemplating on her options) 2. Adjuvant radiation therapy followed by 3. Adjuvant antiestrogen therapy ---------------------------------------------------------------------------------------------------------------------------------------------------- Chemotherapy Counseling: I discussed the risks and benefits of chemotherapy including the risks of nausea/ vomiting, risk of infection from low WBC count, fatigue due to chemo or anemia, bruising or bleeding due to low platelets, mouth sores, loss/ change in taste and decreased appetite. Liver and kidney function will be monitored through out chemotherapy as abnormalities in liver and kidney function may be a side effect of treatment.  Neuropathy risk from Taxotere was  discussed in detail. Risk of permanent bone marrow dysfunction and leukemia due to chemo were also discussed.  Plan: Patient will think about the plan and inform us of her decision reg chemo After that we will request Port placement, chemo class Return to clinic to start chemotherapy  ------------------------------------- Assessment and Plan Assessment & Plan Breast Cancer Oncotype score of 28, indicating a high risk of distant recurrence (17% over 10 years). Discussed the benefits of chemotherapy in reducing this risk by approximately half. -Consider chemotherapy regimen of Taxotere and Cytoxan (TC), given once every three weeks for four treatments. -Plan to implant a port for chemotherapy administration. -Continue with planned radiation therapy after chemotherapy. -Start antiestrogen therapy after radiation.  Abdominal Pain Ongoing issue with unclear etiology. Recent CT scan showed findings in the pancreas and liver. Currently on dicyclomine for suspected spasms or cramping, but with limited relief. -Consider second opinion for further evaluation and management.      No orders of the defined types were placed in this encounter.  The patient has a good understanding of the overall plan. she agrees with it. she will call with any problems that may develop before the next visit here. Total time spent: 30 mins including face to face time and time spent for planning, charting and co-ordination of care   Tamsen Meek, MD 02/08/24

## 2024-02-09 ENCOUNTER — Encounter: Payer: Self-pay | Admitting: *Deleted

## 2024-02-09 DIAGNOSIS — Z170 Estrogen receptor positive status [ER+]: Secondary | ICD-10-CM

## 2024-02-11 ENCOUNTER — Other Ambulatory Visit: Payer: Self-pay | Admitting: Radiation Oncology

## 2024-02-11 ENCOUNTER — Ambulatory Visit
Admission: RE | Admit: 2024-02-11 | Discharge: 2024-02-11 | Disposition: A | Discharge status: Home / Self Care | Source: Ambulatory Visit | Attending: Radiation Oncology | Admitting: Radiation Oncology

## 2024-02-11 ENCOUNTER — Encounter: Payer: Self-pay | Admitting: Radiation Oncology

## 2024-02-11 VITALS — BP 121/61 | HR 63 | Temp 98.1°F | Resp 20 | Ht 62.0 in | Wt 153.0 lb

## 2024-02-11 DIAGNOSIS — I129 Hypertensive chronic kidney disease with stage 1 through stage 4 chronic kidney disease, or unspecified chronic kidney disease: Secondary | ICD-10-CM | POA: Insufficient documentation

## 2024-02-11 DIAGNOSIS — I700 Atherosclerosis of aorta: Secondary | ICD-10-CM | POA: Diagnosis not present

## 2024-02-11 DIAGNOSIS — Z87891 Personal history of nicotine dependence: Secondary | ICD-10-CM | POA: Diagnosis not present

## 2024-02-11 DIAGNOSIS — Z170 Estrogen receptor positive status [ER+]: Secondary | ICD-10-CM

## 2024-02-11 DIAGNOSIS — M8580 Other specified disorders of bone density and structure, unspecified site: Secondary | ICD-10-CM | POA: Diagnosis not present

## 2024-02-11 DIAGNOSIS — E039 Hypothyroidism, unspecified: Secondary | ICD-10-CM | POA: Insufficient documentation

## 2024-02-11 DIAGNOSIS — G8929 Other chronic pain: Secondary | ICD-10-CM | POA: Diagnosis not present

## 2024-02-11 DIAGNOSIS — I7300 Raynaud's syndrome without gangrene: Secondary | ICD-10-CM | POA: Insufficient documentation

## 2024-02-11 DIAGNOSIS — K219 Gastro-esophageal reflux disease without esophagitis: Secondary | ICD-10-CM | POA: Insufficient documentation

## 2024-02-11 DIAGNOSIS — Z79890 Hormone replacement therapy (postmenopausal): Secondary | ICD-10-CM | POA: Diagnosis not present

## 2024-02-11 DIAGNOSIS — N1832 Chronic kidney disease, stage 3b: Secondary | ICD-10-CM | POA: Insufficient documentation

## 2024-02-11 DIAGNOSIS — Z801 Family history of malignant neoplasm of trachea, bronchus and lung: Secondary | ICD-10-CM | POA: Diagnosis not present

## 2024-02-11 DIAGNOSIS — K760 Fatty (change of) liver, not elsewhere classified: Secondary | ICD-10-CM | POA: Diagnosis not present

## 2024-02-11 DIAGNOSIS — E782 Mixed hyperlipidemia: Secondary | ICD-10-CM | POA: Diagnosis not present

## 2024-02-11 DIAGNOSIS — C50411 Malignant neoplasm of upper-outer quadrant of right female breast: Secondary | ICD-10-CM

## 2024-02-11 DIAGNOSIS — Z79899 Other long term (current) drug therapy: Secondary | ICD-10-CM | POA: Insufficient documentation

## 2024-02-11 DIAGNOSIS — E1122 Type 2 diabetes mellitus with diabetic chronic kidney disease: Secondary | ICD-10-CM | POA: Insufficient documentation

## 2024-02-11 DIAGNOSIS — E876 Hypokalemia: Secondary | ICD-10-CM | POA: Insufficient documentation

## 2024-02-11 DIAGNOSIS — Z8042 Family history of malignant neoplasm of prostate: Secondary | ICD-10-CM | POA: Insufficient documentation

## 2024-02-11 NOTE — Progress Notes (Signed)
 Nursing interview for Malignant neoplasm of upper-outer quadrant of right breast in female, estrogen receptor positive (HCC) Stage IA (cT1b, cN0, cM0, G1, ER+, PR+, HER2-)    Patient identity verified x2.  Patient reports RT breast tenderness 4/10 at the incision line and fatigue. Patient does have some persistent palpations w/o pain. Patient denies arm pain, SOB, dizziness any other related issues at this time.  Meaningful use complete.  Vitals- BP 121/61 (BP Location: Left Arm, Patient Position: Sitting, Cuff Size: Normal)   Pulse 63   Temp 98.1 F (36.7 C) (Temporal)   Resp 20   Ht 5\' 2"  (1.575 m)   Wt 153 lb (69.4 kg)   SpO2 99%   BMI 27.98 kg/m   This concludes the interaction.  Ruel Favors, LPN

## 2024-02-12 ENCOUNTER — Ambulatory Visit: Admitting: Radiation Oncology

## 2024-02-12 ENCOUNTER — Telehealth: Payer: Self-pay

## 2024-02-12 DIAGNOSIS — R002 Palpitations: Secondary | ICD-10-CM

## 2024-02-12 NOTE — Progress Notes (Signed)
 Radiation Oncology         [phone] ________________________________  Name: Alexis Molina        MRN: [id number]  Date of Service: 02/11/2024 DOB: 02-18-1951  CC:Montez Hageman, DO  Serena Croissant, MD     REFERRING PHYSICIAN: Serena Croissant, MD   DIAGNOSIS: The encounter diagnosis was Malignant neoplasm of upper-outer quadrant of right breast in female, estrogen receptor positive (HCC).   HISTORY OF PRESENT ILLNESS: Alexis Molina is a 73 y.o. female originally seen in the multidisciplinary breast clinic for a new diagnosis of right breast cancer. She was found on screening mammogram to have a possible mass in the right breast in the upper outer quadrant.  Further diagnostic workup confirmed a mass, and by ultrasound this measured 1 cm in greatest dimension in the 10 o'clock position.  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  Her cancer was ER/PR positive, HER2 negative with a Ki-67 of 10%.   Since her last visit, the patient has undergone lumpectomy of the right breast on 01/15/2024, this showed a grade 1 invasive ductal carcinoma measuring 1.6 cm with focal high-grade DCIS, her margins were all clear for invasive and in situ disease.  No nodes were submitted.  Genetic testing was negative for actionable mutations, and Oncotype DX score was 28.  She was offered systemic chemotherapy based on this, however declined.  She is seen to discuss adjuvant radiotherapy.      PREVIOUS RADIATION THERAPY: No   PAST MEDICAL HISTORY:  Past Medical History:  Diagnosis Date   Abdominal pain 09/29/2020   Acute gastritis 07/22/2021   Allergic rhinitis    Anemia due to chronic blood loss 07/22/2021   Angina pectoris (HCC) 01/28/2017   Anosmia 12/18/2020   Anxiety    Aortic atherosclerosis (HCC) 04/01/2023   Body mass index (BMI) 30.0-30.9, adult 07/22/2021    Cancer (HCC) 12/2023   right breast IDC   Carpal tunnel syndrome of left wrist 06/03/2016   Cataracts, bilateral    Chest pain of uncertain etiology 07/23/2021   Chronic kidney disease, stage 3b (HCC) 01/24/2022   Chronic left shoulder pain 09/18/2016   Cystoid macular edema of left eye 05/19/2022   New onset   Diabetes mellitus without complication (HCC)    Diabetic renal disease (HCC) 01/24/2022   Dysfunctional voiding of urine 02/24/2019   Dysgeusia 12/18/2020   Enteritis 09/28/2020   Essential hypertension 07/23/2021   Family history of colonic polyps 07/22/2021   Family history of premature CAD 05/11/2015   Fatigue    Fatty liver    Gastroesophageal reflux disease 07/22/2021   Generalized abdominal pain 09/29/2020   Hemangioma of intra-abdominal structure 07/22/2021   Hiatal hernia    History of colonic polyps    History of gastritis 07/22/2021   History of vitrectomy 05/19/2022   Hyperglycemia due to type 2 diabetes mellitus (HCC) 07/22/2021   Hyperlipidemia    Hypertension    Hypertensive disorder 12/08/1988   Hypokalemia    Hypomagnesemia    Hypoproteinemia (HCC) 07/22/2021   Hypothyroidism    Idiopathic urticaria 03/31/2023   Incontinence of feces with fecal urgency 02/24/2019   Irritable bowel syndrome with diarrhea 07/22/2021   Left epiretinal membrane 05/19/2022   Malignant neoplasm of upper-outer quadrant of right breast in female, estrogen receptor positive (HCC) 01/04/2024   Medication management 01/24/2022   Mixed dyslipidemia  07/23/2021   Neck pain 12/15/2016   Obesity, unspecified    Osteopenia of lumbar spine 07/22/2021   Other allergy status, other than to drugs and biological substances 07/22/2021   Other vitreous opacities, right eye 09/11/2020   Peripheral edema    Peripheral edema    after knee surgery   Posterior vitreous detachment of both eyes 09/11/2020   Posterior vitreous detachment of right eye 09/11/2020   Prediabetes 07/22/2021    Primary open-angle glaucoma, bilateral, mild stage 01/24/2022   Problematic vaginal discharge 07/22/2021   Pruritus 03/31/2023   Raynaud's disease 07/22/2021   Raynaud's syndrome without gangrene    Recurrent depression (HCC) 07/22/2021   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   S/P arthroscopy of left shoulder 04/07/2017   Shortness of breath 12/12/2019   Snake bite    Subacute cough 03/31/2023   Tear of left rotator cuff 12/15/2016   Trigger middle finger of left hand 01/24/2016   Tubular adenoma of colon    Type 2 diabetes mellitus with other specified complication (HCC) 05/11/2015   no meds per patient   Urge urinary incontinence 02/24/2019   Urinary urgency 09/18/2021   Vaginal lesion 07/22/2021   Vitamin D deficiency    Vitreous floaters of right eye    Vulvitis 07/22/2021       PAST SURGICAL HISTORY: Past Surgical History:  Procedure Laterality Date   ABDOMINAL HYSTERECTOMY     BIOPSY  10/02/2020   Procedure: BIOPSY;  Surgeon: Charlott Rakes, MD;  Location: [facility];  Service: Endoscopy;;   BREAST LUMPECTOMY WITH RADIOACTIVE SEED LOCALIZATION Right 01/15/2024   Procedure: RIGHT BREAST LUMPECTOMY WITH RADIOACTIVE SEED LOCALIZATION;  Surgeon: Griselda Miner, MD;  Location: [facility];  Service: General;  Laterality: Right;   CARDIAC CATHETERIZATION N/A 07/30/2015   Procedure: Left Heart Cath and Coronary Angiography;  Surgeon: Corky Crafts, MD;  Location: [facility] CV LAB;  Service: Cardiovascular;  Laterality: N/A;   CARPAL TUNNEL RELEASE     CHOLECYSTECTOMY     ESOPHAGOGASTRODUODENOSCOPY (EGD) WITH PROPOFOL N/A 10/02/2020   Procedure: ESOPHAGOGASTRODUODENOSCOPY (EGD) WITH PROPOFOL;  Surgeon: Charlott Rakes, MD;  Location: [facility];  Service: Endoscopy;  Laterality: N/A;   KNEE SURGERY     ROTATOR CUFF REPAIR Left    TUBAL LIGATION       FAMILY HISTORY:  Family History  Problem Relation Age of Onset   Alzheimer's  disease Mother    Diabetes Mother    Hypertension Mother    Ulcers Father    Hypertension Father    Arthritis Father    Heart failure Father    Uterine cancer Sister 4       d. 4   Lung cancer Brother 19       d. 16   Prostate cancer Paternal Uncle        d. 48; mets   Ovarian cancer Daughter        dx 30s     SOCIAL HISTORY:  reports that she quit smoking about 36 years ago. Her smoking use included cigarettes. She has never used smokeless tobacco. She reports that she does not drink alcohol and does not use drugs. The patient is widowed and lives in [place].    ALLERGIES: Darvocet [propoxyphene n-acetaminophen], Orange fruit [citrus], Vicodin [hydrocodone-acetaminophen], Dust mite extract, Betamethasone, Celecoxib, Metformin, Morphine, Oxycodone-acetaminophen, Wound dressing adhesive, Glimepiride, Penicillins, and Sulfa antibiotics   MEDICATIONS:  Current Outpatient Medications  Medication Sig Dispense Refill   acetaminophen (TYLENOL)  500 MG tablet Take 500 mg by mouth as needed for mild pain (pain score 1-3) or moderate pain (pain score 4-6).     albuterol (VENTOLIN HFA) 108 (90 Base) MCG/ACT inhaler Inhale 2 puffs into the lungs every 4 (four) hours as needed for wheezing or shortness of breath.     dicyclomine (BENTYL) 20 MG tablet Take 20 mg by mouth 3 (three) times daily. (Patient not taking: Reported on 02/08/2024)     diphenhydrAMINE (BENADRYL) 25 MG tablet Take 25 mg by mouth every 6 (six) hours as needed for itching.     ketorolac (ACULAR) 0.5 % ophthalmic solution INSTILL 1 DROP INTO LEFT EYE 4 TIMES DAILY 5 mL 0   latanoprost (XALATAN) 0.005 % ophthalmic solution Place 1 drop into both eyes daily.     levothyroxine (SYNTHROID) 50 MCG tablet Take 50 mcg by mouth daily.     lisinopril (ZESTRIL) 10 MG tablet Take 10 mg by mouth daily.     Multiple Vitamin (MULTIVITAMIN WITH MINERALS) TABS tablet Take 1 tablet by mouth every other day. Gummy bears/ Unknown strength      nitroGLYCERIN (NITROSTAT) 0.4 MG SL tablet Place 0.4 mg under the tongue every 5 (five) minutes as needed for chest pain.     pantoprazole (PROTONIX) 40 MG tablet Take 1 tablet by mouth daily.     rosuvastatin (CRESTOR) 20 MG tablet Take 20 mg by mouth daily.     sertraline (ZOLOFT) 25 MG tablet Take 1 tablet by mouth daily.     timolol (BETIMOL) 0.25 % ophthalmic solution 1-2 drops 2 (two) times daily.     traMADol (ULTRAM) 50 MG tablet Take 1 tablet (50 mg total) by mouth every 6 (six) hours as needed. (Patient not taking: Reported on 02/08/2024) 20 tablet 0   triamterene-hydrochlorothiazide (MAXZIDE) 75-50 MG tablet Take 1 tablet by mouth daily.     No current facility-administered medications for this encounter.     REVIEW OF SYSTEMS: On review of systems, the patient reports that she is doing well since her surgery. She has noticed increasing fullness in her right breast. She denies any fevers, redness, or drainage. No other complaints are verbalized.      PHYSICAL EXAM:  Wt Readings from Last 3 Encounters:  02/11/24 153 lb (69.4 kg)  02/08/24 156 lb 4.8 oz (70.9 kg)  01/21/24 156 lb 9.6 oz (71 kg)   Temp Readings from Last 3 Encounters:  02/11/24 98.1 F (36.7 C) (Temporal)  02/08/24 97.6 F (36.4 C) (Temporal)  01/15/24 97.7 F (36.5 C)   BP Readings from Last 3 Encounters:  02/11/24 121/61  02/08/24 (!) 131/57  01/21/24 110/70   Pulse Readings from Last 3 Encounters:  02/11/24 63  02/08/24 (!) 58  01/21/24 88    In general this is a well appearing female in no acute distress. She's alert and oriented x4 and appropriate throughout the examination. Cardiopulmonary assessment is negative for acute distress and she exhibits normal effort. The right breast reveals a well healed surgical incision site, and fullness with fluctuance. No erythema, separation, or drainage is noted.     ECOG = 1  0 - Asymptomatic (Fully active, able to carry on all predisease activities  without restriction)  1 - Symptomatic but completely ambulatory (Restricted in physically strenuous activity but ambulatory and able to carry out work of a light or sedentary nature. For example, light housework, office work)  2 - Symptomatic, <50% in bed during the day (Ambulatory and capable  of all self care but unable to carry out any work activities. Up and about more than 50% of waking hours)  3 - Symptomatic, >50% in bed, but not bedbound (Capable of only limited self-care, confined to bed or chair 50% or more of waking hours)  4 - Bedbound (Completely disabled. Cannot carry on any self-care. Totally confined to bed or chair)  5 - Death   Santiago Glad MM, Creech RH, Tormey DC, et al. [phone]). "Toxicity and response criteria of the [facility]". Am. Evlyn Clines. Oncol. 5 (6): 649-55    LABORATORY DATA:  Lab Results  Component Value Date   WBC 5.1 01/06/2024   HGB 13.1 01/06/2024   HCT 38.2 01/06/2024   MCV 79.7 (L) 01/06/2024   PLT 257 01/06/2024   Lab Results  Component Value Date   NA 135 01/12/2024   K 3.9 01/12/2024   CL 98 01/12/2024   CO2 28 01/12/2024   Lab Results  Component Value Date   ALT 18 01/06/2024   AST 24 01/06/2024   ALKPHOS 53 01/06/2024   BILITOT 1.1 01/06/2024      RADIOGRAPHY: No results found.     IMPRESSION/PLAN: 1. Stage IA, pT1cN0M0, grade 1, ER/PR positive invasive ductal carcinoma of the right breast. Dr. Mitzi Hansen has reviewed her final pathology findings and  today we reviewed the nature of early-stage breast disease and reviewed her workup since her last visit.  While she is felt to have an increased risk of recurrence warranting a role for chemotherapy, the patient is not interested in this treatment. Dr. Pamelia Hoit anticipates adjuvant antiestrogen therapy as well which she is considering.  We discussed the role of adjuvant radiotherapy including the risks, benefits, short, and long term effects of radiotherapy, as well as the  curative intent, and the patient is interested in proceeding.  We discussed the options of 4 weeks of treatment to the right breast versus ultra hypofractionated once weekly for 5 weeks which the patient is motivated to receive given the distance she lives from our treatment center.  She was also offered second opinion if she wanted treatment closer to home in [facility] but prefers to remain in her care. 2. Postoperative seroma.  The patient will be referred to physical therapy to see if this can be improved with compression and massage.  She is encouraged to resume wearing her compressive bra for the next week, we will proceed with simulation following the improvement of this and will tentatively reschedule this for the week of 02/22/24 or 02/29/24.  In a visit lasting 45 minutes, greater than 50% of the time was spent face to face discussing the patient's condition, in preparation for the discussion, and coordinating the patient's care.     Osker Mason, [facility]    **Disclaimer: This note was dictated with voice recognition software. Similar sounding words can inadvertently be transcribed and this note may contain transcription errors which may not have been corrected upon publication of note.**

## 2024-02-12 NOTE — Telephone Encounter (Signed)
 Informed patient that per Dr. Tomie China her heart monitor she wore was normal.  She thanked me for the call and had no additonal questions.

## 2024-02-15 ENCOUNTER — Encounter: Payer: Self-pay | Admitting: *Deleted

## 2024-02-25 ENCOUNTER — Encounter: Payer: Self-pay | Admitting: *Deleted

## 2024-02-25 ENCOUNTER — Ambulatory Visit
Admission: RE | Admit: 2024-02-25 | Discharge: 2024-02-25 | Disposition: A | Discharge status: Home / Self Care | Source: Ambulatory Visit | Attending: Radiation Oncology | Admitting: Radiation Oncology

## 2024-02-25 DIAGNOSIS — C50411 Malignant neoplasm of upper-outer quadrant of right female breast: Secondary | ICD-10-CM | POA: Insufficient documentation

## 2024-02-25 DIAGNOSIS — Z510 Encounter for antineoplastic radiation therapy: Secondary | ICD-10-CM | POA: Diagnosis present

## 2024-02-25 NOTE — Progress Notes (Signed)
 I saw the pt back in simulation and noted her seroma in the right breast has improved significantly. We will proceed with simulation today and begin her treatment next week.

## 2024-03-01 ENCOUNTER — Other Ambulatory Visit: Payer: Self-pay | Admitting: *Deleted

## 2024-03-01 ENCOUNTER — Ambulatory Visit

## 2024-03-01 DIAGNOSIS — Z510 Encounter for antineoplastic radiation therapy: Secondary | ICD-10-CM | POA: Diagnosis not present

## 2024-03-01 DIAGNOSIS — Z170 Estrogen receptor positive status [ER+]: Secondary | ICD-10-CM

## 2024-03-01 NOTE — Therapy (Unsigned)
 OUTPATIENT PHYSICAL THERAPY  UPPER EXTREMITY ONCOLOGY EVALUATION  Patient Name: Alexis Molina MRN: [id number] DOB:Aug 02, 1951, 73 y.o., female Today's Date: 03/02/2024  END OF SESSION:  PT End of Session - 03/02/24 1351     Visit Number 1    Number of Visits 1    Date for PT Re-Evaluation 03/02/24    PT Start Time 1200    PT Stop Time 1250    PT Time Calculation (min) 50 min             Past Medical History:  Diagnosis Date   Abdominal pain 09/29/2020   Acute gastritis 07/22/2021   Allergic rhinitis    Anemia due to chronic blood loss 07/22/2021   Angina pectoris (HCC) 01/28/2017   Anosmia 12/18/2020   Anxiety    Aortic atherosclerosis (HCC) 04/01/2023   Body mass index (BMI) 30.0-30.9, adult 07/22/2021   Cancer (HCC) 12/2023   right breast IDC   Carpal tunnel syndrome of left wrist 06/03/2016   Cataracts, bilateral    Chest pain of uncertain etiology 07/23/2021   Chronic kidney disease, stage 3b (HCC) 01/24/2022   Chronic left shoulder pain 09/18/2016   Cystoid macular edema of left eye 05/19/2022   New onset   Diabetes mellitus without complication (HCC)    Diabetic renal disease (HCC) 01/24/2022   Dysfunctional voiding of urine 02/24/2019   Dysgeusia 12/18/2020   Enteritis 09/28/2020   Essential hypertension 07/23/2021   Family history of colonic polyps 07/22/2021   Family history of premature CAD 05/11/2015   Fatigue    Fatty liver    Gastroesophageal reflux disease 07/22/2021   Generalized abdominal pain 09/29/2020   Hemangioma of intra-abdominal structure 07/22/2021   Hiatal hernia    History of colonic polyps    History of gastritis 07/22/2021   History of vitrectomy 05/19/2022   Hyperglycemia due to type 2 diabetes mellitus (HCC) 07/22/2021   Hyperlipidemia    Hypertension    Hypertensive disorder 12/08/1988   Hypokalemia    Hypomagnesemia    Hypoproteinemia (HCC) 07/22/2021   Hypothyroidism    Idiopathic urticaria 03/31/2023    Incontinence of feces with fecal urgency 02/24/2019   Irritable bowel syndrome with diarrhea 07/22/2021   Left epiretinal membrane 05/19/2022   Malignant neoplasm of upper-outer quadrant of right breast in female, estrogen receptor positive (HCC) 01/04/2024   Medication management 01/24/2022   Mixed dyslipidemia 07/23/2021   Neck pain 12/15/2016   Obesity, unspecified    Osteopenia of lumbar spine 07/22/2021   Other allergy status, other than to drugs and biological substances 07/22/2021   Other vitreous opacities, right eye 09/11/2020   Peripheral edema    Peripheral edema    after knee surgery   Posterior vitreous detachment of both eyes 09/11/2020   Posterior vitreous detachment of right eye 09/11/2020   Prediabetes 07/22/2021   Primary open-angle glaucoma, bilateral, mild stage 01/24/2022   Problematic vaginal discharge 07/22/2021   Pruritus 03/31/2023   Raynaud's disease 07/22/2021   Raynaud's syndrome without gangrene    Recurrent depression (HCC) 07/22/2021   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   S/P arthroscopy of left shoulder 04/07/2017   Shortness of breath 12/12/2019   Snake bite    Subacute cough 03/31/2023   Tear of left rotator cuff 12/15/2016   Trigger middle finger of left hand 01/24/2016   Tubular adenoma of colon    Type 2 diabetes mellitus with other specified complication (HCC) 05/11/2015   no meds per patient  Urge urinary incontinence 02/24/2019   Urinary urgency 09/18/2021   Vaginal lesion 07/22/2021   Vitamin D deficiency    Vitreous floaters of right eye    Vulvitis 07/22/2021   Past Surgical History:  Procedure Laterality Date   ABDOMINAL HYSTERECTOMY     BIOPSY  10/02/2020   Procedure: BIOPSY;  Surgeon: Charlott Rakes, MD;  Location: [facility];  Service: Endoscopy;;   BREAST LUMPECTOMY WITH RADIOACTIVE SEED LOCALIZATION Right 01/15/2024   Procedure: RIGHT BREAST LUMPECTOMY WITH RADIOACTIVE SEED LOCALIZATION;  Surgeon: Griselda Miner, MD;  Location: [facility];  Service: General;  Laterality: Right;   CARDIAC CATHETERIZATION N/A 07/30/2015   Procedure: Left Heart Cath and Coronary Angiography;  Surgeon: Corky Crafts, MD;  Location[facility] INVASIVE CV LAB;  Service: Cardiovascular;  Laterality: N/A;   CARPAL TUNNEL RELEASE     CHOLECYSTECTOMY     ESOPHAGOGASTRODUODENOSCOPY (EGD) WITH PROPOFOL N/A 10/02/2020   Procedure: ESOPHAGOGASTRODUODENOSCOPY (EGD) WITH PROPOFOL;  Surgeon: Charlott Rakes, MD;  Location: [facility];  Service: Endoscopy;  Laterality: N/A;   KNEE SURGERY     ROTATOR CUFF REPAIR Left    TUBAL LIGATION     Patient Active Problem List   Diagnosis Date Noted   Genetic testing 01/25/2024   Malignant neoplasm of upper-outer quadrant of right breast in female, estrogen receptor positive (HCC) 01/04/2024   Cancer (HCC) 12/2023   Aortic atherosclerosis (HCC) 04/01/2023   Idiopathic urticaria 03/31/2023   Pruritus 03/31/2023   Subacute cough 03/31/2023   History of vitrectomy 05/19/2022   Left epiretinal membrane 05/19/2022   Cystoid macular edema of left eye 05/19/2022   Diabetes mellitus without complication (HCC) 01/24/2022   History of colonic polyps 01/24/2022   Hypertension 01/24/2022   Raynaud's syndrome without gangrene 01/24/2022   Chronic kidney disease, stage 3b (HCC) 01/24/2022   Diabetic renal disease (HCC) 01/24/2022   Medication management 01/24/2022   Primary open-angle glaucoma, bilateral, mild stage 01/24/2022   Urinary urgency 09/18/2021   Essential hypertension 07/23/2021   Chest pain of uncertain etiology 07/23/2021   Mixed dyslipidemia 07/23/2021   Snake bite 07/22/2021   Raynaud's disease 07/22/2021   Recurrent depression (HCC) 07/22/2021   Osteopenia of lumbar spine 07/22/2021   Obesity, unspecified 07/22/2021   Irritable bowel syndrome with diarrhea 07/22/2021   Hyperlipidemia 07/22/2021   History of gastritis 07/22/2021   Hiatal hernia  07/22/2021   Hemangioma of intra-abdominal structure 07/22/2021   Family history of colonic polyps 07/22/2021   Fatty liver 07/22/2021   Fatigue 07/22/2021   Cataracts, bilateral 07/22/2021   Body mass index (BMI) 30.0-30.9, adult 07/22/2021   Anxiety 07/22/2021   Allergic rhinitis 07/22/2021   Vitamin D deficiency 07/22/2021   Hypoproteinemia (HCC) 07/22/2021   Hyperglycemia due to type 2 diabetes mellitus (HCC) 07/22/2021   Gastroesophageal reflux disease 07/22/2021   Anemia due to chronic blood loss 07/22/2021   Acute gastritis 07/22/2021   Problematic vaginal discharge 07/22/2021   Vulvitis 07/22/2021   Vitreous floaters of right eye 07/22/2021   Vaginal lesion 07/22/2021   Prediabetes 07/22/2021   Other allergy status, other than to drugs and biological substances 07/22/2021   Dysgeusia 12/18/2020   Anosmia 12/18/2020   Hypomagnesemia    Hypokalemia    Generalized abdominal pain 09/29/2020   Abdominal pain 09/29/2020   Enteritis 09/28/2020   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   Other vitreous opacities, right eye 09/11/2020   Posterior vitreous detachment of right eye 09/11/2020   Posterior vitreous  detachment of both eyes 09/11/2020   Shortness of breath 12/12/2019   Incontinence of feces with fecal urgency 02/24/2019   Dysfunctional voiding of urine 02/24/2019   Urge urinary incontinence 02/24/2019   S/P arthroscopy of left shoulder 04/07/2017   Angina pectoris (HCC) 01/28/2017   Neck pain 12/15/2016   Tear of left rotator cuff 12/15/2016   Chronic left shoulder pain 09/18/2016   Carpal tunnel syndrome of left wrist 06/03/2016   Trigger middle finger of left hand 01/24/2016   Type 2 diabetes mellitus with other specified complication (HCC) 05/11/2015   Family history of premature CAD 05/11/2015   Hypothyroidism 12/08/1996   Hypertensive disorder 12/08/1988    PCP: Darlis Loan, DO  REFERRING PROVIDER: Marsh Dolly, Julien Girt, PA-C  REFERRING  DIAG:  Diagnosis  C50.411,Z17.0 (ICD-10-CM) - Malignant neoplasm of upper-outer quadrant of right breast in female, estrogen receptor positive (HCC)    THERAPY DIAG:  Malignant neoplasm of upper-outer quadrant of right breast in female, estrogen receptor positive (HCC)  Aftercare following surgery for neoplasm  Seroma of breast  ONSET DATE: 12/24/23  Rationale for Evaluation and Treatment: Rehabilitation  SUBJECTIVE:                                                                                                                                                                                           SUBJECTIVE STATEMENT: It is getting much better already, but they want me to learn how to do the massage before starting treatment tomorrow.  I am wearing the compression bra.    PERTINENT HISTORY: Rt lumpectomy 01/15/24 with grade 1 IDC with DCIS. No lymph nodes removed.  Triple positive. Oncotype of 28 - chemo recommended but not declines. Radiation starts soon.   PAIN:  Are you having pain? No  PRECAUTIONS: None  RED FLAGS: None   WEIGHT BEARING RESTRICTIONS: No  FALLS:  Has patient fallen in last 6 months? No  LIVING ENVIRONMENT: Lives with: lives with their family and lives alone  OCCUPATION: retired   LEISURE: chair yoga, walking   HAND DOMINANCE: right   PRIOR LEVEL OF FUNCTION: Independent  PATIENT GOALS: learn the massage for the breast   OBJECTIVE: Note: Objective measures were completed at Evaluation unless otherwise noted.  COGNITION: Overall cognitive status: Within functional limits for tasks assessed   PALPATION: Seroma which does not feel very fluid filled under the Rt breast almost vertical lumpectomy incision. No overall general edema.  Scar tissue noted  OBSERVATIONS / OTHER ASSESSMENTS: wearing compression, no cording   POSTURE: rounded shoulders   UPPER EXTREMITY AROM/PROM: FULL bilateral   CERVICAL AROM: All within normal limits:  UPPER  EXTREMITY STRENGTH: WNL  LYMPHEDEMA ASSESSMENTS: No risk due to no node removal  BREAST COMPLAINTS QUESTIONNAIRE     Pain:   0 Heaviness:  0 Swollen feeling: 1 Tense Skin:  0 Redness:  0 Bra Print:  0 Size of Pores:  0 Hard feeling:   1 Total:    2 /80 A Score over 9 indicates lymphedema issues in the breast                                                                                                                            TREATMENT DATE:  03/02/24 Eval performed Supine: education on lymphatic anatomy and physiology especially with an intact system.  Gave pt modified instructions per instruction section to include: clavicular nodes, 5 deep breaths, Rt axilla, and then breast work towards the Rt axilla and clavicle.   PT read and demonstrated each step and then pt performed first in supine and then in seated Made foam rectangle for extra compression over seroma.      PATIENT EDUCATION:  Education details: per today's note Person educated: Patient Education method: Programmer, multimedia, Demonstration, Tactile cues, Verbal cues, and Handouts Education comprehension: verbalized understanding and returned demonstration  HOME EXERCISE PROGRAM: Self MLD  ASSESSMENT:  CLINICAL IMPRESSION: Patient is a 73 y.o. female who was seen today for physical therapy evaluation and treatment for her Rt breast seroma which has overall decreased greatly per patient.  There is no pain and she feels like her ROM and strength are normal.  She starts radiation tomorrow and would like to learn self massage.  Discussed scar massage followed by modified MLD per above.  Pt did well with education on this and should be ind with self care.  .    OBJECTIVE IMPAIRMENTS: decreased knowledge of condition and increased fascial restrictions.   ACTIVITY LIMITATIONS: none  PARTICIPATION LIMITATIONS: none  PERSONAL FACTORS: 1-2 comorbidities: scar tissue/ location  are also affecting patient's functional outcome.    REHAB POTENTIAL: Excellent  CLINICAL DECISION MAKING: Stable/uncomplicated  EVALUATION COMPLEXITY: Low  GOALS: Goals reviewed with patient? Yes  SHORT TERM GOALS: Target date: 03/02/24  Pt will be ind with massage sequence for the Rt breast for an intact lymphatic system.  Baseline: Goal status: MET   PLAN:  PT FREQUENCY: one time visit  PT DURATION: 1 week  PLANNED INTERVENTIONS: 97535- Self Care, [id number]- Manual therapy, Patient/Family education, Balance training, Joint mobilization, Therapeutic exercises, Therapeutic activity, Neuromuscular re-education, Gait training, and Self Care  PLAN FOR NEXT SESSION: as needed   Idamae Lusher, PT 03/02/2024, 8:05 PM

## 2024-03-02 ENCOUNTER — Other Ambulatory Visit: Payer: Self-pay

## 2024-03-02 ENCOUNTER — Encounter: Payer: Self-pay | Admitting: Rehabilitation

## 2024-03-02 ENCOUNTER — Ambulatory Visit: Attending: Radiation Oncology | Admitting: Rehabilitation

## 2024-03-02 DIAGNOSIS — C50411 Malignant neoplasm of upper-outer quadrant of right female breast: Secondary | ICD-10-CM | POA: Diagnosis present

## 2024-03-02 DIAGNOSIS — N6489 Other specified disorders of breast: Secondary | ICD-10-CM | POA: Insufficient documentation

## 2024-03-02 DIAGNOSIS — Z483 Aftercare following surgery for neoplasm: Secondary | ICD-10-CM | POA: Insufficient documentation

## 2024-03-02 DIAGNOSIS — Z170 Estrogen receptor positive status [ER+]: Secondary | ICD-10-CM | POA: Diagnosis present

## 2024-03-02 NOTE — Patient Instructions (Addendum)
 Self manual lymph drainage: Perform this sequence once a day.  Only give enough pressure no your skin to make the skin move.  1.)  10 circles at the Right collar bone  2.) 5 deep breaths. Inhale through nose making navel move out toward hands. Exhale through puckered lips, hands follow navel in.  Diaphragmatic - Supine     3.) 10 circles in the Rt armpit     4.) Then work the breast towards the Rt armpit and collarbones.   5.) Repeat the steps above where you do circles in your left groin and right armpit. Copyright  [employer]. All rights reserved.

## 2024-03-03 ENCOUNTER — Ambulatory Visit
Admission: RE | Admit: 2024-03-03 | Discharge: 2024-03-03 | Disposition: A | Discharge status: Home / Self Care | Source: Ambulatory Visit | Attending: Radiation Oncology | Admitting: Radiation Oncology

## 2024-03-03 ENCOUNTER — Other Ambulatory Visit: Payer: Self-pay

## 2024-03-03 DIAGNOSIS — Z510 Encounter for antineoplastic radiation therapy: Secondary | ICD-10-CM | POA: Diagnosis not present

## 2024-03-03 LAB — RAD ONC ARIA SESSION SUMMARY
Course Elapsed Days: 0
Plan Fractions Treated to Date: 1
Plan Prescribed Dose Per Fraction: 5.7 Gy
Plan Total Fractions Prescribed: 5
Plan Total Prescribed Dose: 28.5 Gy
Reference Point Dosage Given to Date: 5.7 Gy
Reference Point Session Dosage Given: 5.7 Gy
Session Number: 1

## 2024-03-05 ENCOUNTER — Telehealth: Payer: Self-pay | Admitting: Hematology and Oncology

## 2024-03-05 NOTE — Telephone Encounter (Signed)
 Spoke with patient confirming upcoming appointment

## 2024-03-10 ENCOUNTER — Ambulatory Visit
Admission: RE | Admit: 2024-03-10 | Discharge: 2024-03-10 | Disposition: A | Discharge status: Home / Self Care | Source: Ambulatory Visit | Attending: Radiation Oncology | Admitting: Radiation Oncology

## 2024-03-10 ENCOUNTER — Other Ambulatory Visit: Payer: Self-pay

## 2024-03-10 DIAGNOSIS — C50411 Malignant neoplasm of upper-outer quadrant of right female breast: Secondary | ICD-10-CM | POA: Insufficient documentation

## 2024-03-10 DIAGNOSIS — Z510 Encounter for antineoplastic radiation therapy: Secondary | ICD-10-CM | POA: Insufficient documentation

## 2024-03-10 LAB — RAD ONC ARIA SESSION SUMMARY
Course Elapsed Days: 7
Plan Fractions Treated to Date: 2
Plan Prescribed Dose Per Fraction: 5.7 Gy
Plan Total Fractions Prescribed: 5
Plan Total Prescribed Dose: 28.5 Gy
Reference Point Dosage Given to Date: 11.4 Gy
Reference Point Session Dosage Given: 5.7 Gy
Session Number: 2

## 2024-03-17 ENCOUNTER — Other Ambulatory Visit: Payer: Self-pay

## 2024-03-17 ENCOUNTER — Ambulatory Visit
Admission: RE | Admit: 2024-03-17 | Discharge: 2024-03-17 | Discharge status: Home / Self Care | Source: Ambulatory Visit | Attending: Radiation Oncology

## 2024-03-17 DIAGNOSIS — Z510 Encounter for antineoplastic radiation therapy: Secondary | ICD-10-CM | POA: Diagnosis not present

## 2024-03-17 LAB — RAD ONC ARIA SESSION SUMMARY
Course Elapsed Days: 14
Plan Fractions Treated to Date: 3
Plan Prescribed Dose Per Fraction: 5.7 Gy
Plan Total Fractions Prescribed: 5
Plan Total Prescribed Dose: 28.5 Gy
Reference Point Dosage Given to Date: 17.1 Gy
Reference Point Session Dosage Given: 5.7 Gy
Session Number: 3

## 2024-03-24 ENCOUNTER — Ambulatory Visit
Admission: RE | Admit: 2024-03-24 | Discharge: 2024-03-24 | Disposition: A | Discharge status: Home / Self Care | Source: Ambulatory Visit | Attending: Radiation Oncology | Admitting: Radiation Oncology

## 2024-03-24 ENCOUNTER — Other Ambulatory Visit: Payer: Self-pay

## 2024-03-24 DIAGNOSIS — Z510 Encounter for antineoplastic radiation therapy: Secondary | ICD-10-CM | POA: Diagnosis not present

## 2024-03-24 LAB — RAD ONC ARIA SESSION SUMMARY
Course Elapsed Days: 21
Plan Fractions Treated to Date: 4
Plan Prescribed Dose Per Fraction: 5.7 Gy
Plan Total Fractions Prescribed: 5
Plan Total Prescribed Dose: 28.5 Gy
Reference Point Dosage Given to Date: 22.8 Gy
Reference Point Session Dosage Given: 5.7 Gy
Session Number: 4

## 2024-03-31 ENCOUNTER — Other Ambulatory Visit: Payer: Self-pay

## 2024-03-31 ENCOUNTER — Ambulatory Visit
Admission: RE | Admit: 2024-03-31 | Discharge: 2024-03-31 | Disposition: A | Discharge status: Home / Self Care | Source: Ambulatory Visit | Attending: Radiation Oncology | Admitting: Radiation Oncology

## 2024-03-31 DIAGNOSIS — Z510 Encounter for antineoplastic radiation therapy: Secondary | ICD-10-CM | POA: Diagnosis not present

## 2024-03-31 LAB — RAD ONC ARIA SESSION SUMMARY
Course Elapsed Days: 28
Plan Fractions Treated to Date: 5
Plan Prescribed Dose Per Fraction: 5.7 Gy
Plan Total Fractions Prescribed: 5
Plan Total Prescribed Dose: 28.5 Gy
Reference Point Dosage Given to Date: 28.5 Gy
Reference Point Session Dosage Given: 5.7 Gy
Session Number: 5

## 2024-04-01 NOTE — Radiation Completion Notes (Addendum)
  Radiation Oncology         [phone] ________________________________  Name: Alexis Molina MRN: [id number]  Date of Service: 03/31/2024  DOB: 1951-04-01  End of Treatment Note    Diagnosis: Stage IA, pT1cN0M0, grade 1, ER/PR positive invasive ductal carcinoma of the right breast.   Intent: Curative     ==========DELIVERED PLANS==========  First Treatment Date: 2024-03-03 Last Treatment Date: 2024-03-31   Plan Name: Breast_R_UHRT Site: Breast, Right Technique: 3D Mode: Photon Dose Per Fraction: 5.7 Gy Prescribed Dose (Delivered / Prescribed): 28.5 Gy / 28.5 Gy Prescribed Fxs (Delivered / Prescribed): 5 / 5     ==========ON TREATMENT VISIT DATES========== 2024-03-31    See weekly On Treatment Notes in Epic for details in the Media tab (listed as Progress notes on the On Treatment Visit Dates listed above). The patient tolerated radiation. She developed fatigue and anticipated skin changes in the treatment field.   The patient will receive a call in about one month from the radiation oncology department. She will continue follow up with Dr. Gudena as well.      Shelvia Dick, PAC

## 2024-04-04 ENCOUNTER — Inpatient Hospital Stay: Attending: Hematology and Oncology | Admitting: Hematology and Oncology

## 2024-04-04 VITALS — BP 110/58 | HR 64 | Temp 98.0°F | Resp 18 | Ht 62.0 in | Wt 153.6 lb

## 2024-04-04 DIAGNOSIS — Z170 Estrogen receptor positive status [ER+]: Secondary | ICD-10-CM | POA: Insufficient documentation

## 2024-04-04 DIAGNOSIS — Z780 Asymptomatic menopausal state: Secondary | ICD-10-CM | POA: Diagnosis not present

## 2024-04-04 DIAGNOSIS — C50411 Malignant neoplasm of upper-outer quadrant of right female breast: Secondary | ICD-10-CM | POA: Insufficient documentation

## 2024-04-04 MED ORDER — ANASTROZOLE 1 MG PO TABS: 1.0000 mg | ORAL_TABLET | Freq: Every day | ORAL | 3 refills | Status: DC

## 2024-04-04 NOTE — Progress Notes (Signed)
 Patient Care Team: Ishmael Marcus, DO as PCP - General (Family Medicine) Lucendia Rusk, MD as PCP - Cardiology (Cardiology) Auther Bo, RN as Oncology Nurse Navigator Alane Hsu, RN as Oncology Nurse Navigator Cameron Cea, MD as Consulting Physician (Hematology and Oncology)  DIAGNOSIS:  Encounter Diagnoses  Name Primary?   Malignant neoplasm of upper-outer quadrant of right breast in female, estrogen receptor positive (HCC)    Post-menopausal Yes    SUMMARY OF ONCOLOGIC HISTORY: Oncology History  Malignant neoplasm of upper-outer quadrant of right breast in female, estrogen receptor positive (HCC)  12/24/2023 Initial Diagnosis   Screening mammogram detected right upper outer quadrant mass 1 cm by ultrasound at 10 o'clock position.  Axilla negative.  Biopsy: Grade 1 IDC ER 100%, PR 100%, Ki67 10%, HER2 negative by [facility]   01/04/2024 Cancer Staging   Staging form: Breast, AJCC 8th Edition - Clinical stage from 01/04/2024: Stage IA (cT1b, cN0, cM0, G1, ER+, PR+, HER2-) - Signed by Bettejane Brownie, PA-C on 01/04/2024 Stage prefix: Initial diagnosis Method of lymph node assessment: Clinical Histologic grading system: 3 grade system   01/15/2024 Surgery   Right lumpectomy: 1.6 cm grade 1 IDC with focal high-grade DCIS, margins negative, ER 100%, PR 100%, HER2 1+, Ki-67 10%   01/20/2024 Genetic Testing   Negative [facility] CancerNext-Expanded +RNAinsight Panel.  Report date is 01/20/2024.  The CancerNext-Expanded gene panel offered by [facility] and includes sequencing, rearrangement, and RNA analysis for the following 76 genes: AIP, ALK, APC, ATM, AXIN2, BAP1, BARD1, BMPR1A, BRCA1, BRCA2, BRIP1, CDC73, CDH1, CDK4, CDKN1B, CDKN2A, CEBPA, CHEK2, CTNNA1, DDX41, DICER1, ETV6, FH, FLCN, GATA2, LZTR1, MAX, MBD4, MEN1, MET, MLH1, MSH2, MSH3, MSH6, MUTYH, NF1, NF2, NTHL1, PALB2, PHOX2B, PMS2, POT1, PRKAR1A, PTCH1, PTEN, RAD51C, RAD51D, RB1, RET, RUNX1, SDHA, SDHAF2, SDHB,  SDHC, SDHD, SMAD4, SMARCA4, SMARCB1, SMARCE1, STK11, SUFU, TMEM127, TP53, TSC1, TSC2, VHL, and WT1 (sequencing and deletion/duplication); EGFR, HOXB13, KIT, MITF, PDGFRA, POLD1, and POLE (sequencing only); EPCAM and GREM1 (deletion/duplication only).    01/27/2024 Oncotype testing   Oncotype DX: 28 (distant recurrence at 9 years: 17%)     CHIEF COMPLIANT: Follow-up after radiation therapy is completed  HISTORY OF PRESENT ILLNESS: History of Present Illness Renesmay, a patient with a history of breast cancer, presents for a follow-up visit after completing radiation therapy. She reports experiencing burning sensations due to the radiation therapy. She expresses concerns about potential side effects, particularly on her stomach. She also mentions experiencing hot flashes recently, which she attributes to the radiation therapy.  In addition to her cancer treatment, Tyliah also discusses her physical activity, which includes using a stepper while watching TV and walking once a day. She also mentions a recent birthday celebration and her family's involvement in her diet and lifestyle changes.  Leronda also mentions that she is due for a bone density test, which she has previously done at a different location.     ALLERGIES:  is allergic to darvocet [propoxyphene n-acetaminophen ], orange fruit [citrus], vicodin [hydrocodone -acetaminophen ], dust mite extract, betamethasone, celecoxib, morphine , oxycodone-acetaminophen , wound dressing adhesive, glimepiride, penicillins, and sulfa antibiotics.  MEDICATIONS:  Current Outpatient Medications  Medication Sig Dispense Refill   acetaminophen  (TYLENOL ) 500 MG tablet Take 500 mg by mouth as needed for mild pain (pain score 1-3) or moderate pain (pain score 4-6).     albuterol (VENTOLIN HFA) 108 (90 Base) MCG/ACT inhaler Inhale 2 puffs into the lungs every 4 (four) hours as needed for wheezing or shortness of breath.  anastrozole (ARIMIDEX) 1 MG tablet  Take 1 tablet (1 mg total) by mouth daily. 90 tablet 3   ketorolac  (ACULAR ) 0.5 % ophthalmic solution INSTILL 1 DROP INTO LEFT EYE 4 TIMES DAILY 5 mL 0   latanoprost (XALATAN) 0.005 % ophthalmic solution Place 1 drop into both eyes daily.     levothyroxine  (SYNTHROID ) 50 MCG tablet Take 50 mcg by mouth daily.     lisinopril  (ZESTRIL ) 10 MG tablet Take 10 mg by mouth daily.     Multiple Vitamin (MULTIVITAMIN WITH MINERALS) TABS tablet Take 1 tablet by mouth every other day. Gummy bears/ Unknown strength     pantoprazole  (PROTONIX ) 40 MG tablet Take 1 tablet by mouth daily.     rosuvastatin  (CRESTOR ) 20 MG tablet Take 20 mg by mouth daily.     sertraline (ZOLOFT) 25 MG tablet Take 1 tablet by mouth daily.     timolol  (BETIMOL ) 0.25 % ophthalmic solution 1-2 drops 2 (two) times daily.     triamterene -hydrochlorothiazide (MAXZIDE) 75-50 MG tablet Take 1 tablet by mouth daily.     diphenhydrAMINE  (BENADRYL ) 25 MG tablet Take 25 mg by mouth every 6 (six) hours as needed for itching. (Patient not taking: Reported on 04/04/2024)     nitroGLYCERIN  (NITROSTAT ) 0.4 MG SL tablet Place 0.4 mg under the tongue every 5 (five) minutes as needed for chest pain. (Patient not taking: Reported on 04/04/2024)     traMADol  (ULTRAM ) 50 MG tablet Take 1 tablet (50 mg total) by mouth every 6 (six) hours as needed. (Patient not taking: Reported on 04/04/2024) 20 tablet 0   No current facility-administered medications for this visit.    PHYSICAL EXAMINATION: ECOG PERFORMANCE STATUS: 1 - Symptomatic but completely ambulatory  Vitals:   04/04/24 1400  BP: (!) 110/58  Pulse: 64  Resp: 18  Temp: 98 F (36.7 C)  SpO2: 100%   Filed Weights   04/04/24 1400  Weight: 153 lb 9.6 oz (69.7 kg)     LABORATORY DATA:  I have reviewed the data as listed    Latest Ref Rng & Units 01/12/2024    3:06 PM 01/06/2024   12:26 PM 04/10/2023   10:24 AM  CMP  Glucose 70 - 99 mg/dL 93  045  409   BUN 8 - 23 mg/dL 7  8  13     Creatinine 0.44 - 1.00 mg/dL 8.11  9.14  7.82   Sodium 135 - 145 mmol/L 135  135  145   Potassium 3.5 - 5.1 mmol/L 3.9  3.3  4.2   Chloride 98 - 111 mmol/L 98  97  105   CO2 22 - 32 mmol/L 28  29  24    Calcium  8.9 - 10.3 mg/dL 8.9  9.9  9.3   Total Protein 6.5 - 8.1 g/dL  7.6    Total Bilirubin 0.0 - 1.2 mg/dL  1.1    Alkaline Phos 38 - 126 U/L  53    AST 15 - 41 U/L  24    ALT 0 - 44 U/L  18      Lab Results  Component Value Date   WBC 5.1 01/06/2024   HGB 13.1 01/06/2024   HCT 38.2 01/06/2024   MCV 79.7 (L) 01/06/2024   PLT 257 01/06/2024   NEUTROABS 4.0 01/06/2024    ASSESSMENT & PLAN:  Malignant neoplasm of upper-outer quadrant of right breast in female, estrogen receptor positive (HCC) 12/24/2023:Screening mammogram detected right upper outer quadrant mass 1 cm by ultrasound  at 10 o'clock position.  Axilla negative.  Biopsy: Grade 1 IDC ER 100%, PR 100%, Ki67 10%, HER2 negative by [facility]    01/15/2024: Right lumpectomy: 1.6 cm grade 1 IDC with focal high-grade DCIS, margins negative, ER 100%, PR 100%, HER2 1+, Ki-67 10% Oncotype DX: 28 (distant recurrence at 9 years: 17%)   Recommendations: 1.  Adjuvant chemotherapy with Taxotere and Cytoxan every 3 weeks x 4 cycles: Patient declined 2. Adjuvant radiation therapy completed 03/31/2024 3. Adjuvant antiestrogen therapy with anastrozole 1 mg daily started 04/07/2024 ------------------------------------------------------------------------------------------------------- Anastrozole counseling: We discussed the risks and benefits of anti-estrogen therapy with aromatase inhibitors. These include but not limited to insomnia, hot flashes, mood changes, vaginal dryness, bone density loss, and weight gain. We strongly believe that the benefits far outweigh the risks. Patient understands these risks and consented to starting treatment. Planned treatment duration is 7 years.  Return to clinic for survivorship care plan  visit  ------------------------------------- Assessment and Plan Assessment & Plan Malignant neoplasm of upper-outer quadrant of right breast, estrogen receptor positive Completed radiation therapy. Discussed anastrozole initiation to reduce estrogen production and prevent recurrence. Explained potential side effects and emphasized bone health monitoring due to osteoporosis risk. Encouraged vitamin D supplementation. - Prescribe anastrozole 1 mg daily for 5-7 years. - Order bone density test in the next month at [facility]. - Encourage vitamin D supplementation, 2000 IU daily.      Orders Placed This Encounter  Procedures   DG Bone Density    Standing Status:   Future    Expected Date:   05/04/2024    Expiration Date:   04/04/2025    Reason for Exam (SYMPTOM  OR DIAGNOSIS REQUIRED):   Post menopausal    Preferred imaging location?:   [facility]    Release to patient:   Immediate   The patient has a good understanding of the overall plan. she agrees with it. she will call with any problems that may develop before the next visit here. Total time spent: 30 mins including face to face time and time spent for planning, charting and co-ordination of care   Margert Sheerer, MD 04/04/24

## 2024-04-04 NOTE — Assessment & Plan Note (Addendum)
 12/24/2023:Screening mammogram detected right upper outer quadrant mass 1 cm by ultrasound at 10 o'clock position.  Axilla negative.  Biopsy: Grade 1 IDC ER 100%, PR 100%, Ki67 10%, HER2 negative by [facility]    01/15/2024: Right lumpectomy: 1.6 cm grade 1 IDC with focal high-grade DCIS, margins negative, ER 100%, PR 100%, HER2 1+, Ki-67 10% Oncotype DX: 28 (distant recurrence at 9 years: 17%)   Recommendations: 1.  Adjuvant chemotherapy with Taxotere and Cytoxan every 3 weeks x 4 cycles: Patient declined 2. Adjuvant radiation therapy completed 03/31/2024 3. Adjuvant antiestrogen therapy with anastrozole 1 mg daily started 04/07/2024 ------------------------------------------------------------------------------------------------------- Anastrozole counseling: We discussed the risks and benefits of anti-estrogen therapy with aromatase inhibitors. These include but not limited to insomnia, hot flashes, mood changes, vaginal dryness, bone density loss, and weight gain. We strongly believe that the benefits far outweigh the risks. Patient understands these risks and consented to starting treatment. Planned treatment duration is 7 years.  Return to clinic for survivorship care plan visit

## 2024-04-06 ENCOUNTER — Telehealth: Payer: Self-pay | Admitting: Radiation Oncology

## 2024-04-06 NOTE — Telephone Encounter (Signed)
 I spoke with the patient and she is doing well despite anticipated skin changes and fatigue from her recent ultrahypofractionated radiotherapy. She gives our department permission to discuss her case in ASTRO related presentations.

## 2024-04-22 NOTE — Progress Notes (Addendum)
  Radiation Oncology         [phone] ________________________________  Name: Alexis Molina MRN: [id number]  Date of Service: 04/22/2024  DOB: 28-May-1951  Post Treatment Telephone Note  Diagnosis:  Stage IA, pT1cN0M0, grade 1, ER/PR positive invasive ductal carcinoma of the right breast. (as documented in provider EOT note)  The patient was available for call today.   Symptoms of fatigue have improved since completing therapy.  Symptoms of skin changes have improved since completing therapy.  The patient was encouraged to avoid sun exposure in the area of prior treatment for up to one year following radiation with either sunscreen or by the style of clothing worn in the sun.  The patient has scheduled follow up with her medical oncologist Dr. Gudena for ongoing surveillance, and was encouraged to call if she develops concerns or questions regarding radiation.   This concludes the interaction.  Avery Bodo, LPN

## 2024-04-25 ENCOUNTER — Inpatient Hospital Stay
Admission: RE | Admit: 2024-04-25 | Discharge: 2024-04-25 | Disposition: A | Discharge status: Home / Self Care | Source: Ambulatory Visit

## 2024-04-25 ENCOUNTER — Ambulatory Visit

## 2024-04-25 DIAGNOSIS — Z780 Asymptomatic menopausal state: Secondary | ICD-10-CM | POA: Diagnosis not present

## 2024-04-25 DIAGNOSIS — Z13820 Encounter for screening for osteoporosis: Secondary | ICD-10-CM | POA: Diagnosis not present

## 2024-04-25 DIAGNOSIS — M8580 Other specified disorders of bone density and structure, unspecified site: Secondary | ICD-10-CM

## 2024-05-05 ENCOUNTER — Other Ambulatory Visit (HOSPITAL_BASED_OUTPATIENT_CLINIC_OR_DEPARTMENT_OTHER)

## 2024-07-06 ENCOUNTER — Other Ambulatory Visit: Payer: Self-pay | Admitting: *Deleted

## 2024-07-06 DIAGNOSIS — Z170 Estrogen receptor positive status [ER+]: Secondary | ICD-10-CM

## 2024-07-07 ENCOUNTER — Encounter: Payer: Self-pay | Admitting: Adult Health

## 2024-07-07 ENCOUNTER — Telehealth: Payer: Self-pay

## 2024-07-07 ENCOUNTER — Inpatient Hospital Stay: Attending: Adult Health | Admitting: Adult Health

## 2024-07-07 ENCOUNTER — Inpatient Hospital Stay

## 2024-07-07 VITALS — BP 114/54 | HR 58 | Temp 98.1°F | Resp 17 | Ht 62.0 in | Wt 178.3 lb

## 2024-07-07 DIAGNOSIS — Z170 Estrogen receptor positive status [ER+]: Secondary | ICD-10-CM | POA: Insufficient documentation

## 2024-07-07 DIAGNOSIS — C50411 Malignant neoplasm of upper-outer quadrant of right female breast: Secondary | ICD-10-CM | POA: Insufficient documentation

## 2024-07-07 DIAGNOSIS — R232 Flushing: Secondary | ICD-10-CM | POA: Diagnosis not present

## 2024-07-07 DIAGNOSIS — Z8041 Family history of malignant neoplasm of ovary: Secondary | ICD-10-CM | POA: Insufficient documentation

## 2024-07-07 DIAGNOSIS — Z79811 Long term (current) use of aromatase inhibitors: Secondary | ICD-10-CM | POA: Insufficient documentation

## 2024-07-07 DIAGNOSIS — Z923 Personal history of irradiation: Secondary | ICD-10-CM | POA: Diagnosis not present

## 2024-07-07 DIAGNOSIS — Z801 Family history of malignant neoplasm of trachea, bronchus and lung: Secondary | ICD-10-CM | POA: Diagnosis not present

## 2024-07-07 DIAGNOSIS — R5383 Other fatigue: Secondary | ICD-10-CM | POA: Diagnosis not present

## 2024-07-07 DIAGNOSIS — Z87891 Personal history of nicotine dependence: Secondary | ICD-10-CM | POA: Insufficient documentation

## 2024-07-07 LAB — CMP (CANCER CENTER ONLY)
ALT: 16 U/L (ref 0–44)
AST: 20 U/L (ref 15–41)
Albumin: 4 g/dL (ref 3.5–5.0)
Alkaline Phosphatase: 50 U/L (ref 38–126)
Anion gap: 4 — ABNORMAL LOW (ref 5–15)
BUN: 15 mg/dL (ref 8–23)
CO2: 33 mmol/L — ABNORMAL HIGH (ref 22–32)
Calcium: 9.5 mg/dL (ref 8.9–10.3)
Chloride: 102 mmol/L (ref 98–111)
Creatinine: 1.1 mg/dL — ABNORMAL HIGH (ref 0.44–1.00)
GFR, Estimated: 53 mL/min — ABNORMAL LOW (ref >60)
Glucose, Bld: 84 mg/dL (ref 70–99)
Potassium: 3.8 mmol/L (ref 3.5–5.1)
Sodium: 139 mmol/L (ref 135–145)
Total Bilirubin: 0.9 mg/dL (ref 0.0–1.2)
Total Protein: 7.2 g/dL (ref 6.5–8.1)

## 2024-07-07 LAB — CBC WITH DIFFERENTIAL (CANCER CENTER ONLY)
Abs Immature Granulocytes: 0.01 K/uL (ref 0.00–0.07)
Basophils Absolute: 0 K/uL (ref 0.0–0.1)
Basophils Relative: 1 %
Eosinophils Absolute: 0.1 K/uL (ref 0.0–0.5)
Eosinophils Relative: 2 %
HCT: 36.1 % (ref 36.0–46.0)
Hemoglobin: 12.5 g/dL (ref 12.0–15.0)
Immature Granulocytes: 0 %
Lymphocytes Relative: 17 %
Lymphs Abs: 0.7 K/uL (ref 0.7–4.0)
MCH: 27.7 pg (ref 26.0–34.0)
MCHC: 34.6 g/dL (ref 30.0–36.0)
MCV: 80 fL (ref 80.0–100.0)
Monocytes Absolute: 0.4 K/uL (ref 0.1–1.0)
Monocytes Relative: 8 %
Neutro Abs: 3 K/uL (ref 1.7–7.7)
Neutrophils Relative %: 72 %
Platelet Count: 189 K/uL (ref 150–400)
RBC: 4.51 MIL/uL (ref 3.87–5.11)
RDW: 14.1 % (ref 11.5–15.5)
WBC Count: 4.2 K/uL (ref 4.0–10.5)
nRBC: 0 % (ref 0.0–0.2)

## 2024-07-07 NOTE — Progress Notes (Unsigned)
 SURVIVORSHIP VISIT:  BRIEF ONCOLOGIC HISTORY:  Oncology History  Malignant neoplasm of upper-outer quadrant of right breast in female, estrogen receptor positive (HCC)  12/24/2023 Initial Diagnosis   Screening mammogram detected right upper outer quadrant mass 1 cm by ultrasound at 10 o'clock position.  Axilla negative.  Biopsy: Grade 1 IDC ER 100%, PR 100%, Ki67 10%, HER2 negative by IHC   01/04/2024 Cancer Staging   Staging form: Breast, AJCC 8th Edition - Clinical stage from 01/04/2024: Stage IA (cT1b, cN0, cM0, G1, ER+, PR+, HER2-) - Signed by Lanell Donald Stagger, PA-C on 01/04/2024 Stage prefix: Initial diagnosis Method of lymph node assessment: Clinical Histologic grading system: 3 grade system   01/15/2024 Surgery   Right lumpectomy: 1.6 cm grade 1 IDC with focal high-grade DCIS, margins negative, ER 100%, PR 100%, HER2 1+, Ki-67 10%   01/20/2024 Genetic Testing   Negative [facility] CancerNext-Expanded +RNAinsight Panel.  Report date is 01/20/2024.  The CancerNext-Expanded gene panel offered by [facility] and includes sequencing, rearrangement, and RNA analysis for the following 76 genes: AIP, ALK, APC, ATM, AXIN2, BAP1, BARD1, BMPR1A, BRCA1, BRCA2, BRIP1, CDC73, CDH1, CDK4, CDKN1B, CDKN2A, CEBPA, CHEK2, CTNNA1, DDX41, DICER1, ETV6, FH, FLCN, GATA2, LZTR1, MAX, MBD4, MEN1, MET, MLH1, MSH2, MSH3, MSH6, MUTYH, NF1, NF2, NTHL1, PALB2, PHOX2B, PMS2, POT1, PRKAR1A, PTCH1, PTEN, RAD51C, RAD51D, RB1, RET, RUNX1, SDHA, SDHAF2, SDHB, SDHC, SDHD, SMAD4, SMARCA4, SMARCB1, SMARCE1, STK11, SUFU, TMEM127, TP53, TSC1, TSC2, VHL, and WT1 (sequencing and deletion/duplication); EGFR, HOXB13, KIT, MITF, PDGFRA, POLD1, and POLE (sequencing only); EPCAM and GREM1 (deletion/duplication only).    01/27/2024 Oncotype testing   Oncotype DX: 28 (distant recurrence at 9 years: 17%)   03/03/2024 - 03/31/2024 Radiation Therapy   Plan Name: Breast_R_UHRT Site: Breast, Right Technique: 3D Mode: Photon Dose Per  Fraction: 5.7 Gy Prescribed Dose (Delivered / Prescribed): 28.5 Gy / 28.5 Gy Prescribed Fxs (Delivered / Prescribed): 5 / 5   03/2024 -  Anti-estrogen oral therapy   Anastrozole  x 7 years     INTERVAL HISTORY:  [name] to review her survivorship care plan detailing her treatment course for breast cancer, as well as monitoring long-term side effects of that treatment, education regarding health maintenance, screening, and overall wellness and health promotion.     Overall, [name] reports feeling quite well.  She is taking Anastrozole  daily and notes mild fatigue and hot flashes.   REVIEW OF SYSTEMS:  Review of Systems  Constitutional:  Positive for fatigue. Negative for appetite change, chills, fever and unexpected weight change.  HENT:   Negative for hearing loss, lump/mass and trouble swallowing.   Eyes:  Negative for eye problems and icterus.  Respiratory:  Negative for chest tightness, cough and shortness of breath.   Cardiovascular:  Negative for chest pain, leg swelling and palpitations.  Gastrointestinal:  Negative for abdominal distention, abdominal pain, constipation, diarrhea, nausea and vomiting.  Endocrine: Positive for hot flashes.  Genitourinary:  Negative for difficulty urinating.   Musculoskeletal:  Negative for arthralgias.  Skin:  Negative for itching and rash.  Neurological:  Negative for dizziness, extremity weakness, headaches and numbness.  Hematological:  Negative for adenopathy. Does not bruise/bleed easily.  Psychiatric/Behavioral:  Negative for depression. The patient is not nervous/anxious.   Breast: Denies any new nodularity, masses, tenderness, nipple changes, or nipple discharge.    PAST MEDICAL/SURGICAL HISTORY:  Past Medical History:  Diagnosis Date   Abdominal pain 09/29/2020   Acute gastritis 07/22/2021   Allergic rhinitis    Anemia due to  chronic blood loss 07/22/2021   Angina pectoris (HCC) 01/28/2017   Anosmia 12/18/2020   Anxiety     Aortic atherosclerosis (HCC) 04/01/2023   Body mass index (BMI) 30.0-30.9, adult 07/22/2021   Cancer (HCC) 12/2023   right breast IDC   Carpal tunnel syndrome of left wrist 06/03/2016   Cataracts, bilateral    Chest pain of uncertain etiology 07/23/2021   Chronic kidney disease, stage 3b (HCC) 01/24/2022   Chronic left shoulder pain 09/18/2016   Cystoid macular edema of left eye 05/19/2022   New onset   Diabetes mellitus without complication (HCC)    Diabetic renal disease (HCC) 01/24/2022   Dysfunctional voiding of urine 02/24/2019   Dysgeusia 12/18/2020   Enteritis 09/28/2020   Essential hypertension 07/23/2021   Family history of colonic polyps 07/22/2021   Family history of premature CAD 05/11/2015   Fatigue    Fatty liver    Gastroesophageal reflux disease 07/22/2021   Generalized abdominal pain 09/29/2020   Hemangioma of intra-abdominal structure 07/22/2021   Hiatal hernia    History of colonic polyps    History of gastritis 07/22/2021   History of vitrectomy 05/19/2022   Hyperglycemia due to type 2 diabetes mellitus (HCC) 07/22/2021   Hyperlipidemia    Hypertension    Hypertensive disorder 12/08/1988   Hypokalemia    Hypomagnesemia    Hypoproteinemia (HCC) 07/22/2021   Hypothyroidism    Idiopathic urticaria 03/31/2023   Incontinence of feces with fecal urgency 02/24/2019   Irritable bowel syndrome with diarrhea 07/22/2021   Left epiretinal membrane 05/19/2022   Malignant neoplasm of upper-outer quadrant of right breast in female, estrogen receptor positive (HCC) 01/04/2024   Medication management 01/24/2022   Mixed dyslipidemia 07/23/2021   Neck pain 12/15/2016   Obesity, unspecified    Osteopenia of lumbar spine 07/22/2021   Other allergy status, other than to drugs and biological substances 07/22/2021   Other vitreous opacities, right eye 09/11/2020   Peripheral edema    Peripheral edema    after knee surgery   Posterior vitreous detachment of both eyes  09/11/2020   Posterior vitreous detachment of right eye 09/11/2020   Prediabetes 07/22/2021   Primary open-angle glaucoma, bilateral, mild stage 01/24/2022   Problematic vaginal discharge 07/22/2021   Pruritus 03/31/2023   Raynaud's disease 07/22/2021   Raynaud's syndrome without gangrene    Recurrent depression (HCC) 07/22/2021   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   S/P arthroscopy of left shoulder 04/07/2017   Shortness of breath 12/12/2019   Snake bite    Subacute cough 03/31/2023   Tear of left rotator cuff 12/15/2016   Trigger middle finger of left hand 01/24/2016   Tubular adenoma of colon    Type 2 diabetes mellitus with other specified complication (HCC) 05/11/2015   no meds per patient   Urge urinary incontinence 02/24/2019   Urinary urgency 09/18/2021   Vaginal lesion 07/22/2021   Vitamin D deficiency    Vitreous floaters of right eye    Vulvitis 07/22/2021   Past Surgical History:  Procedure Laterality Date   ABDOMINAL HYSTERECTOMY     BIOPSY  10/02/2020   Procedure: BIOPSY;  Surgeon: Dianna Specking, MD;  Location: [facility];  Service: Endoscopy;;   BREAST LUMPECTOMY WITH RADIOACTIVE SEED LOCALIZATION Right 01/15/2024   Procedure: RIGHT BREAST LUMPECTOMY WITH RADIOACTIVE SEED LOCALIZATION;  Surgeon: Curvin Deward MOULD, MD;  Location: [facility];  Service: General;  Laterality: Right;   CARDIAC CATHETERIZATION N/A 07/30/2015   Procedure: Left Heart  Cath and Coronary Angiography;  Surgeon: Candyce GORMAN Reek, MD;  Location: [facility];  Service: Cardiovascular;  Laterality: N/A;   CARPAL TUNNEL RELEASE     CHOLECYSTECTOMY     ESOPHAGOGASTRODUODENOSCOPY (EGD) WITH PROPOFOL  N/A 10/02/2020   Procedure: ESOPHAGOGASTRODUODENOSCOPY (EGD) WITH PROPOFOL ;  Surgeon: Dianna Specking, MD;  Location: [facility];  Service: Endoscopy;  Laterality: N/A;   KNEE SURGERY     ROTATOR CUFF REPAIR Left    TUBAL LIGATION       ALLERGIES:   Allergies  Allergen Reactions   Darvocet [Propoxyphene N-Acetaminophen ] Other (See Comments)    Asthma attack   Orange Fruit [Citrus] Itching and Swelling   Vicodin [Hydrocodone -Acetaminophen ] Hives and Itching   Dust Mite Extract Itching   Betamethasone Other (See Comments)     w/Marcaine , itching   Celecoxib Other (See Comments)    abd pain     Morphine  Itching   Oxycodone-Acetaminophen  Other (See Comments)    Other reaction(s): itching, swelling   Wound Dressing Adhesive Other (See Comments)    Red raised areas beneath surgical drape adhesive and bovie pad   Glimepiride Itching   Penicillins Hives   Sulfa Antibiotics Hives     CURRENT MEDICATIONS:  Outpatient Encounter Medications as of 07/07/2024  Medication Sig   acetaminophen  (TYLENOL ) 500 MG tablet Take 500 mg by mouth as needed for mild pain (pain score 1-3) or moderate pain (pain score 4-6).   albuterol (VENTOLIN HFA) 108 (90 Base) MCG/ACT inhaler Inhale 2 puffs into the lungs every 4 (four) hours as needed for wheezing or shortness of breath.   anastrozole  (ARIMIDEX ) 1 MG tablet Take 1 tablet (1 mg total) by mouth daily.   cyclobenzaprine (FLEXERIL) 5 MG tablet Take 5 mg by mouth 3 (three) times daily as needed.   diphenhydrAMINE  (BENADRYL ) 25 MG tablet Take 25 mg by mouth every 6 (six) hours as needed for itching.   ketorolac  (ACULAR ) 0.5 % ophthalmic solution INSTILL 1 DROP INTO LEFT EYE 4 TIMES DAILY   latanoprost (XALATAN) 0.005 % ophthalmic solution Place 1 drop into both eyes daily.   levothyroxine  (SYNTHROID ) 50 MCG tablet Take 50 mcg by mouth daily.   lisinopril  (ZESTRIL ) 10 MG tablet Take 10 mg by mouth daily.   Multiple Vitamin (MULTIVITAMIN WITH MINERALS) TABS tablet Take 1 tablet by mouth every other day. Gummy bears/ Unknown strength   nitroGLYCERIN  (NITROSTAT ) 0.4 MG SL tablet Place 0.4 mg under the tongue every 5 (five) minutes as needed for chest pain.   pantoprazole  (PROTONIX ) 40 MG tablet Take 1  tablet by mouth daily.   rosuvastatin  (CRESTOR ) 20 MG tablet Take 20 mg by mouth daily.   sertraline (ZOLOFT) 25 MG tablet Take 1 tablet by mouth daily.   timolol  (BETIMOL ) 0.25 % ophthalmic solution 1-2 drops 2 (two) times daily.   traMADol  (ULTRAM ) 50 MG tablet Take 1 tablet (50 mg total) by mouth every 6 (six) hours as needed.   triamterene -hydrochlorothiazide (MAXZIDE) 75-50 MG tablet Take 1 tablet by mouth daily.   No facility-administered encounter medications on file as of 07/07/2024.     ONCOLOGIC FAMILY HISTORY:  Family History  Problem Relation Age of Onset   Alzheimer's disease Mother    Diabetes Mother    Hypertension Mother    Ulcers Father    Hypertension Father    Arthritis Father    Heart failure Father    Uterine cancer Sister 4       d. 4   Lung cancer  Brother 51       d. 44   Prostate cancer Paternal Uncle        d. 39; mets   Ovarian cancer Daughter        dx 30s     SOCIAL HISTORY:  Social History   Socioeconomic History   Marital status: Widowed    Spouse name: Not on file   Number of children: Not on file   Years of education: Not on file   Highest education level: Not on file  Occupational History   Not on file  Tobacco Use   Smoking status: Former    Current packs/day: 0.00    Types: Cigarettes    Quit date: 12/09/1987    Years since quitting: 36.6   Smokeless tobacco: Never  Vaping Use   Vaping status: Never Used  Substance and Sexual Activity   Alcohol use: No   Drug use: No   Sexual activity: Not Currently    Birth control/protection: Surgical    Comment: hyst  Other Topics Concern   Not on file  Social History Narrative   Not on file   Social Drivers of Health   Financial Resource Strain: Not on file  Food Insecurity: Low Risk  (03/25/2024)   Received from [facility]   Hunger Vital Sign    Within the past 12 months, you worried that your food would run out before you got money to buy more: Never true    Within the past  12 months, the food you bought just didn't last and you didn't have money to get more. : Never true  Transportation Needs: No Transportation Needs (03/25/2024)   Received from [facility]    In the past 12 months, has lack of reliable transportation kept you from medical appointments, meetings, work or from getting things needed for daily living? : No  Physical Activity: Not on file  Stress: Not on file  Social Connections: Not on file  Intimate Partner Violence: Not At Risk (02/11/2024)   Humiliation, Afraid, Rape, and Kick questionnaire    Fear of Current or Ex-Partner: No    Emotionally Abused: No    Physically Abused: No    Sexually Abused: No     OBSERVATIONS/OBJECTIVE:  BP (!) 114/54 (BP Location: Left Arm, Patient Position: Sitting)   Pulse (!) 58   Temp 98.1 F (36.7 C) (Temporal)   Resp 17   Ht 5' 2 (1.575 m)   Wt 178 lb 4.8 oz (80.9 kg)   SpO2 97%   BMI 32.61 kg/m  GENERAL: Patient is a well appearing female in no acute distress HEENT:  Sclerae anicteric.  Oropharynx clear and moist. No ulcerations or evidence of oropharyngeal candidiasis. Neck is supple.  NODES:  No cervical, supraclavicular, or axillary lymphadenopathy palpated.  BREAST EXAM: right breast s/p lumpectomy and radiation, no sign of local recurrence, left breast benign LUNGS:  Clear to auscultation bilaterally.  No wheezes or rhonchi. HEART:  Regular rate and rhythm. No murmur appreciated. ABDOMEN:  Soft, nontender.  Positive, normoactive bowel sounds. No organomegaly palpated. MSK:  No focal spinal tenderness to palpation. Full range of motion bilaterally in the upper extremities. EXTREMITIES:  No peripheral edema.   SKIN:  Clear with no obvious rashes or skin changes. No nail dyscrasia. NEURO:  Nonfocal. Well oriented.  Appropriate affect.   LABORATORY DATA:  None for this visit.  DIAGNOSTIC IMAGING:  None for this visit.      ASSESSMENT  AND PLAN:  Ms.. Alexis Molina is a pleasant  73 y.o. female with Stage IA right breast invasive ductal carcinoma, ER+/PR+/HER2-, diagnosed in 12/2023, treated with lumpectomy, adjuvant radiation therapy, and anti-estrogen therapy with Anastrozole  beginning in 03/2024.  She presents to the Survivorship Clinic for our initial meeting and routine follow-up post-completion of treatment for breast cancer.    1. Stage IA right breast cancer:  [name] is continuing to recover from definitive treatment for breast cancer. She will follow-up with her medical oncologist, Dr. Odean in 6 months with history and physical exam per surveillance protocol.  She will continue her anti-estrogen therapy with Anastrozole . Thus far, she is tolerating the Anastrozole  well, with minimal side effects. Her mammogram is due 12/2024; orders placed today.   Today, a comprehensive survivorship care plan and treatment summary was reviewed with the patient today detailing her breast cancer diagnosis, treatment course, potential late/long-term effects of treatment, appropriate follow-up care with recommendations for the future, and patient education resources.  A copy of this summary, along with a letter will be sent to the patient's primary care provider via mail/fax/In Basket message after today's visit.    2. Bone health:  Given [name] age/history of breast cancer and her current treatment regimen including anti-estrogen therapy with Anastrozole , she is at risk for bone demineralization.  Her last DEXA scan was 04/25/2024 and demonstrated osteopenia with a t score of -1.3 in the lumbar spine and left femur.  She was given education on specific activities to promote bone health.  3. Cancer screening:  Due to [name] history and her age, she should receive screening for skin cancers, colon cancer, and gynecologic cancers.  The information and recommendations are listed on the patient's comprehensive care plan/treatment summary and were reviewed in detail with the patient.     4. Health maintenance and wellness promotion: [name] was encouraged to consume 5-7 servings of fruits and vegetables per day. We reviewed the [employer] handout.  She was also encouraged to engage in moderate to vigorous exercise for 30 minutes per day most days of the week.  She was instructed to limit her alcohol consumption and continue to abstain from tobacco use.     5. Support services/counseling: It is not uncommon for this period of the patient's cancer care trajectory to be one of many emotions and stressors.   She was given information regarding our available services and encouraged to contact me with any questions or for help enrolling in any of our support group/programs.    Follow up instructions:    -Return to cancer center in 6 months for f/u with Dr. Odean  -Bone density 04/2026 -Mammogram due in 12/2024 -She is welcome to return back to the Survivorship Clinic at any time; no additional follow-up needed at this time.  -Consider referral back to survivorship as a long-term survivor for continued surveillance  The patient was provided an opportunity to ask questions and all were answered. The patient agreed with the plan and demonstrated an understanding of the instructions.   Total encounter time:40 minutes*in face-to-face visit time, chart review, lab review, care coordination, order entry, and documentation of the encounter time.    Alexis Kendall, NP 07/07/24 1:43 PM Medical Oncology and Hematology [facility] [address] [place], [place] [address] Tel. [phone]    Fax. [phone]  *Total Encounter Time as defined by the Centers for Medicare and Medicaid Services includes, in addition to the face-to-face time of a patient  visit (documented in the note above) non-face-to-face time: obtaining and reviewing outside history, ordering and reviewing medications, tests or procedures, care coordination (communications with other health care  professionals or caregivers) and documentation in the medical record.

## 2024-07-07 NOTE — Telephone Encounter (Signed)
 will you please fax mammo orders to [facility]? [facility]  Orders faxed confirmation received

## 2024-09-01 ENCOUNTER — Telehealth: Payer: Self-pay

## 2024-09-01 NOTE — Telephone Encounter (Signed)
 Pt called and c/o right axilla discomfort, intermittent pain, sometimes heat to the site and itching. She denies heat to the area right now and also denies a fever. She did not have LN bx.  Ms Alexis Molina was offered [facility] visit with our NP for 09/07/24 at 1020 and she agreed. Scheduled.

## 2024-09-06 ENCOUNTER — Ambulatory Visit (INDEPENDENT_AMBULATORY_CARE_PROVIDER_SITE_OTHER): Payer: Medicare HMO | Admitting: Otolaryngology

## 2024-09-06 VITALS — BP 106/69 | HR 61 | Temp 98.2°F | Ht 62.0 in | Wt 152.0 lb

## 2024-09-06 DIAGNOSIS — R0982 Postnasal drip: Secondary | ICD-10-CM

## 2024-09-06 DIAGNOSIS — J343 Hypertrophy of nasal turbinates: Secondary | ICD-10-CM | POA: Diagnosis not present

## 2024-09-06 DIAGNOSIS — H9319 Tinnitus, unspecified ear: Secondary | ICD-10-CM | POA: Diagnosis not present

## 2024-09-06 DIAGNOSIS — J310 Chronic rhinitis: Secondary | ICD-10-CM | POA: Diagnosis not present

## 2024-09-06 DIAGNOSIS — H608X2 Other otitis externa, left ear: Secondary | ICD-10-CM | POA: Diagnosis not present

## 2024-09-06 DIAGNOSIS — H9312 Tinnitus, left ear: Secondary | ICD-10-CM

## 2024-09-06 DIAGNOSIS — J342 Deviated nasal septum: Secondary | ICD-10-CM

## 2024-09-06 DIAGNOSIS — H903 Sensorineural hearing loss, bilateral: Secondary | ICD-10-CM | POA: Diagnosis not present

## 2024-09-06 DIAGNOSIS — H6123 Impacted cerumen, bilateral: Secondary | ICD-10-CM | POA: Diagnosis not present

## 2024-09-06 DIAGNOSIS — R0981 Nasal congestion: Secondary | ICD-10-CM | POA: Diagnosis not present

## 2024-09-06 MED ORDER — IPRATROPIUM BROMIDE 0.06 % NA SOLN: 2.0000 | Freq: Two times a day (BID) | NASAL | 12 refills | Status: DC | PRN

## 2024-09-06 NOTE — Progress Notes (Unsigned)
 Patient ID: Alexis Molina, female   DOB: Mar 29, 1951, 73 y.o.   MRN: [id number]  Follow-up: Left ear pain, left ear tinnitus, itchy sensation in the left ear, muffled hearing New complaint: Anosmia, chronic nasal congestion, postnasal drainage  HPI:  The patient is a 73 year old female who returns today for follow-up evaluation of her left ear pain and left ear tinnitus.  At her last visit 1 year ago ago, she was noted to have referred left otalgia, bilateral cerumen impaction and bilateral high frequency sensorineural hearing loss.  She was treated with disimpaction, Robaxin, and coping strategies for her tinnitus.  The patient returns today complaining of persistent left ear tinnitus, itchy sensation in her left ear canal, and occasional left ear pain.  She denies any otorrhea, vertigo, dysphagia, odynophagia or dyspnea. She denies any recent change in her hearing.  She has a new complaint today of chronic nasal congestion, anosmia, and postnasal drainage.  She has been symptomatic for many years.  She denies any facial pain or fever.  She has no previous ENT surgery.  Exam: General: Communicates without difficulty, well nourished, no acute distress. Head: Normocephalic, no evidence injury, no tenderness, facial buttresses intact without stepoff. Face/sinus: No tenderness to palpation and percussion. Facial movement is normal and symmetric. Eyes: PERRL, EOMI. No scleral icterus, conjunctivae clear. Neuro: CN II exam reveals vision grossly intact.  No nystagmus at any point of gaze. EAC: Bilateral cerumen impaction.  Under the operating microscope, the cerumen is carefully removed with a combination of cerumen currette, alligator forceps, and suction catheters.  After the cerumen is removed, the TMs are noted to be normal. Nose: External evaluation reveals normal support and skin without lesions.  Dorsum is intact.  Anterior rhinoscopy reveals congested mucosa over anterior aspect of inferior turbinates  and intact septum.  Oral:  Oral cavity and oropharynx are intact, symmetric, without erythema or edema.  Mucosa is moist without lesions. Neck: Full range of motion without pain.  There is no significant lymphadenopathy.  No masses palpable.  Thyroid  bed within normal limits to palpation.  Parotid glands and submandibular glands equal bilaterally without mass.  Trachea is midline. Neuro:  CN 2-12 grossly intact. Gait normal.   Procedure:  Flexible Nasal Endoscopy: Description: Risks, benefits, and alternatives of flexible endoscopy were explained to the patient.  Specific mention was made of the risk of throat numbness with difficulty swallowing, possible bleeding from the nose and mouth, and pain from the procedure.  The patient gave oral consent to proceed.  The flexible scope was inserted into the right nasal cavity.  Endoscopy of the interior nasal cavity, superior, inferior, and middle meatus was performed. The sphenoid-ethmoid recess was examined. Edematous mucosa was noted.  No polyp, mass, or lesion was appreciated. Nasal septal deviation noted. Olfactory cleft was clear.  Nasopharynx was clear.  Turbinates were hypertrophied but without mass.  The procedure was repeated on the contralateral side with similar findings.  The patient tolerated the procedure well.   Procedure: Bilateral cerumen disimpaction Anesthesia: None Description: Under the operating microscope, the cerumen is carefully removed with a combination of cerumen currette, alligator forceps, and suction catheters.  After the cerumen is removed, the TMs are noted to be normal.  Eczematous changes are noted within the left ear canal.  The patient tolerated the procedure well.   Assessment: 1.  Bilateral cerumen impaction.  After the disimpaction procedure, both tympanic membranes and middle ear spaces are noted to be normal.  2.  Chronic  eczematous left otitis externa.  Eczematous changes are noted within the left ear canal.   3.   Subjectively stable bilateral high frequency sensorineural hearing loss.   4.  Her tinnitus is likely a result of her hearing loss.  5.  Chronic rhinitis with nasal mucosal congestion, nasal septal deviation, bilateral inferior turbinate hypertrophy, and chronic postnasal drainage.  Plan: 1.  Otomicroscopy with bilateral cerumen disimpaction.  2.  The physical exam and nasal endoscopy findings are reviewed with the patient.  3.  The strategies to copy with her tinnitus are discussed.  4.  She is reassured that no infection is noted today.  5.  Elocon cream to treat the chronic eczematous otitis externa.  6.  Tylenol  as needed to treat her referred otalgia.  7.  Atrovent nasal spray to treat her chronic nasal drainage.  Nasal saline irrigation is encouraged. 8.  The patient will return for re-evaluation in 3 months.

## 2024-09-07 ENCOUNTER — Inpatient Hospital Stay: Attending: Adult Health | Admitting: Adult Health

## 2024-09-07 VITALS — BP 125/51 | HR 65 | Temp 97.3°F | Resp 16 | Wt 156.0 lb

## 2024-09-07 DIAGNOSIS — Z923 Personal history of irradiation: Secondary | ICD-10-CM | POA: Diagnosis not present

## 2024-09-07 DIAGNOSIS — J310 Chronic rhinitis: Secondary | ICD-10-CM | POA: Insufficient documentation

## 2024-09-07 DIAGNOSIS — R232 Flushing: Secondary | ICD-10-CM | POA: Insufficient documentation

## 2024-09-07 DIAGNOSIS — C50411 Malignant neoplasm of upper-outer quadrant of right female breast: Secondary | ICD-10-CM | POA: Insufficient documentation

## 2024-09-07 DIAGNOSIS — Z801 Family history of malignant neoplasm of trachea, bronchus and lung: Secondary | ICD-10-CM | POA: Insufficient documentation

## 2024-09-07 DIAGNOSIS — Z87891 Personal history of nicotine dependence: Secondary | ICD-10-CM | POA: Insufficient documentation

## 2024-09-07 DIAGNOSIS — Z8041 Family history of malignant neoplasm of ovary: Secondary | ICD-10-CM | POA: Diagnosis not present

## 2024-09-07 DIAGNOSIS — Z170 Estrogen receptor positive status [ER+]: Secondary | ICD-10-CM | POA: Diagnosis present

## 2024-09-07 DIAGNOSIS — J343 Hypertrophy of nasal turbinates: Secondary | ICD-10-CM | POA: Insufficient documentation

## 2024-09-07 DIAGNOSIS — J342 Deviated nasal septum: Secondary | ICD-10-CM | POA: Insufficient documentation

## 2024-09-07 DIAGNOSIS — Z79811 Long term (current) use of aromatase inhibitors: Secondary | ICD-10-CM | POA: Diagnosis not present

## 2024-09-07 DIAGNOSIS — H6123 Impacted cerumen, bilateral: Secondary | ICD-10-CM | POA: Insufficient documentation

## 2024-09-07 DIAGNOSIS — M7989 Other specified soft tissue disorders: Secondary | ICD-10-CM | POA: Diagnosis not present

## 2024-09-07 DIAGNOSIS — H608X2 Other otitis externa, left ear: Secondary | ICD-10-CM | POA: Insufficient documentation

## 2024-09-07 DIAGNOSIS — H903 Sensorineural hearing loss, bilateral: Secondary | ICD-10-CM | POA: Insufficient documentation

## 2024-09-07 DIAGNOSIS — H9312 Tinnitus, left ear: Secondary | ICD-10-CM | POA: Insufficient documentation

## 2024-09-07 NOTE — Progress Notes (Signed)
 [facility] Cancer Follow up:    Alexis Darryle BROCKS, DO [address] [place] [address]   DIAGNOSIS: Cancer Staging  Malignant neoplasm of upper-outer quadrant of right breast in female, estrogen receptor positive (HCC) Staging form: Breast, AJCC 8th Edition - Clinical stage from 01/04/2024: Stage IA (cT1b, cN0, cM0, G1, ER+, PR+, HER2-) - Signed by Lanell Donald Stagger, PA-C on 01/04/2024 Stage prefix: Initial diagnosis Method of lymph node assessment: Clinical Histologic grading system: 3 grade system    SUMMARY OF ONCOLOGIC HISTORY: Oncology History  Malignant neoplasm of upper-outer quadrant of right breast in female, estrogen receptor positive (HCC)  12/24/2023 Initial Diagnosis   Screening mammogram detected right upper outer quadrant mass 1 cm by ultrasound at 10 o'clock position.  Axilla negative.  Biopsy: Grade 1 IDC ER 100%, PR 100%, Ki67 10%, HER2 negative by IHC   01/04/2024 Cancer Staging   Staging form: Breast, AJCC 8th Edition - Clinical stage from 01/04/2024: Stage IA (cT1b, cN0, cM0, G1, ER+, PR+, HER2-) - Signed by Lanell Donald Stagger, PA-C on 01/04/2024 Stage prefix: Initial diagnosis Method of lymph node assessment: Clinical Histologic grading system: 3 grade system   01/15/2024 Surgery   Right lumpectomy: 1.6 cm grade 1 IDC with focal high-grade DCIS, margins negative, ER 100%, PR 100%, HER2 1+, Ki-67 10%   01/20/2024 Genetic Testing   Negative [facility] CancerNext-Expanded +RNAinsight Panel.  Report date is 01/20/2024.  The CancerNext-Expanded gene panel offered by [facility] and includes sequencing, rearrangement, and RNA analysis for the following 76 genes: AIP, ALK, APC, ATM, AXIN2, BAP1, BARD1, BMPR1A, BRCA1, BRCA2, BRIP1, CDC73, CDH1, CDK4, CDKN1B, CDKN2A, CEBPA, CHEK2, CTNNA1, DDX41, DICER1, ETV6, FH, FLCN, GATA2, LZTR1, MAX, MBD4, MEN1, MET, MLH1, MSH2, MSH3, MSH6, MUTYH, NF1, NF2, NTHL1, PALB2, PHOX2B, PMS2, POT1, PRKAR1A, PTCH1,  PTEN, RAD51C, RAD51D, RB1, RET, RUNX1, SDHA, SDHAF2, SDHB, SDHC, SDHD, SMAD4, SMARCA4, SMARCB1, SMARCE1, STK11, SUFU, TMEM127, TP53, TSC1, TSC2, VHL, and WT1 (sequencing and deletion/duplication); EGFR, HOXB13, KIT, MITF, PDGFRA, POLD1, and POLE (sequencing only); EPCAM and GREM1 (deletion/duplication only).    01/27/2024 Oncotype testing   Oncotype DX: 28 (distant recurrence at 9 years: 17%)   03/03/2024 - 03/31/2024 Radiation Therapy   Plan Name: Breast_R_UHRT Site: Breast, Right Technique: 3D Mode: Photon Dose Per Fraction: 5.7 Gy Prescribed Dose (Delivered / Prescribed): 28.5 Gy / 28.5 Gy Prescribed Fxs (Delivered / Prescribed): 5 / 5   03/2024 -  Anti-estrogen oral therapy   Anastrozole  x 7 years     CURRENT THERAPY: Anastrozole   INTERVAL HISTORY:  Discussed the use of AI scribe software for clinical note transcription with the patient, who gave verbal consent to proceed.  History of Present Illness Alexis Molina is a 73 year old female with right breast invasive ductal carcinoma who presents with right axillary discomfort.  She has experienced right axillary discomfort for one month, characterized by soreness around the incision site and a sensation of heat. The discomfort is exacerbated by sleeping on the affected side but is not aggravated by touch. Increased activity since July may have contributed to the discomfort.  She is on antiestrogen therapy with anastrozole , started in April 2025, and experiences significant side effects, including hot flashes that cause faintness or nausea, especially at night. She has attempted to reduce these side effects by taking the medication every other day without success. She uses a regular fan and a neck fan to alleviate the hot flashes.  She remains active, engaging in yard  work and evening walks to avoid high heat, and wears supportive bras for comfort during activities.   Patient Active Problem List   Diagnosis Date Noted   Genetic  testing 01/25/2024   Malignant neoplasm of upper-outer quadrant of right breast in female, estrogen receptor positive (HCC) 01/04/2024   Cancer (HCC) 12/2023   Aortic atherosclerosis 04/01/2023   Idiopathic urticaria 03/31/2023   Pruritus 03/31/2023   Subacute cough 03/31/2023   History of vitrectomy 05/19/2022   Left epiretinal membrane 05/19/2022   Cystoid macular edema of left eye 05/19/2022   Diabetes mellitus without complication (HCC) 01/24/2022   History of colonic polyps 01/24/2022   Hypertension 01/24/2022   Raynaud's syndrome without gangrene 01/24/2022   Chronic kidney disease, stage 3b (HCC) 01/24/2022   Diabetic renal disease (HCC) 01/24/2022   Medication management 01/24/2022   Primary open-angle glaucoma, bilateral, mild stage 01/24/2022   Urinary urgency 09/18/2021   Essential hypertension 07/23/2021   Chest pain of uncertain etiology 07/23/2021   Mixed dyslipidemia 07/23/2021   Snake bite 07/22/2021   Raynaud's disease 07/22/2021   Recurrent depression 07/22/2021   Osteopenia of lumbar spine 07/22/2021   Obesity, unspecified 07/22/2021   Irritable bowel syndrome with diarrhea 07/22/2021   Hyperlipidemia 07/22/2021   History of gastritis 07/22/2021   Hiatal hernia 07/22/2021   Hemangioma of intra-abdominal structure 07/22/2021   Family history of colonic polyps 07/22/2021   Fatty liver 07/22/2021   Fatigue 07/22/2021   Cataracts, bilateral 07/22/2021   Body mass index (BMI) 30.0-30.9, adult 07/22/2021   Anxiety 07/22/2021   Allergic rhinitis 07/22/2021   Vitamin D deficiency 07/22/2021   Hypoproteinemia 07/22/2021   Hyperglycemia due to type 2 diabetes mellitus (HCC) 07/22/2021   Gastroesophageal reflux disease 07/22/2021   Anemia due to chronic blood loss 07/22/2021   Acute gastritis 07/22/2021   Problematic vaginal discharge 07/22/2021   Vulvitis 07/22/2021   Vitreous floaters of right eye 07/22/2021   Vaginal lesion 07/22/2021   Prediabetes  07/22/2021   Other allergy status, other than to drugs and biological substances 07/22/2021   Dysgeusia 12/18/2020   Anosmia 12/18/2020   Hypomagnesemia    Hypokalemia    Generalized abdominal pain 09/29/2020   Abdominal pain 09/29/2020   Enteritis 09/28/2020   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   Other vitreous opacities, right eye 09/11/2020   Posterior vitreous detachment of right eye 09/11/2020   Posterior vitreous detachment of both eyes 09/11/2020   Shortness of breath 12/12/2019   Incontinence of feces with fecal urgency 02/24/2019   Dysfunctional voiding of urine 02/24/2019   Urge urinary incontinence 02/24/2019   S/P arthroscopy of left shoulder 04/07/2017   Angina pectoris (HCC) 01/28/2017   Neck pain 12/15/2016   Tear of left rotator cuff 12/15/2016   Chronic left shoulder pain 09/18/2016   Carpal tunnel syndrome of left wrist 06/03/2016   Trigger middle finger of left hand 01/24/2016   Type 2 diabetes mellitus with other specified complication (HCC) 05/11/2015   Family history of premature CAD 05/11/2015   Hypothyroidism 12/08/1996   Hypertensive disorder 12/08/1988    is allergic to darvocet [propoxyphene n-acetaminophen ], orange fruit [citrus], vicodin [hydrocodone -acetaminophen ], dust mite extract, betamethasone, celecoxib, morphine , oxycodone-acetaminophen , wound dressing adhesive, glimepiride, penicillins, and sulfa antibiotics.  MEDICAL HISTORY: Past Medical History:  Diagnosis Date   Abdominal pain 09/29/2020   Acute gastritis 07/22/2021   Allergic rhinitis    Anemia due to chronic blood loss 07/22/2021   Angina pectoris 01/28/2017   Anosmia 12/18/2020  Anxiety    Aortic atherosclerosis 04/01/2023   Body mass index (BMI) 30.0-30.9, adult 07/22/2021   Cancer (HCC) 12/2023   right breast IDC   Carpal tunnel syndrome of left wrist 06/03/2016   Cataracts, bilateral    Chest pain of uncertain etiology 07/23/2021   Chronic kidney disease,  stage 3b (HCC) 01/24/2022   Chronic left shoulder pain 09/18/2016   Cystoid macular edema of left eye 05/19/2022   New onset   Diabetes mellitus without complication (HCC)    Diabetic renal disease (HCC) 01/24/2022   Dysfunctional voiding of urine 02/24/2019   Dysgeusia 12/18/2020   Enteritis 09/28/2020   Essential hypertension 07/23/2021   Family history of colonic polyps 07/22/2021   Family history of premature CAD 05/11/2015   Fatigue    Fatty liver    Gastroesophageal reflux disease 07/22/2021   Generalized abdominal pain 09/29/2020   Hemangioma of intra-abdominal structure 07/22/2021   Hiatal hernia    History of colonic polyps    History of gastritis 07/22/2021   History of vitrectomy 05/19/2022   Hyperglycemia due to type 2 diabetes mellitus (HCC) 07/22/2021   Hyperlipidemia    Hypertension    Hypertensive disorder 12/08/1988   Hypokalemia    Hypomagnesemia    Hypoproteinemia 07/22/2021   Hypothyroidism    Idiopathic urticaria 03/31/2023   Incontinence of feces with fecal urgency 02/24/2019   Irritable bowel syndrome with diarrhea 07/22/2021   Left epiretinal membrane 05/19/2022   Malignant neoplasm of upper-outer quadrant of right breast in female, estrogen receptor positive (HCC) 01/04/2024   Medication management 01/24/2022   Mixed dyslipidemia 07/23/2021   Neck pain 12/15/2016   Obesity, unspecified    Osteopenia of lumbar spine 07/22/2021   Other allergy status, other than to drugs and biological substances 07/22/2021   Other vitreous opacities, right eye 09/11/2020   Peripheral edema    Peripheral edema    after knee surgery   Posterior vitreous detachment of both eyes 09/11/2020   Posterior vitreous detachment of right eye 09/11/2020   Prediabetes 07/22/2021   Primary open-angle glaucoma, bilateral, mild stage 01/24/2022   Problematic vaginal discharge 07/22/2021   Pruritus 03/31/2023   Raynaud's disease 07/22/2021   Raynaud's syndrome without gangrene     Recurrent depression 07/22/2021   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   S/P arthroscopy of left shoulder 04/07/2017   Shortness of breath 12/12/2019   Snake bite    Subacute cough 03/31/2023   Tear of left rotator cuff 12/15/2016   Trigger middle finger of left hand 01/24/2016   Tubular adenoma of colon    Type 2 diabetes mellitus with other specified complication (HCC) 05/11/2015   no meds per patient   Urge urinary incontinence 02/24/2019   Urinary urgency 09/18/2021   Vaginal lesion 07/22/2021   Vitamin D deficiency    Vitreous floaters of right eye    Vulvitis 07/22/2021    SURGICAL HISTORY: Past Surgical History:  Procedure Laterality Date   ABDOMINAL HYSTERECTOMY     BIOPSY  10/02/2020   Procedure: BIOPSY;  Surgeon: Dianna Specking, MD;  Location: [facility];  Service: Endoscopy;;   BREAST LUMPECTOMY WITH RADIOACTIVE SEED LOCALIZATION Right 01/15/2024   Procedure: RIGHT BREAST LUMPECTOMY WITH RADIOACTIVE SEED LOCALIZATION;  Surgeon: Curvin Deward MOULD, MD;  Location: [facility];  Service: General;  Laterality: Right;   CARDIAC CATHETERIZATION N/A 07/30/2015   Procedure: Left Heart Cath and Coronary Angiography;  Surgeon: Candyce GORMAN Reek, MD;  Location: [facility] INVASIVE CV LAB;  Service: Cardiovascular;  Laterality: N/A;   CARPAL TUNNEL RELEASE     CHOLECYSTECTOMY     ESOPHAGOGASTRODUODENOSCOPY (EGD) WITH PROPOFOL  N/A 10/02/2020   Procedure: ESOPHAGOGASTRODUODENOSCOPY (EGD) WITH PROPOFOL ;  Surgeon: Dianna Specking, MD;  Location: [facility];  Service: Endoscopy;  Laterality: N/A;   KNEE SURGERY     ROTATOR CUFF REPAIR Left    TUBAL LIGATION      SOCIAL HISTORY: Social History   Socioeconomic History   Marital status: Widowed    Spouse name: Not on file   Number of children: Not on file   Years of education: Not on file   Highest education level: Not on file  Occupational History   Not on file  Tobacco Use   Smoking status: Former     Current packs/day: 0.00    Types: Cigarettes    Quit date: 12/09/1987    Years since quitting: 36.7   Smokeless tobacco: Never  Vaping Use   Vaping status: Never Used  Substance and Sexual Activity   Alcohol use: No   Drug use: No   Sexual activity: Not Currently    Birth control/protection: Surgical    Comment: hyst  Other Topics Concern   Not on file  Social History Narrative   Not on file   Social Drivers of Health   Financial Resource Strain: Not on file  Food Insecurity: Low Risk  (03/25/2024)   Received from [facility]   Hunger Vital Sign    Within the past 12 months, you worried that your food would run out before you got money to buy more: Never true    Within the past 12 months, the food you bought just didn't last and you didn't have money to get more. : Never true  Transportation Needs: No Transportation Needs (03/25/2024)   Received from [facility]    In the past 12 months, has lack of reliable transportation kept you from medical appointments, meetings, work or from getting things needed for daily living? : No  Physical Activity: Not on file  Stress: Not on file  Social Connections: Not on file  Intimate Partner Violence: Not At Risk (02/11/2024)   Humiliation, Afraid, Rape, and Kick questionnaire    Fear of Current or Ex-Partner: No    Emotionally Abused: No    Physically Abused: No    Sexually Abused: No    FAMILY HISTORY: Family History  Problem Relation Age of Onset   Alzheimer's disease Mother    Diabetes Mother    Hypertension Mother    Ulcers Father    Hypertension Father    Arthritis Father    Heart failure Father    Uterine cancer Sister 4       d. 4   Lung cancer Brother 51       d. 39   Prostate cancer Paternal Uncle        d. 39; mets   Ovarian cancer Daughter        dx 30s    Review of Systems  Constitutional:  Negative for appetite change, chills, fatigue, fever and unexpected weight change.  HENT:    Negative for hearing loss, lump/mass and trouble swallowing.   Eyes:  Negative for eye problems and icterus.  Respiratory:  Negative for chest tightness, cough and shortness of breath.   Cardiovascular:  Negative for chest pain, leg swelling and palpitations.  Gastrointestinal:  Negative for abdominal distention, abdominal pain, constipation, diarrhea, nausea and vomiting.  Endocrine: Positive  for hot flashes.  Genitourinary:  Negative for difficulty urinating.   Musculoskeletal:  Negative for arthralgias.  Skin:  Negative for itching and rash.  Neurological:  Negative for dizziness, extremity weakness, headaches and numbness.  Hematological:  Negative for adenopathy. Does not bruise/bleed easily.  Psychiatric/Behavioral:  Negative for depression. The patient is not nervous/anxious.       PHYSICAL EXAMINATION   Onc Performance Status - 09/07/24 1000       KPS SCALE   KPS % SCORE Able to carry on normal activity, minor s/s of disease          Vitals:   09/07/24 1047  BP: (!) 125/51  Pulse: 65  Resp: 16  Temp: (!) 97.3 F (36.3 C)  SpO2: 99%    Physical Exam Constitutional:      General: She is not in acute distress.    Appearance: Normal appearance. She is not toxic-appearing.  HENT:     Head: Normocephalic and atraumatic.     Mouth/Throat:     Mouth: Mucous membranes are moist.     Pharynx: Oropharynx is clear. No oropharyngeal exudate or posterior oropharyngeal erythema.  Eyes:     General: No scleral icterus. Cardiovascular:     Rate and Rhythm: Normal rate and regular rhythm.     Pulses: Normal pulses.     Heart sounds: Normal heart sounds.  Pulmonary:     Effort: Pulmonary effort is normal.     Breath sounds: Normal breath sounds.  Abdominal:     General: Abdomen is flat. Bowel sounds are normal. There is no distension.     Palpations: Abdomen is soft.     Tenderness: There is no abdominal tenderness.  Musculoskeletal:        General: No swelling.      Cervical back: Neck supple.  Lymphadenopathy:     Cervical: No cervical adenopathy.  Skin:    General: Skin is warm and dry.     Findings: No rash.  Neurological:     General: No focal deficit present.     Mental Status: She is alert.  Psychiatric:        Mood and Affect: Mood normal.        Behavior: Behavior normal.     LABORATORY DATA:  CBC    Component Value Date/Time   WBC 4.2 07/07/2024 1321   WBC 3.6 (L) 10/03/2020 0428   RBC 4.51 07/07/2024 1321   HGB 12.5 07/07/2024 1321   HCT 36.1 07/07/2024 1321   PLT 189 07/07/2024 1321   MCV 80.0 07/07/2024 1321   MCH 27.7 07/07/2024 1321   MCHC 34.6 07/07/2024 1321   RDW 14.1 07/07/2024 1321   LYMPHSABS 0.7 07/07/2024 1321   MONOABS 0.4 07/07/2024 1321   EOSABS 0.1 07/07/2024 1321   BASOSABS 0.0 07/07/2024 1321    CMP     Component Value Date/Time   NA 139 07/07/2024 1321   NA 145 (H) 04/10/2023 1024   K 3.8 07/07/2024 1321   CL 102 07/07/2024 1321   CO2 33 (H) 07/07/2024 1321   GLUCOSE 84 07/07/2024 1321   BUN 15 07/07/2024 1321   BUN 13 04/10/2023 1024   CREATININE 1.10 (H) 07/07/2024 1321   CALCIUM  9.5 07/07/2024 1321   PROT 7.2 07/07/2024 1321   ALBUMIN 4.0 07/07/2024 1321   AST 20 07/07/2024 1321   ALT 16 07/07/2024 1321   ALKPHOS 50 07/07/2024 1321   BILITOT 0.9 07/07/2024 1321   GFRNONAA 53 (L) 07/07/2024  1321   GFRAA 63 01/25/2020 1054     ASSESSMENT and THERAPY PLAN:   Assessment & Plan Right breast invasive ductal carcinoma, ERPR positive, status post lumpectomy, adjuvant radiation, and ongoing anastrozole  therapy Status post right lumpectomy and adjuvant radiation for ERPR positive right breast invasive ductal carcinoma. Currently on anastrozole  therapy since April 2025. No acute concerns during this visit. - Continue anastrozole  therapy.  Right axillary swelling and discomfort following breast cancer treatment Right axillary swelling and discomfort noted approximately one month ago.  Differential includes post-radiation changes and possible fluid accumulation. No signs of lymphedema or concerning masses on examination. Discussed the possibility of fluid accumulation from radiation, which can take up to a year to resolve. - Order right breast mammogram and ultrasound to evaluate axillary swelling. - Advise use of ice for symptomatic relief. - Consider referral to physical therapy for massage or stretching if swelling persists.  Hot flashes secondary to anastrozole  therapy Experiencing significant hot flashes since starting anastrozole  therapy, leading to discomfort and episodes of feeling faint. Symptoms are impacting daily activities and sleep. Discussed non-pharmacological interventions such as neck fans and supportive bras. - Recommend use of neck fan for symptomatic relief. - Advise wearing supportive bras, especially during physical activity.   All questions were answered. The patient knows to call the clinic with any problems, questions or concerns. We can certainly see the patient much sooner if necessary.  Total encounter time:20 minutes*in face-to-face visit time, chart review, lab review, care coordination, order entry, and documentation of the encounter time.    Morna Kendall, NP 09/07/24 11:06 AM Medical Oncology and Hematology [facility] [address] [place], [place] [address] Tel. [phone]    Fax. [phone]  *Total Encounter Time as defined by the Centers for Medicare and Medicaid Services includes, in addition to the face-to-face time of a patient visit (documented in the note above) non-face-to-face time: obtaining and reviewing outside history, ordering and reviewing medications, tests or procedures, care coordination (communications with other health care professionals or caregivers) and documentation in the medical record.

## 2024-09-22 ENCOUNTER — Ambulatory Visit: Admitting: Adult Health

## 2024-09-30 ENCOUNTER — Encounter: Payer: Self-pay | Admitting: Adult Health

## 2024-09-30 ENCOUNTER — Inpatient Hospital Stay (HOSPITAL_BASED_OUTPATIENT_CLINIC_OR_DEPARTMENT_OTHER): Admitting: Adult Health

## 2024-09-30 VITALS — BP 114/55 | HR 67 | Temp 97.8°F | Resp 16 | Ht 62.0 in | Wt 155.2 lb

## 2024-09-30 DIAGNOSIS — C50411 Malignant neoplasm of upper-outer quadrant of right female breast: Secondary | ICD-10-CM

## 2024-09-30 DIAGNOSIS — Z170 Estrogen receptor positive status [ER+]: Secondary | ICD-10-CM | POA: Diagnosis not present

## 2024-09-30 MED ORDER — LETROZOLE 2.5 MG PO TABS: 2.5000 mg | ORAL_TABLET | Freq: Every day | ORAL | 3 refills | Status: AC

## 2024-09-30 NOTE — Progress Notes (Signed)
 [facility] Cancer Follow up:    Alexis Darryle BROCKS, DO [address] [place] [address]   DIAGNOSIS: Cancer Staging  Malignant neoplasm of upper-outer quadrant of right breast in female, estrogen receptor positive (HCC) Staging form: Breast, AJCC 8th Edition - Clinical stage from 01/04/2024: Stage IA (cT1b, cN0, cM0, G1, ER+, PR+, HER2-) - Signed by Lanell Donald Stagger, PA-C on 01/04/2024 Stage prefix: Initial diagnosis Method of lymph node assessment: Clinical Histologic grading system: 3 grade system    SUMMARY OF ONCOLOGIC HISTORY: Oncology History  Malignant neoplasm of upper-outer quadrant of right breast in female, estrogen receptor positive (HCC)  12/24/2023 Initial Diagnosis   Screening mammogram detected right upper outer quadrant mass 1 cm by ultrasound at 10 o'clock position.  Axilla negative.  Biopsy: Grade 1 IDC ER 100%, PR 100%, Ki67 10%, HER2 negative by IHC   01/04/2024 Cancer Staging   Staging form: Breast, AJCC 8th Edition - Clinical stage from 01/04/2024: Stage IA (cT1b, cN0, cM0, G1, ER+, PR+, HER2-) - Signed by Lanell Donald Stagger, PA-C on 01/04/2024 Stage prefix: Initial diagnosis Method of lymph node assessment: Clinical Histologic grading system: 3 grade system   01/15/2024 Surgery   Right lumpectomy: 1.6 cm grade 1 IDC with focal high-grade DCIS, margins negative, ER 100%, PR 100%, HER2 1+, Ki-67 10%   01/20/2024 Genetic Testing   Negative [facility] CancerNext-Expanded +RNAinsight Panel.  Report date is 01/20/2024.  The CancerNext-Expanded gene panel offered by [facility] and includes sequencing, rearrangement, and RNA analysis for the following 76 genes: AIP, ALK, APC, ATM, AXIN2, BAP1, BARD1, BMPR1A, BRCA1, BRCA2, BRIP1, CDC73, CDH1, CDK4, CDKN1B, CDKN2A, CEBPA, CHEK2, CTNNA1, DDX41, DICER1, ETV6, FH, FLCN, GATA2, LZTR1, MAX, MBD4, MEN1, MET, MLH1, MSH2, MSH3, MSH6, MUTYH, NF1, NF2, NTHL1, PALB2, PHOX2B, PMS2, POT1, PRKAR1A, PTCH1,  PTEN, RAD51C, RAD51D, RB1, RET, RUNX1, SDHA, SDHAF2, SDHB, SDHC, SDHD, SMAD4, SMARCA4, SMARCB1, SMARCE1, STK11, SUFU, TMEM127, TP53, TSC1, TSC2, VHL, and WT1 (sequencing and deletion/duplication); EGFR, HOXB13, KIT, MITF, PDGFRA, POLD1, and POLE (sequencing only); EPCAM and GREM1 (deletion/duplication only).    01/27/2024 Oncotype testing   Oncotype DX: 28 (distant recurrence at 9 years: 17%)   03/03/2024 - 03/31/2024 Radiation Therapy   Plan Name: Breast_R_UHRT Site: Breast, Right Technique: 3D Mode: Photon Dose Per Fraction: 5.7 Gy Prescribed Dose (Delivered / Prescribed): 28.5 Gy / 28.5 Gy Prescribed Fxs (Delivered / Prescribed): 5 / 5   03/2024 -  Anti-estrogen oral therapy   Anastrozole  x 7 years     CURRENT THERAPY: anastrozole   INTERVAL HISTORY: Discussed the use of AI scribe software for clinical note transcription with the patient, who gave verbal consent to proceed.  History of Present Illness Alexis Molina is a 73 year old female with stage 1A invasive carcinoma, ER, PR positive, who presents with right axillary swelling.  She was diagnosed with stage 1A invasive carcinoma, ER, PR positive, in January 2025 and underwent a lumpectomy followed by adjuvant radiation and antiestrogen therapy with anastrozole . She experiences hot flashes on the days she takes anastrozole , which she takes every other day in the morning. On days she does not take it, she does not experience hot flashes.  She was seen on October 1st for right axillary swelling. A mammogram and ultrasound were performed to evaluate the right axillary swelling. She has been wearing a sports bra for support but finds it insufficient and is experimenting with different bras for comfort.    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 09/07/2024   Sensorineural hearing loss, bilateral 09/07/2024   Left-sided tinnitus 09/07/2024   Genetic testing 01/25/2024   Malignant neoplasm of upper-outer quadrant of right breast in female, estrogen receptor positive (HCC) 01/04/2024   Cancer (HCC) 12/2023   Aortic atherosclerosis 04/01/2023   Idiopathic urticaria 03/31/2023   Pruritus 03/31/2023   Subacute cough 03/31/2023   History of vitrectomy 05/19/2022   Left epiretinal membrane 05/19/2022   Cystoid macular edema of left eye 05/19/2022   Diabetes mellitus without complication (HCC) 01/24/2022   History of colonic polyps 01/24/2022   Hypertension 01/24/2022   Raynaud's syndrome without gangrene 01/24/2022   Chronic kidney disease, stage 3b (HCC) 01/24/2022   Diabetic renal disease (HCC) 01/24/2022   Medication management 01/24/2022   Primary open-angle glaucoma, bilateral, mild stage 01/24/2022   Urinary urgency 09/18/2021   Essential hypertension 07/23/2021   Chest pain of uncertain etiology 07/23/2021   Mixed dyslipidemia 07/23/2021   Snake bite 07/22/2021   Raynaud's disease 07/22/2021   Recurrent depression 07/22/2021   Osteopenia of lumbar spine 07/22/2021   Obesity, unspecified 07/22/2021   Irritable bowel syndrome with diarrhea 07/22/2021   Hyperlipidemia 07/22/2021   History of gastritis 07/22/2021   Hiatal hernia 07/22/2021   Hemangioma of intra-abdominal structure 07/22/2021   Family history of colonic polyps 07/22/2021   Fatty liver 07/22/2021   Fatigue 07/22/2021   Cataracts, bilateral 07/22/2021   Body mass index (BMI) 30.0-30.9, adult 07/22/2021   Anxiety 07/22/2021   Allergic rhinitis 07/22/2021   Vitamin D deficiency 07/22/2021   Hypoproteinemia 07/22/2021   Hyperglycemia due to type 2 diabetes mellitus (HCC) 07/22/2021   Gastroesophageal reflux disease 07/22/2021   Anemia due to chronic blood loss 07/22/2021   Acute gastritis 07/22/2021   Problematic vaginal  discharge 07/22/2021   Vulvitis 07/22/2021   Vitreous floaters of right eye 07/22/2021   Vaginal lesion 07/22/2021   Prediabetes 07/22/2021   Other allergy status, other than to drugs and biological substances 07/22/2021   Dysgeusia 12/18/2020   Anosmia 12/18/2020   Hypomagnesemia    Hypokalemia    Generalized abdominal pain 09/29/2020   Abdominal pain 09/29/2020   Enteritis 09/28/2020   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   Other vitreous opacities, right eye 09/11/2020   Posterior vitreous detachment of right eye 09/11/2020   Posterior vitreous detachment of both eyes 09/11/2020   Shortness of breath 12/12/2019   Incontinence of feces with fecal urgency 02/24/2019   Dysfunctional voiding of urine 02/24/2019   Urge urinary incontinence 02/24/2019   S/P arthroscopy of left shoulder 04/07/2017   Angina pectoris (HCC) 01/28/2017   Neck pain 12/15/2016   Tear of left rotator cuff 12/15/2016   Chronic left shoulder pain 09/18/2016   Carpal tunnel syndrome of left wrist 06/03/2016   Trigger middle finger of left hand 01/24/2016   Type 2 diabetes mellitus with other specified complication (HCC) 05/11/2015   Family history of premature CAD 05/11/2015   Hypothyroidism 12/08/1996   Hypertensive disorder 12/08/1988    is allergic to darvocet [propoxyphene n-acetaminophen ], orange fruit [citrus], vicodin [hydrocodone -acetaminophen ], dust mite extract, betamethasone, celecoxib, morphine , oxycodone-acetaminophen , wound dressing adhesive, glimepiride, penicillins, and sulfa antibiotics.  MEDICAL HISTORY: Past Medical History:  Diagnosis Date   Abdominal pain 09/29/2020   Acute gastritis 07/22/2021  Allergic rhinitis    Anemia due to chronic blood loss 07/22/2021   Angina pectoris 01/28/2017   Anosmia 12/18/2020   Anxiety    Aortic atherosclerosis 04/01/2023   Body mass index (BMI) 30.0-30.9, adult 07/22/2021   Cancer (HCC) 12/2023   right breast IDC   Carpal tunnel  syndrome of left wrist 06/03/2016   Cataracts, bilateral    Chest pain of uncertain etiology 07/23/2021   Chronic kidney disease, stage 3b (HCC) 01/24/2022   Chronic left shoulder pain 09/18/2016   Cystoid macular edema of left eye 05/19/2022   New onset   Diabetes mellitus without complication (HCC)    Diabetic renal disease (HCC) 01/24/2022   Dysfunctional voiding of urine 02/24/2019   Dysgeusia 12/18/2020   Enteritis 09/28/2020   Essential hypertension 07/23/2021   Family history of colonic polyps 07/22/2021   Family history of premature CAD 05/11/2015   Fatigue    Fatty liver    Gastroesophageal reflux disease 07/22/2021   Generalized abdominal pain 09/29/2020   Hemangioma of intra-abdominal structure 07/22/2021   Hiatal hernia    History of colonic polyps    History of gastritis 07/22/2021   History of vitrectomy 05/19/2022   Hyperglycemia due to type 2 diabetes mellitus (HCC) 07/22/2021   Hyperlipidemia    Hypertension    Hypertensive disorder 12/08/1988   Hypokalemia    Hypomagnesemia    Hypoproteinemia 07/22/2021   Hypothyroidism    Idiopathic urticaria 03/31/2023   Incontinence of feces with fecal urgency 02/24/2019   Irritable bowel syndrome with diarrhea 07/22/2021   Left epiretinal membrane 05/19/2022   Malignant neoplasm of upper-outer quadrant of right breast in female, estrogen receptor positive (HCC) 01/04/2024   Medication management 01/24/2022   Mixed dyslipidemia 07/23/2021   Neck pain 12/15/2016   Obesity, unspecified    Osteopenia of lumbar spine 07/22/2021   Other allergy status, other than to drugs and biological substances 07/22/2021   Other vitreous opacities, right eye 09/11/2020   Peripheral edema    Peripheral edema    after knee surgery   Posterior vitreous detachment of both eyes 09/11/2020   Posterior vitreous detachment of right eye 09/11/2020   Prediabetes 07/22/2021   Primary open-angle glaucoma, bilateral, mild stage 01/24/2022    Problematic vaginal discharge 07/22/2021   Pruritus 03/31/2023   Raynaud's disease 07/22/2021   Raynaud's syndrome without gangrene    Recurrent depression 07/22/2021   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   S/P arthroscopy of left shoulder 04/07/2017   Shortness of breath 12/12/2019   Snake bite    Subacute cough 03/31/2023   Tear of left rotator cuff 12/15/2016   Trigger middle finger of left hand 01/24/2016   Tubular adenoma of colon    Type 2 diabetes mellitus with other specified complication (HCC) 05/11/2015   no meds per patient   Urge urinary incontinence 02/24/2019   Urinary urgency 09/18/2021   Vaginal lesion 07/22/2021   Vitamin D deficiency    Vitreous floaters of right eye    Vulvitis 07/22/2021    SURGICAL HISTORY: Past Surgical History:  Procedure Laterality Date   ABDOMINAL HYSTERECTOMY     BIOPSY  10/02/2020   Procedure: BIOPSY;  Surgeon: Dianna Specking, MD;  Location: [facility];  Service: Endoscopy;;   BREAST LUMPECTOMY WITH RADIOACTIVE SEED LOCALIZATION Right 01/15/2024   Procedure: RIGHT BREAST LUMPECTOMY WITH RADIOACTIVE SEED LOCALIZATION;  Surgeon: Curvin Deward MOULD, MD;  Location: [facility];  Service: General;  Laterality: Right;   CARDIAC CATHETERIZATION  N/A 07/30/2015   Procedure: Left Heart Cath and Coronary Angiography;  Surgeon: Candyce GORMAN Reek, MD;  Location: [facility];  Service: Cardiovascular;  Laterality: N/A;   CARPAL TUNNEL RELEASE     CHOLECYSTECTOMY     ESOPHAGOGASTRODUODENOSCOPY (EGD) WITH PROPOFOL  N/A 10/02/2020   Procedure: ESOPHAGOGASTRODUODENOSCOPY (EGD) WITH PROPOFOL ;  Surgeon: Dianna Specking, MD;  Location: [facility];  Service: Endoscopy;  Laterality: N/A;   KNEE SURGERY     ROTATOR CUFF REPAIR Left    TUBAL LIGATION      SOCIAL HISTORY: Social History   Socioeconomic History   Marital status: Widowed    Spouse name: Not on file   Number of children: Not on file   Years of  education: Not on file   Highest education level: Not on file  Occupational History   Not on file  Tobacco Use   Smoking status: Former    Current packs/day: 0.00    Types: Cigarettes    Quit date: 12/09/1987    Years since quitting: 36.8   Smokeless tobacco: Never  Vaping Use   Vaping status: Never Used  Substance and Sexual Activity   Alcohol use: No   Drug use: No   Sexual activity: Not Currently    Birth control/protection: Surgical    Comment: hyst  Other Topics Concern   Not on file  Social History Narrative   Not on file   Social Drivers of Health   Financial Resource Strain: Not on file  Food Insecurity: Low Risk  (03/25/2024)   Received from [facility]   Hunger Vital Sign    Within the past 12 months, you worried that your food would run out before you got money to buy more: Never true    Within the past 12 months, the food you bought just didn't last and you didn't have money to get more. : Never true  Transportation Needs: No Transportation Needs (03/25/2024)   Received from [facility]    In the past 12 months, has lack of reliable transportation kept you from medical appointments, meetings, work or from getting things needed for daily living? : No  Physical Activity: Not on file  Stress: Not on file  Social Connections: Not on file  Intimate Partner Violence: Not At Risk (02/11/2024)   Humiliation, Afraid, Rape, and Kick questionnaire    Fear of Current or Ex-Partner: No    Emotionally Abused: No    Physically Abused: No    Sexually Abused: No    FAMILY HISTORY: Family History  Problem Relation Age of Onset   Alzheimer's disease Mother    Diabetes Mother    Hypertension Mother    Ulcers Father    Hypertension Father    Arthritis Father    Heart failure Father    Uterine cancer Sister 4       d. 4   Lung cancer Brother 51       d. 58   Prostate cancer Paternal Uncle        d. 61; mets   Ovarian cancer Daughter        dx 30s     Review of Systems  Constitutional:  Negative for appetite change, chills, fatigue, fever and unexpected weight change.  HENT:   Negative for hearing loss, lump/mass and trouble swallowing.   Eyes:  Negative for eye problems and icterus.  Respiratory:  Negative for chest tightness, cough and shortness of breath.   Cardiovascular:  Negative  for chest pain, leg swelling and palpitations.  Gastrointestinal:  Negative for abdominal distention, abdominal pain, constipation, diarrhea, nausea and vomiting.  Endocrine: Positive for hot flashes.  Genitourinary:  Negative for difficulty urinating.   Musculoskeletal:  Negative for arthralgias.  Skin:  Negative for itching and rash.  Neurological:  Negative for dizziness, extremity weakness, headaches and numbness.  Hematological:  Negative for adenopathy. Does not bruise/bleed easily.  Psychiatric/Behavioral:  Negative for depression. The patient is not nervous/anxious.       PHYSICAL EXAMINATION   Onc Performance Status - 09/30/24 1126       KPS SCALE   KPS % SCORE Able to carry on normal activity, minor s/s of disease (P)           Vitals:   09/30/24 1118  BP: (!) 114/55  Pulse: 67  Resp: 16  Temp: 97.8 F (36.6 C)  SpO2: 100%    Physical Exam Constitutional:      General: She is not in acute distress.    Appearance: Normal appearance. She is not toxic-appearing.  HENT:     Head: Normocephalic and atraumatic.     Mouth/Throat:     Mouth: Mucous membranes are moist.     Pharynx: Oropharynx is clear. No oropharyngeal exudate or posterior oropharyngeal erythema.  Eyes:     General: No scleral icterus. Cardiovascular:     Rate and Rhythm: Normal rate and regular rhythm.     Pulses: Normal pulses.     Heart sounds: Normal heart sounds.  Pulmonary:     Effort: Pulmonary effort is normal.     Breath sounds: Normal breath sounds.  Chest:     Comments: Right breast s/p lumpectomy and radiation, no sign of local  recurrence, left breast benign Abdominal:     General: Abdomen is flat. Bowel sounds are normal. There is no distension.     Palpations: Abdomen is soft.     Tenderness: There is no abdominal tenderness.  Musculoskeletal:        General: No swelling.     Cervical back: Neck supple.  Lymphadenopathy:     Cervical: No cervical adenopathy.     Upper Body:     Right upper body: No supraclavicular or axillary adenopathy.     Left upper body: No supraclavicular or axillary adenopathy.  Skin:    General: Skin is warm and dry.     Findings: No rash.  Neurological:     General: No focal deficit present.     Mental Status: She is alert.  Psychiatric:        Mood and Affect: Mood normal.        Behavior: Behavior normal.     LABORATORY DATA:  CBC    Component Value Date/Time   WBC 4.2 07/07/2024 1321   WBC 3.6 (L) 10/03/2020 0428   RBC 4.51 07/07/2024 1321   HGB 12.5 07/07/2024 1321   HCT 36.1 07/07/2024 1321   PLT 189 07/07/2024 1321   MCV 80.0 07/07/2024 1321   MCH 27.7 07/07/2024 1321   MCHC 34.6 07/07/2024 1321   RDW 14.1 07/07/2024 1321   LYMPHSABS 0.7 07/07/2024 1321   MONOABS 0.4 07/07/2024 1321   EOSABS 0.1 07/07/2024 1321   BASOSABS 0.0 07/07/2024 1321    CMP     Component Value Date/Time   NA 139 07/07/2024 1321   NA 145 (H) 04/10/2023 1024   K 3.8 07/07/2024 1321   CL 102 07/07/2024 1321   CO2 33 (H)  07/07/2024 1321   GLUCOSE 84 07/07/2024 1321   BUN 15 07/07/2024 1321   BUN 13 04/10/2023 1024   CREATININE 1.10 (H) 07/07/2024 1321   CALCIUM  9.5 07/07/2024 1321   PROT 7.2 07/07/2024 1321   ALBUMIN 4.0 07/07/2024 1321   AST 20 07/07/2024 1321   ALT 16 07/07/2024 1321   ALKPHOS 50 07/07/2024 1321   BILITOT 0.9 07/07/2024 1321   GFRNONAA 53 (L) 07/07/2024 1321   GFRAA 63 01/25/2020 1054     ASSESSMENT and THERAPY PLAN:   Assessment and Plan Assessment & Plan ER/PR-positive stage IA invasive breast carcinoma Diagnosed in January 2025, status post  lumpectomy, adjuvant radiation, and antiestrogen therapy with anastrozole . Recent imaging for right axillary swelling showed no concerning findings. - Continue follow-up with oncologist. - Follow-up with [facility] in February.  Hot flashes secondary to antiestrogen therapy Hot flashes associated with anastrozole  use. Discussed potential side effects and alternative options. Letrozole may be better tolerated. Discussed medication holiday and switching to letrozole if symptoms persist. - Stop anastrozole  for two weeks. - Start letrozole after two-week break. - Contact clinic if letrozole is not tolerated.  Change in bowel habits, possibly medication-related Change in bowel habits possibly related to anastrozole . - Evaluate for improvement after switching to letrozole.  RTC in February for f/u with Dr. Odean   All questions were answered. The patient knows to call the clinic with any problems, questions or concerns. We can certainly see the patient much sooner if necessary.  Total encounter time:30 minutes*in face-to-face visit time, chart review, lab review, care coordination, order entry, and documentation of the encounter time.    Morna Kendall, NP 09/30/24 11:29 AM Medical Oncology and Hematology [facility] [address] [place], [place] [address] Tel. [phone]    Fax. [phone]  *Total Encounter Time as defined by the Centers for Medicare and Medicaid Services includes, in addition to the face-to-face time of a patient visit (documented in the note above) non-face-to-face time: obtaining and reviewing outside history, ordering and reviewing medications, tests or procedures, care coordination (communications with other health care professionals or caregivers) and documentation in the medical record.

## 2024-12-14 ENCOUNTER — Encounter (INDEPENDENT_AMBULATORY_CARE_PROVIDER_SITE_OTHER): Payer: Self-pay | Admitting: Otolaryngology

## 2024-12-14 ENCOUNTER — Ambulatory Visit (INDEPENDENT_AMBULATORY_CARE_PROVIDER_SITE_OTHER): Admitting: Otolaryngology

## 2024-12-14 VITALS — Ht 62.0 in | Wt 155.0 lb

## 2024-12-14 DIAGNOSIS — H6123 Impacted cerumen, bilateral: Secondary | ICD-10-CM | POA: Diagnosis not present

## 2024-12-14 DIAGNOSIS — J310 Chronic rhinitis: Secondary | ICD-10-CM

## 2024-12-14 DIAGNOSIS — H903 Sensorineural hearing loss, bilateral: Secondary | ICD-10-CM | POA: Diagnosis not present

## 2024-12-14 DIAGNOSIS — H608X2 Other otitis externa, left ear: Secondary | ICD-10-CM

## 2024-12-14 DIAGNOSIS — J343 Hypertrophy of nasal turbinates: Secondary | ICD-10-CM

## 2024-12-14 DIAGNOSIS — H9312 Tinnitus, left ear: Secondary | ICD-10-CM | POA: Diagnosis not present

## 2024-12-14 DIAGNOSIS — J342 Deviated nasal septum: Secondary | ICD-10-CM

## 2024-12-14 MED ORDER — MOMETASONE FUROATE 0.1 % EX CREA: TOPICAL_CREAM | CUTANEOUS | 3 refills | Status: AC

## 2024-12-14 MED ORDER — IPRATROPIUM BROMIDE 0.06 % NA SOLN: 2.0000 | Freq: Two times a day (BID) | NASAL | 12 refills | Status: AC | PRN

## 2024-12-14 NOTE — Progress Notes (Signed)
 Patient ID: Alexis Molina, female   DOB: Feb 11, 1951, 74 y.o.   MRN: [id number]  Follow up: Hearing loss, left ear tinnitus, chronic nasal congestion, nasal drainage  History of Present Illness Alexis Molina is a 74 year old female who returns today for her otolaryngology follow-up.  For the past three months, she has experienced persistent left ear tinnitus and pruritus of the external auditory canals. The tinnitus remains unchanged. She also describes intermittent periauricular pain, which she distinguishes from her tinnitus and attributes to hearing loss.   She reports ongoing nasal drainage and congestion. Daily use of ipratropium bromide  nasal spray reduces the severity of her rhinorrhea, but she notes significant watery nasal discharge if she omits the spray. Resuming her allergy medication over the past two days has improved both nasal drainage and sleep. She had previously not been taking the allergy medication regularly.  She has a history of nasal mucosal congestion, nasal septal deviation, and bilateral inferior turbinate hypertrophy.  She experiences chronic pruritus and dryness of the ear canals, worse on the left side. She lost her prescription for topical steroid cream and has been using Vaseline at night for symptomatic relief. She also has recurrent bilateral cerumen impaction and possesses ear drops for cerumen softening, though she has not used them recently.  Exam: General: Communicates without difficulty, well nourished, no acute distress. Head: Normocephalic, no evidence injury, no tenderness, facial buttresses intact without stepoff. Face/sinus: No tenderness to palpation and percussion. Facial movement is normal and symmetric. Eyes: PERRL, EOMI. No scleral icterus, conjunctivae clear. Neuro: CN II exam reveals vision grossly intact.  No nystagmus at any point of gaze. EAC: Bilateral cerumen impaction.  Under the operating microscope, the cerumen is carefully removed with a  combination of cerumen currette, alligator forceps, and suction catheters.  After the cerumen is removed, the TMs are noted to be normal. Nose: External evaluation reveals normal support and skin without lesions.  Dorsum is intact.  Anterior rhinoscopy reveals congested mucosa over anterior aspect of inferior turbinates and deviated septum.  Oral:  Oral cavity and oropharynx are intact, symmetric, without erythema or edema.  Mucosa is moist without lesions. Neck: Full range of motion without pain.  There is no significant lymphadenopathy.  No masses palpable.  Thyroid  bed within normal limits to palpation.  Parotid glands and submandibular glands equal bilaterally without mass.  Trachea is midline. Neuro:  CN 2-12 grossly intact. Gait normal.    Procedure: Bilateral cerumen disimpaction Anesthesia: None Description: Under the operating microscope, the cerumen is carefully removed with a combination of cerumen currette, alligator forceps, and suction catheters.  After the cerumen is removed, the TMs are noted to be normal.  Eczematous changes are noted within the left ear canal.  The patient tolerated the procedure well.  Assessment & Plan Chronic eczematous left otitis externa Persistent itching in the left ear due to chronic eczematous otitis externa, exacerbated by the absence of Elocon  cream use due to a lost prescription. The condition causes significant discomfort. - Send electronic prescription for Elocon  cream to [facility]. - Advise application of Elocon  cream to reduce itchiness.  Bilateral cerumen impaction Recurrent cerumen impaction in both ears, contributing to discomfort and potential hearing impairment.  - Otomicroscopy with bilateral cerumen disimpaction.  Chronic rhinitis Ongoing nasal drainage and congestion despite regular Atrovent  nasal spray use. Allergy medication improves symptoms and aids sleep.  - Send prescription for Atrovent  nasal spray to [facility]. -  Advise continued use of Atrovent  nasal  spray and allergy medication to manage symptoms.  Bilateral high frequency sensorineural hearing loss Experiences tinnitus associated with hearing loss. Under neurologist care for related pain issues.  - Schedule follow-up appointment in six months.

## 2025-01-10 ENCOUNTER — Inpatient Hospital Stay: Attending: Adult Health | Admitting: Hematology and Oncology

## 2025-01-10 VITALS — BP 122/60 | HR 62 | Temp 98.4°F | Resp 18 | Ht 62.0 in | Wt 152.5 lb

## 2025-01-10 DIAGNOSIS — C50411 Malignant neoplasm of upper-outer quadrant of right female breast: Secondary | ICD-10-CM | POA: Diagnosis not present

## 2025-01-10 DIAGNOSIS — Z170 Estrogen receptor positive status [ER+]: Secondary | ICD-10-CM | POA: Diagnosis not present

## 2025-01-10 NOTE — Progress Notes (Signed)
 "  Patient Care Team: Shlomo Darryle BROCKS, DO as PCP - General (Family Medicine) Dann Candyce RAMAN, MD as PCP - Cardiology (Cardiology) Odean Potts, MD as Consulting Physician (Hematology and Oncology) Dewey Rush, MD as Consulting Physician (Radiation Oncology) Curvin Deward MOULD, MD as Consulting Physician (General Surgery)  DIAGNOSIS:  Encounter Diagnosis  Name Primary?   Malignant neoplasm of upper-outer quadrant of right breast in female, estrogen receptor positive (HCC) Yes    SUMMARY OF ONCOLOGIC HISTORY: Oncology History  Malignant neoplasm of upper-outer quadrant of right breast in female, estrogen receptor positive (HCC)  12/24/2023 Initial Diagnosis   Screening mammogram detected right upper outer quadrant mass 1 cm by ultrasound at 10 o'clock position.  Axilla negative.  Biopsy: Grade 1 IDC ER 100%, PR 100%, Ki67 10%, HER2 negative by IHC   01/04/2024 Cancer Staging   Staging form: Breast, AJCC 8th Edition - Clinical stage from 01/04/2024: Stage IA (cT1b, cN0, cM0, G1, ER+, PR+, HER2-) - Signed by Lanell Donald Stagger, PA-C on 01/04/2024 Stage prefix: Initial diagnosis Method of lymph node assessment: Clinical Histologic grading system: 3 grade system   01/15/2024 Surgery   Right lumpectomy: 1.6 cm grade 1 IDC with focal high-grade DCIS, margins negative, ER 100%, PR 100%, HER2 1+, Ki-67 10%   01/20/2024 Genetic Testing   Negative [facility] CancerNext-Expanded +RNAinsight Panel.  Report date is 01/20/2024.  The CancerNext-Expanded gene panel offered by [facility] and includes sequencing, rearrangement, and RNA analysis for the following 76 genes: AIP, ALK, APC, ATM, AXIN2, BAP1, BARD1, BMPR1A, BRCA1, BRCA2, BRIP1, CDC73, CDH1, CDK4, CDKN1B, CDKN2A, CEBPA, CHEK2, CTNNA1, DDX41, DICER1, ETV6, FH, FLCN, GATA2, LZTR1, MAX, MBD4, MEN1, MET, MLH1, MSH2, MSH3, MSH6, MUTYH, NF1, NF2, NTHL1, PALB2, PHOX2B, PMS2, POT1, PRKAR1A, PTCH1, PTEN, RAD51C, RAD51D, RB1, RET, RUNX1, SDHA, SDHAF2,  SDHB, SDHC, SDHD, SMAD4, SMARCA4, SMARCB1, SMARCE1, STK11, SUFU, TMEM127, TP53, TSC1, TSC2, VHL, and WT1 (sequencing and deletion/duplication); EGFR, HOXB13, KIT, MITF, PDGFRA, POLD1, and POLE (sequencing only); EPCAM and GREM1 (deletion/duplication only).    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  She is on letrozole  after prior switch from anastrozole  due to side effects and tolerates it without hot flashes, arthralgias, or other systemic adverse effects. She has mild, persistent tenderness localized to the right breast near the surgical scar, described as hard but transient, resolving within an hour after application of baby oil post-shower. She denies new breast masses, significant pain, or nipple discharge. Recent mammogram and targeted ultrasound were normal.  She has neck pain managed by her neurologist with analgesics, thought to be migraine-related or muscular. She denies weakness, numbness, or generalized musculoskeletal symptoms.  Bone density in May of last year showed osteopenia. She takes a multivitamin that likely contains vitamin D but is unsure  of the dose and was advised to ensure adequate  supplementation.      ALLERGIES:  is allergic to darvocet [propoxyphene n-acetaminophen ], orange fruit [citrus], vicodin [hydrocodone -acetaminophen ], dust mite extract, betamethasone, celecoxib, morphine , oxycodone-acetaminophen , wound dressing adhesive, glimepiride, penicillins, and sulfa antibiotics.  MEDICATIONS:  Current Outpatient Medications  Medication Sig Dispense Refill   acetaminophen  (TYLENOL ) 500 MG tablet Take 500 mg by mouth as needed for mild pain (pain score 1-3) or moderate pain (pain score 4-6).     albuterol (VENTOLIN HFA) 108 (90 Base) MCG/ACT inhaler Inhale 2 puffs into the lungs every 4 (four) hours as needed for wheezing or shortness of breath.     cyclobenzaprine (FLEXERIL) 5 MG tablet Take 5 mg by mouth 3 (three) times daily as needed.     diphenhydrAMINE  (BENADRYL ) 25 MG tablet Take 25 mg by mouth every 6 (six) hours as needed for itching.     ipratropium (ATROVENT ) 0.06 % nasal spray Place 2 sprays into both nostrils 2 (two) times daily as needed (nasal drainage). 15 mL 12   ketorolac  (ACULAR ) 0.5 % ophthalmic solution INSTILL 1 DROP INTO LEFT EYE 4 TIMES DAILY 5 mL 0   latanoprost (XALATAN) 0.005 % ophthalmic solution Place 1 drop into both eyes daily.     letrozole  (FEMARA ) 2.5 MG tablet Take 1 tablet (2.5 mg total) by mouth daily. 90 tablet 3   levothyroxine  (SYNTHROID ) 50 MCG tablet Take 50 mcg by mouth daily.     lisinopril  (ZESTRIL ) 10 MG tablet Take 10 mg by mouth daily.     mometasone  (ELOCON ) 0.1 % cream Apply topically daily as needed for itch 15 g 3   Multiple Vitamin (MULTIVITAMIN WITH MINERALS) TABS tablet Take 1 tablet by mouth every other day. Gummy bears/ Unknown strength     nitroGLYCERIN  (NITROSTAT ) 0.4 MG SL tablet Place 0.4 mg under the tongue every 5 (five) minutes as needed for chest pain.     pantoprazole  (PROTONIX ) 40 MG tablet Take 1 tablet by mouth daily.     rosuvastatin  (CRESTOR ) 20 MG tablet Take 20 mg by mouth daily.     timolol   (BETIMOL ) 0.25 % ophthalmic solution 1-2 drops 2 (two) times daily.     traMADol  (ULTRAM ) 50 MG tablet Take 1 tablet (50 mg total) by mouth every 6 (six) hours as needed. 20 tablet 0   triamterene -hydrochlorothiazide (MAXZIDE) 75-50 MG tablet Take 1 tablet by mouth daily.     No current facility-administered medications for this visit.    PHYSICAL EXAMINATION: ECOG PERFORMANCE STATUS: 1 - Symptomatic but completely ambulatory  Vitals:   01/10/25 1413  BP: 122/60  Pulse: 62  Resp: 18  Temp: 98.4 F (36.9 C)  SpO2: 100%   Filed Weights   01/10/25 1413  Weight: 152 lb 8 oz (69.2 kg)      LABORATORY DATA:  I have reviewed the data as listed    Latest Ref Rng & Units 07/07/2024    1:21 PM 01/12/2024    3:06 PM 01/06/2024   12:26 PM  CMP  Glucose 70 - 99 mg/dL 84  93  892   BUN 8 - 23 mg/dL 15  7  8    Creatinine 0.44 - 1.00 mg/dL 8.89  8.98  9.00   Sodium 135 - 145 mmol/L 139  135  135   Potassium 3.5 - 5.1 mmol/L 3.8  3.9  3.3   Chloride 98 - 111 mmol/L 102  98  97   CO2 22 - 32 mmol/L  33  28  29   Calcium  8.9 - 10.3 mg/dL 9.5  8.9  9.9   Total Protein 6.5 - 8.1 g/dL 7.2   7.6   Total Bilirubin 0.0 - 1.2 mg/dL 0.9   1.1   Alkaline Phos 38 - 126 U/L 50   53   AST 15 - 41 U/L 20   24   ALT 0 - 44 U/L 16   18     Lab Results  Component Value Date   WBC 4.2 07/07/2024   HGB 12.5 07/07/2024   HCT 36.1 07/07/2024   MCV 80.0 07/07/2024   PLT 189 07/07/2024   NEUTROABS 3.0 07/07/2024    ASSESSMENT & PLAN:  Malignant neoplasm of upper-outer quadrant of right breast in female, estrogen receptor positive (HCC) 12/24/2023:Screening mammogram detected right upper outer quadrant mass 1 cm by ultrasound at 10 o'clock position.  Axilla negative.  Biopsy: Grade 1 IDC ER 100%, PR 100%, Ki67 10%, HER2 negative by IHC    01/15/2024: Right lumpectomy: 1.6 cm grade 1 IDC with focal high-grade DCIS, margins negative, ER 100%, PR 100%, HER2 1+, Ki-67 10% Oncotype DX: 28 (distant recurrence  at 9 years: 17%)   Recommendations: 1.  Adjuvant chemotherapy with Taxotere and Cytoxan every 3 weeks x 4 cycles: Patient declined 2. Adjuvant radiation therapy completed 03/31/2024 3. Adjuvant antiestrogen therapy with anastrozole  1 mg daily started 04/07/2024 switched to letrozole  ------------------------------------------------------------------------------------------------------- Letrozole  toxicities: Tolerating letrozole  extremely well.  Does not have any hot flashes or joint aches or pains.  Breast cancer surveillance: Breast exam 01/10/2025: Benign, slight breast tenderness in the right breast surgical scar Mammogram and ultrasound 09/23/2024 at [facility]: No abnormality at the site of patient's focal pain in the right axilla: Benign, breast density category B  Return to clinic in 1 year for follow-up   No orders of the defined types were placed in this encounter.  The patient has a good understanding of the overall plan. she agrees with it. she will call with any problems that may develop before the next visit here.  I personally spent a total of 30 minutes in the care of the patient today including preparing to see the patient, getting/reviewing separately obtained history, performing a medically appropriate exam/evaluation, counseling and educating, placing orders, referring and communicating with other health care professionals, documenting clinical information in the EHR, independently interpreting results, communicating results, and coordinating care.   Dr.Jameria Bradway 01/10/25    "

## 2025-01-25 ENCOUNTER — Ambulatory Visit: Admitting: Cardiology

## 2025-06-15 ENCOUNTER — Ambulatory Visit (INDEPENDENT_AMBULATORY_CARE_PROVIDER_SITE_OTHER): Admitting: Otolaryngology

## 2026-01-10 ENCOUNTER — Inpatient Hospital Stay: Attending: Adult Health | Admitting: Hematology and Oncology
